# Patient Record
Sex: Female | Born: 1955
Health system: Southern US, Community
[De-identification: ages and names within clinical notes are randomized; demographics above are authoritative.]

## PROBLEM LIST (undated history)

## (undated) DIAGNOSIS — I471 Supraventricular tachycardia, unspecified: Secondary | ICD-10-CM

## (undated) DIAGNOSIS — Z8679 Personal history of other diseases of the circulatory system: Secondary | ICD-10-CM

## (undated) DIAGNOSIS — E785 Hyperlipidemia, unspecified: Secondary | ICD-10-CM

## (undated) HISTORY — DX: Personal history of other diseases of the circulatory system: Z86.79

## (undated) HISTORY — PX: OTHER SURGICAL HISTORY: SHX169

## (undated) HISTORY — DX: Supraventricular tachycardia: I47.1

## (undated) HISTORY — PX: AUGMENTATION MAMMAPLASTY: SUR837

## (undated) HISTORY — PX: OVARIAN CYST REMOVAL: SHX89

## (undated) HISTORY — PX: TONSILLECTOMY: SUR1361

## (undated) HISTORY — DX: Hyperlipidemia, unspecified: E78.5

## (undated) HISTORY — PX: BREAST ENHANCEMENT SURGERY: SHX7

## (undated) HISTORY — PX: APPENDECTOMY: SHX54

## (undated) HISTORY — DX: Supraventricular tachycardia, unspecified: I47.10

---

## 2005-11-05 ENCOUNTER — Other Ambulatory Visit: Payer: Self-pay

## 2005-11-05 ENCOUNTER — Observation Stay: Payer: Self-pay | Admitting: Internal Medicine

## 2006-02-21 ENCOUNTER — Ambulatory Visit: Payer: Self-pay | Admitting: Gastroenterology

## 2007-08-21 ENCOUNTER — Other Ambulatory Visit: Payer: Self-pay

## 2007-08-21 ENCOUNTER — Inpatient Hospital Stay: Payer: Self-pay | Admitting: Unknown Physician Specialty

## 2008-01-07 ENCOUNTER — Ambulatory Visit: Payer: Self-pay | Admitting: Unknown Physician Specialty

## 2009-03-07 ENCOUNTER — Ambulatory Visit: Payer: Self-pay | Admitting: Unknown Physician Specialty

## 2010-03-08 ENCOUNTER — Ambulatory Visit: Payer: Self-pay | Admitting: Unknown Physician Specialty

## 2011-05-23 ENCOUNTER — Ambulatory Visit: Payer: Self-pay | Admitting: Unknown Physician Specialty

## 2011-06-22 LAB — HM MAMMOGRAPHY: HM Mammogram: NORMAL

## 2011-08-20 ENCOUNTER — Encounter: Payer: Self-pay | Admitting: Internal Medicine

## 2011-08-20 ENCOUNTER — Ambulatory Visit (INDEPENDENT_AMBULATORY_CARE_PROVIDER_SITE_OTHER): Payer: BC Managed Care – PPO | Admitting: Internal Medicine

## 2011-08-20 DIAGNOSIS — E785 Hyperlipidemia, unspecified: Secondary | ICD-10-CM

## 2011-08-20 DIAGNOSIS — A09 Infectious gastroenteritis and colitis, unspecified: Secondary | ICD-10-CM

## 2011-08-20 DIAGNOSIS — R197 Diarrhea, unspecified: Secondary | ICD-10-CM

## 2011-08-20 DIAGNOSIS — R5383 Other fatigue: Secondary | ICD-10-CM

## 2011-08-20 DIAGNOSIS — R5381 Other malaise: Secondary | ICD-10-CM

## 2011-08-20 DIAGNOSIS — E860 Dehydration: Secondary | ICD-10-CM | POA: Insufficient documentation

## 2011-08-20 MED ORDER — AZITHROMYCIN 500 MG PO TABS: 500.0000 mg | ORAL_TABLET | Freq: Every day | ORAL | Status: DC

## 2011-08-20 MED ORDER — CIPROFLOXACIN HCL 500 MG PO TABS: 500.0000 mg | ORAL_TABLET | Freq: Two times a day (BID) | ORAL | Status: AC

## 2011-08-20 NOTE — Patient Instructions (Addendum)
Take the azithromycin if you develop another upper respiratory infection;  The ciprfloxacin if you develop signs of a urinary tract infection  Or diarrhea while on your trip  Consider a trial of Sudafed PE  10 to 30 mg every 6 hours for the next few days to decreased the congestion in your eustachian tubes and managed the innher ear symptoms you are currently having  You appear to be dehydrated; I recommend trying G2 (the blue, emerald green and red varieties are palatable)

## 2011-08-20 NOTE — Assessment & Plan Note (Signed)
Multiple etiologies discussed.  Checking TSH, CBC, CMET.  Discussed issue of depression as cause.

## 2011-08-20 NOTE — Progress Notes (Signed)
Subjective:    Patient ID: Melody Hansen, female    DOB: August 01, 1955, 56 y.o.   MRN: [id number]  HPI new patient .  Treated for pharyngitis 3 weeks ago by Elenore Rota initially with azithromycin followed by bronchitis/sinusitis with purulent secretions which resolved after a second course of antibioitcs,  Not sure which one was used.  After the 3 week illness she  recovered for one week, then developed GI illness with abdominal cramping accompanied by by loose grey stools initially 3 per day, with With subjective fevers and chills.  Diarrhea has now resolved after 5 to 6 days,  None for the past 24 hrs.  Has had yogurt, toast and peanut butter,  Some egg drop soup.  Melody Hansen was also recently sick with projectile vomiting , which she had none.  This is the First year she has had an influenza vaccine.  History of tonsillectomy.   Planning on going to [place] on Sunday and wants to make sure her GI issues are resolved . 2nd issue is right groin pain radiating to inner thigh .  No lower back pain ,  Present for about one week.  Aggravated  by sitting,  Lying on side  ,  But not aggravated by phsycial activities.   3rd issue is fatigue , which she feels predates her recent illnesses.  No history of diabetes, thyroid or anemia. Menopause at age 74.  No sleep disruption except once per night.  /  Colonoscopy at age 27 was normal.  Mammogram just done.  Was taking once weekly fosomax for osteopenia but stopped due to headaches. DEXAs were done at [facility].  Father died in 2023/03/15 and she was primary caregiver.    Past Medical History  Diagnosis Date  . Hyperlipidemia     familial  . Atrial fibrillation     Review of Systems  Constitutional: Positive for fatigue. Negative for fever, chills and unexpected weight change.  HENT: Negative for hearing loss, ear pain, nosebleeds, congestion, sore throat, facial swelling, rhinorrhea, sneezing, mouth sores, trouble swallowing, neck pain, neck stiffness, voice change,  postnasal drip, sinus pressure, tinnitus and ear discharge.   Eyes: Negative for pain, discharge, redness and visual disturbance.  Respiratory: Negative for cough, chest tightness, shortness of breath, wheezing and stridor.   Cardiovascular: Negative for chest pain, palpitations and leg swelling.  Gastrointestinal: Positive for diarrhea.  Musculoskeletal: Negative for myalgias and arthralgias.  Skin: Negative for color change and rash.  Neurological: Negative for dizziness, weakness, light-headedness and headaches.  Hematological: Negative for adenopathy.       Objective:   Physical Exam  Constitutional: She is oriented to person, place, and time. She appears well-developed and well-nourished.  HENT:  Mouth/Throat: Oropharynx is clear and moist.  Eyes: EOM are normal. Pupils are equal, round, and reactive to light. No scleral icterus.  Neck: Normal range of motion. Neck supple. No JVD present. No thyromegaly present.  Cardiovascular: Normal rate, regular rhythm, normal heart sounds and intact distal pulses.   Pulmonary/Chest: Effort normal and breath sounds normal.  Abdominal: Soft. Bowel sounds are normal. She exhibits no mass. There is no tenderness.  Musculoskeletal: Normal range of motion. She exhibits no edema.  Lymphadenopathy:    She has no cervical adenopathy.  Neurological: She is alert and oriented to person, place, and time.  Skin: Skin is warm and dry.  Psychiatric: She has a normal mood and affect.      Assessment & Plan:   Fatigue Multiple etiologies discussed.  Checking  TSH, CBC, CMET.  Discussed issue of depression as cause.   Dehydration By orthostatic vital signs, due to recent diarrheal illness.  Recommended G2 as rehydration .    Diarrhea in adult patient Presumed infectious, possible c dificile given prior use of antibiotics.  Now resolved x 24 hours, sent home with stool culture vials and c dificile  vial for testing.     Updated Medication  List Outpatient Encounter Prescriptions as of 08/20/2011  Medication Sig Dispense Refill  . atorvastatin (LIPITOR) 10 MG tablet Take 10 mg by mouth daily.      Marland Kitchen diltiazem (CARDIZEM) 60 MG tablet Take 30 mg by mouth as needed.      Marland Kitchen azithromycin (ZITHROMAX) 500 MG tablet Take 1 tablet (500 mg total) by mouth daily.  7 tablet  0  . ciprofloxacin (CIPRO) 500 MG tablet Take 1 tablet (500 mg total) by mouth 2 (two) times daily.  14 tablet  0

## 2011-08-20 NOTE — Assessment & Plan Note (Signed)
By orthostatic vital signs, due to recent diarrheal illness.  Recommended G2 as rehydration .

## 2011-08-20 NOTE — Assessment & Plan Note (Signed)
Presumed infectious, possible c dificile given prior use of antibiotics.  Now resolved x 24 hours, sent home with stool culture vials and c dificile  vial for testing.

## 2011-08-21 LAB — COMPLETE METABOLIC PANEL WITH GFR
BUN: 15 mg/dL (ref 6–23)
CO2: 27 mEq/L (ref 19–32)
Calcium: 9.7 mg/dL (ref 8.4–10.5)
Chloride: 105 mEq/L (ref 96–112)
Creat: 0.51 mg/dL (ref 0.50–1.10)
GFR, Est African American: >89 mL/min
GFR, Est Non African American: >89 mL/min
Glucose, Bld: 93 mg/dL (ref 70–99)
Total Bilirubin: 0.4 mg/dL (ref 0.3–1.2)

## 2011-08-21 LAB — CBC WITH DIFFERENTIAL/PLATELET
Basophils Absolute: 0 10*3/uL (ref 0.0–0.1)
Eosinophils Relative: 2.2 % (ref 0.0–5.0)
HCT: 35.1 % — ABNORMAL LOW (ref 36.0–46.0)
Lymphs Abs: 1.6 10*3/uL (ref 0.7–4.0)
Monocytes Absolute: 0.3 10*3/uL (ref 0.1–1.0)
Monocytes Relative: 7.2 % (ref 3.0–12.0)
Neutrophils Relative %: 53.5 % (ref 43.0–77.0)
Platelets: 180 10*3/uL (ref 150.0–400.0)
RDW: 13.5 % (ref 11.5–14.6)
WBC: 4.4 10*3/uL — ABNORMAL LOW (ref 4.5–10.5)

## 2011-08-21 LAB — TSH: TSH: 0.55 u[IU]/mL (ref 0.35–5.50)

## 2011-08-22 NOTE — Progress Notes (Signed)
Addended by: Darletta Moll A on: 08/22/2011 10:57 AM   Modules accepted: Orders

## 2011-09-02 ENCOUNTER — Encounter: Payer: Self-pay | Admitting: Internal Medicine

## 2011-09-12 ENCOUNTER — Ambulatory Visit: Payer: Self-pay | Admitting: Internal Medicine

## 2011-09-30 ENCOUNTER — Emergency Department: Payer: Self-pay | Admitting: Emergency Medicine

## 2011-09-30 LAB — COMPREHENSIVE METABOLIC PANEL
Albumin: 4.2 g/dL (ref 3.4–5.0)
Alkaline Phosphatase: 57 U/L (ref 50–136)
Anion Gap: 9 (ref 7–16)
BUN: 13 mg/dL (ref 7–18)
Calcium, Total: 9 mg/dL (ref 8.5–10.1)
EGFR (African American): >60
EGFR (Non-African Amer.): >60
Glucose: 88 mg/dL (ref 65–99)
Osmolality: 286 (ref 275–301)
Potassium: 4 mmol/L (ref 3.5–5.1)
SGOT(AST): 24 U/L (ref 15–37)
SGPT (ALT): 25 U/L
Sodium: 144 mmol/L (ref 136–145)
Total Protein: 7 g/dL (ref 6.4–8.2)

## 2011-09-30 LAB — CK TOTAL AND CKMB (NOT AT ARMC)
CK, Total: 152 U/L (ref 21–215)
CK-MB: 6.3 ng/mL — ABNORMAL HIGH (ref 0.5–3.6)

## 2011-09-30 LAB — APTT: Activated PTT: 31.9 secs (ref 23.6–35.9)

## 2011-09-30 LAB — CBC
HCT: 36.6 % (ref 35.0–47.0)
MCHC: 33.3 g/dL (ref 32.0–36.0)
Platelet: 213 10*3/uL (ref 150–440)

## 2011-09-30 LAB — TROPONIN I
Troponin-I: 0.05 ng/mL
Troponin-I: 0.1 ng/mL — ABNORMAL HIGH

## 2011-10-01 ENCOUNTER — Ambulatory Visit (INDEPENDENT_AMBULATORY_CARE_PROVIDER_SITE_OTHER): Payer: BC Managed Care – PPO | Admitting: Cardiovascular Disease

## 2011-10-01 ENCOUNTER — Encounter: Payer: Self-pay | Admitting: Cardiovascular Disease

## 2011-10-01 VITALS — BP 108/72 | HR 77 | Ht 64.0 in | Wt 129.0 lb

## 2011-10-01 DIAGNOSIS — I4891 Unspecified atrial fibrillation: Secondary | ICD-10-CM

## 2011-10-01 DIAGNOSIS — R079 Chest pain, unspecified: Secondary | ICD-10-CM | POA: Insufficient documentation

## 2011-10-01 DIAGNOSIS — I471 Supraventricular tachycardia: Secondary | ICD-10-CM | POA: Insufficient documentation

## 2011-10-01 NOTE — Assessment & Plan Note (Signed)
The patient had an episode of supraventricular tachycardia yesterday which required an emergency room visit and termination with adenosine. Her episodes of SVT have been clear overall and an average she gets one prolonged episode every 2 years that requires termination with adenosine. She gets other minor episodes but these usually are self-limiting and did not require emergency room visits. 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 We discussed different management options including radiofrequency ablation, antiarrhythmic medications and possible observation. She does use diltiazem as needed which occasionally works but not all the time. She cannot take this medication on a regular basis due to her baseline hypotension. Given that her episodes are overall greater and not frequent, ablation and antiarrhythmic medications are likely not warranted at this time given that the risks likely outweigh the benefit. If her episodes become more frequent, I think ablation would be the better option. For now, I recommend an echocardiogram to make sure she does not have any structural heart abnormalities. One option in her situation would be trying "the pill in the pocket" approach with flecainide or propafenone. This is usually used for paroxysmal atrial fibrillation but does also work for SVT. We also discussed today the different vagal maneuver that can be attempted.

## 2011-10-01 NOTE — Assessment & Plan Note (Signed)
She had some chest discomfort yesterday in the setting of tachycardia with borderline elevated troponin. I suspect that was related to supply demand ischemia. However, she has family history of premature CAD. Thus, I recommend further evaluation with a treadmill stress test.

## 2011-10-01 NOTE — Progress Notes (Signed)
HPI  This is a very pleasant 56 year old female who is here today for urgent evaluation after an emergency room visit last evening. She has known history of paroxysmal supraventricular tachycardia which was diagnosed about 6 years ago. Since that time, she had 3 emergency room visits that required termination with adenosine. Her last episode was 2 years ago before presenting yesterday with tachycardia associated with chest discomfort and mild dyspnea. She also had significant pulsatile feeling in her neck. Her heart rate at home was around 165 beats per minute. In the emergency room her ECG showed narrow complex tachycardia with a heart rate of 158 beats per minute with nonspecific ST changes. She was given 6 mg of IV adenosine and converted to normal sinus rhythm. Her chest x-ray was unremarkable. Her initial troponin was negative but the second one was 0.05 which is basically the upper limits of normal. She does get occasional fluttering sensation in her heart which lasts only for a few minutes. These episodes happen an average of once every one or 2 months. She denies any excessive caffeine intake. 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 She does have family history of premature coronary artery disease. She did discuss her condition with Dr. Beverely Pace in [place] who is her brother-in-law.  Allergies  Allergen Reactions  . Penicillins Hives     Current Outpatient Prescriptions on File Prior to Visit  Medication Sig Dispense Refill  . atorvastatin (LIPITOR) 10 MG tablet Take 10 mg by mouth daily.      Marland Kitchen diltiazem (CARDIZEM) 60 MG tablet Take 30 mg by mouth as needed.         Past Medical History  Diagnosis Date  . Hyperlipidemia     familial  . Osteoporosis   . Hx of supraventricular tachycardia   . PSVT (paroxysmal supraventricular tachycardia)      Past Surgical History  Procedure Date  . Tonsillectomy   . Appendectomy   . Ovarian cyst removal   . Removal  calcification right sternoclavicular joint   . Breast enhancement surgery      Family History  Problem Relation Age of Onset  . Stroke Father   . Heart disease Father   . Hyperlipidemia Father   . Heart attack Mother   . Hyperlipidemia      family history     History   Social History  . Marital Status: Married    Spouse Name: N/A    Number of Children: N/A  . Years of Education: N/A   Occupational History  . Not on file.   Social History Main Topics  . Smoking status: Never Smoker   . Smokeless tobacco: Never Used  . Alcohol Use: Yes  . Drug Use: No  . Sexually Active: Not on file   Other Topics Concern  . Not on file   Social History Narrative  . No narrative on file     ROS Constitutional: Negative for fever, chills, diaphoresis, activity change, appetite change and fatigue.  HENT: Negative for hearing loss, nosebleeds, congestion, sore throat, facial swelling, drooling, trouble swallowing, neck pain, voice change, sinus pressure and tinnitus.  Eyes: Negative for photophobia, pain, discharge and visual disturbance.  Respiratory: Negative for apnea wheezing.  Cardiovascular: Negative for  leg swelling.  Gastrointestinal: Negative for nausea, vomiting, abdominal pain, diarrhea, constipation, blood in stool and abdominal distention.  Genitourinary: Negative for dysuria, urgency, frequency, hematuria and decreased urine volume.  Musculoskeletal: Negative for myalgias, back pain,  joint swelling, arthralgias and gait problem.  Skin: Negative for color change, pallor, rash and wound.  Neurological: Negative for dizziness, tremors, seizures, syncope, speech difficulty, weakness, light-headedness, numbness and headaches.  Psychiatric/Behavioral: Negative for suicidal ideas, hallucinations, behavioral problems and agitation. The patient is not nervous/anxious.     PHYSICAL EXAM   BP 108/72  Pulse 77  Ht 5\' 4"  (1.626 m)  Wt 129 lb (58.514 kg)  BMI 22.14  kg/m2  Constitutional: She is oriented to person, place, and time. She appears well-developed and well-nourished. No distress.  HENT: No nasal discharge.  Head: Normocephalic and atraumatic.  Eyes: Pupils are equal and round. Right eye exhibits no discharge. Left eye exhibits no discharge.  Neck: Normal range of motion. Neck supple. No JVD present. No thyromegaly present.  Cardiovascular: Normal rate, regular rhythm, normal heart sounds. Exam reveals no gallop and no friction rub. No murmur heard.  Pulmonary/Chest: Effort normal and breath sounds normal. No stridor. No respiratory distress. She has no wheezes. She has no rales. She exhibits no tenderness.  Abdominal: Soft. Bowel sounds are normal. She exhibits no distension. There is no tenderness. There is no rebound and no guarding.  Musculoskeletal: Normal range of motion. She exhibits no edema and no tenderness.  Neurological: She is alert and oriented to person, place, and time. Coordination normal.  Skin: Skin is warm and dry. No rash noted. She is not diaphoretic. No erythema. No pallor.  Psychiatric: She has a normal mood and affect. Her behavior is normal. Judgment and thought content normal.     EKG: Normal sinus rhythm, possible right atrial enlargement. Normal PR and QT intervals. RSR' in V1 and V2.   ASSESSMENT AND PLAN

## 2011-10-01 NOTE — Patient Instructions (Signed)
Need to have a GXT in the [place] office.  Your physician has requested that you have an echocardiogram. Echocardiography is a painless test that uses sound waves to create images of your heart. It provides your doctor with information about the size and shape of your heart and how well your heart's chambers and valves are working. This procedure takes approximately one hour. There are no restrictions for this procedure.  Need to have Echo in the [place] office.   Follow up after the test with Dr. Kirke Corin.

## 2011-10-04 ENCOUNTER — Encounter: Payer: Self-pay | Admitting: Cardiovascular Disease

## 2011-10-04 ENCOUNTER — Ambulatory Visit (INDEPENDENT_AMBULATORY_CARE_PROVIDER_SITE_OTHER): Payer: BC Managed Care – PPO | Admitting: Cardiovascular Disease

## 2011-10-04 VITALS — BP 102/62 | Ht 64.0 in | Wt 128.0 lb

## 2011-10-04 DIAGNOSIS — I4891 Unspecified atrial fibrillation: Secondary | ICD-10-CM

## 2011-10-04 NOTE — Progress Notes (Signed)
    Treadmill Stress test  Indication: atypical chest pain.  Baseline Data:  Resting EKG shows NSR with rate of 64 bpm, no significant ST or T wave changes. Resting blood pressure of 102/62 mm Hg Stand bruce protocal was used.  Exercise Data:  Patient exercised for 9 min 0 sec,  Peak heart rate of 149 bpm.  This was 92 % of the maximum predicted heart rate. No symptoms of chest pain or lightheadedness were reported at peak stress or in recovery.  Peak Blood pressure recorded was 138/74 Maximal work level: 10.1 METs.  Heart rate at 3 minutes in recovery was 86 bpm. BP response: Normal HR response: Normal  EKG with Exercise: Sinus tachycardia with no significant ST changes  FINAL IMPRESSION: Normal exercise stress test. No significant EKG changes concerning for ischemia. Very good exercise tolerance. Low risk Duke treadmill score of 9.  No evidence of exercise induced arrhythmia.

## 2011-10-08 ENCOUNTER — Other Ambulatory Visit: Payer: Self-pay

## 2011-10-08 ENCOUNTER — Other Ambulatory Visit (INDEPENDENT_AMBULATORY_CARE_PROVIDER_SITE_OTHER): Payer: BC Managed Care – PPO

## 2011-10-08 DIAGNOSIS — I4891 Unspecified atrial fibrillation: Secondary | ICD-10-CM

## 2011-10-08 DIAGNOSIS — I498 Other specified cardiac arrhythmias: Secondary | ICD-10-CM

## 2011-10-15 ENCOUNTER — Ambulatory Visit: Payer: BC Managed Care – PPO | Admitting: Cardiovascular Disease

## 2011-10-22 ENCOUNTER — Encounter: Payer: Self-pay | Admitting: Cardiovascular Disease

## 2011-10-22 ENCOUNTER — Ambulatory Visit (INDEPENDENT_AMBULATORY_CARE_PROVIDER_SITE_OTHER): Payer: BC Managed Care – PPO | Admitting: Cardiovascular Disease

## 2011-10-22 VITALS — BP 104/64 | HR 79 | Ht 64.0 in | Wt 129.8 lb

## 2011-10-22 DIAGNOSIS — I471 Supraventricular tachycardia: Secondary | ICD-10-CM

## 2011-10-22 DIAGNOSIS — R079 Chest pain, unspecified: Secondary | ICD-10-CM

## 2011-10-22 NOTE — Assessment & Plan Note (Signed)
Treadmill stress test was normal. No further chest pain.

## 2011-10-22 NOTE — Progress Notes (Signed)
    HPI  This is a 56 year old female who is here today for a followup visit. She has known history of paroxysmal supraventricular tachycardia. She underwent evaluation with an echocardiogram which basically was normal. She did complain of an atypical chest pain and underwent a treadmill stress test which was also normal and showed no evidence of exercise induced arrhythmia. Overall she is feeling well. She has not had any further episodes of tachycardia. No more chest pain.  Allergies  Allergen Reactions  . Penicillins Hives     Current Outpatient Prescriptions on File Prior to Visit  Medication Sig Dispense Refill  . atorvastatin (LIPITOR) 10 MG tablet Take 10 mg by mouth daily.         Past Medical History  Diagnosis Date  . Hyperlipidemia     familial  . Osteoporosis   . Hx of supraventricular tachycardia   . PSVT (paroxysmal supraventricular tachycardia)      Past Surgical History  Procedure Date  . Tonsillectomy   . Appendectomy   . Ovarian cyst removal   . Removal calcification right sternoclavicular joint   . Breast enhancement surgery      Family History  Problem Relation Age of Onset  . Stroke Father   . Heart disease Father   . Hyperlipidemia Father   . Heart attack Mother   . Hyperlipidemia      family history     History   Social History  . Marital Status: Married    Spouse Name: N/A    Number of Children: N/A  . Years of Education: N/A   Occupational History  . Not on file.   Social History Main Topics  . Smoking status: Never Smoker   . Smokeless tobacco: Never Used  . Alcohol Use: Yes     occassional  . Drug Use: No  . Sexually Active: Not on file   Other Topics Concern  . Not on file   Social History Narrative  . No narrative on file     PHYSICAL EXAM   BP 104/64  Pulse 79  Ht 5\' 4"  (1.626 m)  Wt 129 lb 12 oz (58.854 kg)  BMI 22.27 kg/m2  Constitutional: She is oriented to person, place, and time. She appears  well-developed and well-nourished. No distress.  HENT: No nasal discharge.  Head: Normocephalic and atraumatic.  Eyes: Pupils are equal and round. Right eye exhibits no discharge. Left eye exhibits no discharge.  Neck: Normal range of motion. Neck supple. No JVD present. No thyromegaly present.  Cardiovascular: Normal rate, regular rhythm, normal heart sounds. Exam reveals no gallop and no friction rub. No murmur heard.  Pulmonary/Chest: Effort normal and breath sounds normal. No stridor. No respiratory distress. She has no wheezes. She has no rales. She exhibits no tenderness.  Abdominal: Soft. Bowel sounds are normal. She exhibits no distension. There is no tenderness. There is no rebound and no guarding.  Musculoskeletal: Normal range of motion. She exhibits no edema and no tenderness.  Neurological: She is alert and oriented to person, place, and time. Coordination normal.  Skin: Skin is warm and dry. No rash noted. She is not diaphoretic. No erythema. No pallor.  Psychiatric: She has a normal mood and affect. Her behavior is normal. Judgment and thought content normal.      ASSESSMENT AND PLAN

## 2011-10-22 NOTE — Assessment & Plan Note (Signed)
No further episodes since her last visit. Currently, her episodes are very rare and an average she gets one prolonged episode every 2 years. All of these episodes happened in spring time. At this time I think it's reasonable to continue vagal maneuvers as needed as well as diltiazem as needed. If these episodes become more frequent, an antiarrhythmic medication or ablation can be considered.

## 2011-10-22 NOTE — Patient Instructions (Signed)
Your heart tests were fine.  Follow up in 1 year or earlier if need.

## 2012-03-02 ENCOUNTER — Telehealth: Payer: Self-pay | Admitting: Internal Medicine

## 2012-03-02 ENCOUNTER — Ambulatory Visit (INDEPENDENT_AMBULATORY_CARE_PROVIDER_SITE_OTHER): Payer: BC Managed Care – PPO | Admitting: Internal Medicine

## 2012-03-02 ENCOUNTER — Encounter: Payer: Self-pay | Admitting: Internal Medicine

## 2012-03-02 VITALS — BP 100/60 | HR 80 | Temp 98.5°F | Resp 14 | Wt 131.2 lb

## 2012-03-02 DIAGNOSIS — M546 Pain in thoracic spine: Secondary | ICD-10-CM

## 2012-03-02 MED ORDER — ESOMEPRAZOLE MAGNESIUM 40 MG PO CPDR: 40.0000 mg | DELAYED_RELEASE_CAPSULE | Freq: Every day | ORAL | Status: DC

## 2012-03-02 MED ORDER — DICLOFENAC EPOLAMINE 1.3 % TD PTCH: 1.0000 | MEDICATED_PATCH | Freq: Two times a day (BID) | TRANSDERMAL | Status: DC

## 2012-03-02 NOTE — Progress Notes (Signed)
Patient ID: Melody Hansen, female   DOB: 06-16-1956, 56 y.o.   MRN: [id number]  Patient Active Problem List  Diagnosis  . Hyperlipidemia  . Fatigue  . Dehydration  . Diarrhea in adult patient  . PSVT (paroxysmal supraventricular tachycardia)  . Chest pain  . Back pain, thoracic    Subjective:  CC:   Chief Complaint  Patient presents with  . Abdominal Pain    HPI:   Melody Hansen a 56 y.o. female who presents  With a 2 day history of dull back pain on the left for left rib with occasional radiation to her left lower quadrant. They started after spending 8 hours in the car on Friday and again on Sunday driving to [facility] for her high school reunion. He was preceded by a fall at which time she suffered abrasion to right knee. At her today while dancing. Her pain is aggravated with prolonged sitting is better when she lies down. It is almost like gas pains. She denies any pleuritic component to it. She's noticed any blood in her in her stools or in her urine. She's had no lower extremity edema. She has not tried treating it with ice or heat or rest nor has she tried anti-inflammatory.  Past Medical History  Diagnosis Date  . Hyperlipidemia     familial  . Osteoporosis   . Hx of supraventricular tachycardia   . PSVT (paroxysmal supraventricular tachycardia)     Past Surgical History  Procedure Date  . Tonsillectomy   . Appendectomy   . Ovarian cyst removal   . Removal calcification right sternoclavicular joint   . Breast enhancement surgery          The following portions of the patient's history were reviewed and updated as appropriate: Allergies, current medications, and problem list.    Review of Systems:   A comprehensive ROS was done and positive for back pain .   The rest was negative.   History   Social History  . Marital Status: Married    Spouse Name: N/A    Number of Children: N/A  . Years of Education: N/A   Occupational History  . Not  on file.   Social History Main Topics  . Smoking status: Never Smoker   . Smokeless tobacco: Never Used  . Alcohol Use: Yes     occassional  . Drug Use: No  . Sexually Active: Not on file   Other Topics Concern  . Not on file   Social History Narrative  . No narrative on file    Objective:  BP 100/60  Pulse 80  Temp 98.5 F (36.9 C) (Oral)  Resp 14  Wt 131 lb 4 oz (59.535 kg)  SpO2 94%  General appearance: alert, cooperative and appears stated age Ears: normal TM's and external ear canals both ears Throat: lips, mucosa, and tongue normal; teeth and gums normal Neck: no adenopathy, no carotid bruit, supple, symmetrical, trachea midline and thyroid not enlarged, symmetric, no tenderness/mass/nodules Back: symmetric, no curvature. ROM normal. No CVA tenderness. Lungs: clear to auscultation bilaterally Heart: regular rate and rhythm, S1, S2 normal, no murmur, click, rub or gallop Abdomen: soft, non-tender; bowel sounds normal; no masses,  no organomegaly Pulses: 2+ and symmetric Skin: Skin color, texture, turgor normal. No rashes or lesions Lymph nodes: Cervical, supraclavicular, and axillary nodes normal.  Assessment and Plan:  Back pain, thoracic She has paraspinous muscle spasm on the left side and tenderness to palpation. Suspect her symptoms are  due to muscle strain from combination of abnormal activities including a fall dancing and a prolonged car ride. Trial of anti-inflammatory patch since she has had some gastritis with use of Aleve. Urinalysis was drawn today but was not run would like to rule out kidney stones well.   Updated Medication List Outpatient Encounter Prescriptions as of 03/02/2012  Medication Sig Dispense Refill  . atorvastatin (LIPITOR) 10 MG tablet Take 10 mg by mouth daily.      . diclofenac (FLECTOR) 1.3 % PTCH Place 1 patch onto the skin 2 (two) times daily.  30 patch  0  . esomeprazole (NEXIUM) 40 MG capsule Take 1 capsule (40 mg total) by  mouth daily.  15 capsule  3     Orders Placed This Encounter  Procedures  . Urinalysis    Return in about 1 month (around 04/01/2012).

## 2012-03-02 NOTE — Telephone Encounter (Signed)
Caller: Temia/Patient; Patient Name: Melody Hansen; PCP: Duncan Dull (Adults only); Best Callback Phone Number: [phone]; Reason for call: Abdominal pain on the left side, under her ribs that began on Sunday 9/15 at 2pm,  and persists now. Feels like gas pain. Afebrile. She is able to eat and denies vomiting, diarrhea and has had a bowel movement today. Some nausea after lunch of a salad. Discomfort at 5-6/10 now. Per Abdominal Pain Protocol, New onset constant mild or aching lower abdominal pain, See in 24 hours. RN spoke with Morrie Sheldon at back line re blocked slot at 15:30 with Dr Darrick Huntsman. Slot unblocked and scheduled for this patient for today, Monday 9/16 at 15:30. Caller is agreeable.

## 2012-03-02 NOTE — Patient Instructions (Addendum)
I am prescribing a antiinflammatory patch to place over the painful area on your back.  Change every 12 hours  Take the nexium daily for your stomach.  If yo do to improve in a week, let me know.  This is  Dr. Norton Blizzard version of a  "Low GI"  Diet:  All of the foods can be found at grocery stores and in bulk at [employer].  The Atkins protein bars and shakes are available in more varieties at [employer], [employer] and [employer].     7 AM Breakfast:  Low carbohydrate Protein  Shakes (I recommend the EAS AdvantEdge "Carb Control" shakes  Or the low carb shakes by Atkins.   Both are available everywhere:  In  cases at [employer]Js  Or in 4 packs at grocery stores and pharmacies  2.5 carbs  (Alternative is  a toasted Arnold's Sandwhich Thin w/ peanut butter, a "Bagel Thin" with cream cheese and salmon) or  a scrambled egg burrito made with a low carb tortilla .  Avoid cereal and bananas, oatmeal too unless you are cooking the old fashioned kind that takes 30-40 minutes to prepare.  the rest is overly processed, has minimal fiber, and is loaded with carbohydrates!   10 AM: Protein bar by Atkins (the snack size, under 200 cal).  There are many varieties , available widely again or in bulk in limited varieties at [employer]Js)  Other so called "protein bars" tend to be loaded with carbohydrates.  Remember, in food advertising, the word "energy" is synonymous for " carbohydrate."  Lunch: sandwich of [place], (or any lunchmeat, grilled meat or canned tuna), fresh avocado, mayonnaise  and cheese on a lower carbohydrate pita bread, flatbread, or tortilla . Ok to use regular mayonnaise. The bread is the only source or carbohydrate that can be decreased (Joseph's makes a pita bread and a flat bread that are 50 cal and 4 net carbs ; Toufayan makes a low carb flatbread that's 100 cal and 9 net carbs  and  Mission makes a low carb whole wheat tortilla  That is 210 cal and 6 net carbs)  3 PM:  Mid day :  Another protein bar,  Or a  cheese  stick (100 cal, 0 carbs),  Or 1 ounce of  almonds, walnuts, pistachios, pecans, peanuts,  Macadamia nuts. Or a Dannon light n Fit greek yogurt, 80 cal 8 net carbs . Avoid "granola"; the dried cranberries and raisins are loaded with carbohydrates. Mixed nuts ok if no raisins or cranberries or dried fruit.      6 PM  Dinner:  "mean and green:"  Meat/chicken/fish or a high protein legume; , with a green salad, and a low GI  Veggie (broccoli, cauliflower, green beans, spinach, brussel sprouts. Lima beans) : Avoid "Low fat dressings, as well as Reyne Dumas and [address]! They are loaded with sugar! Instead use ranch, vinagrette,  Blue cheese, etc  9 PM snack : Breyer's "low carb" fudgsicle or  ice cream bar (Carb Smart line), or  Weight Watcher's ice cream bar , or another "no sugar added" ice cream;a serving of fresh berries/cherries with whipped cream (Avoid bananas, pineapple, grapes  and watermelon on a regular basis because they are high in sugar)   Remember that snack Substitutions should be less than 15 to 20 carbs  Per serving. Remember to subtract fiber grams and sugar alcohols to get the "net carbs."

## 2012-03-02 NOTE — Assessment & Plan Note (Signed)
She has paraspinous muscle spasm on the left side and tenderness to palpation. Suspect her symptoms are due to muscle strain from combination of abnormal activities including a fall dancing and a prolonged car ride. Trial of anti-inflammatory patch since she has had some gastritis with use of Aleve. Urinalysis was drawn today but was not run would like to rule out kidney stones well.

## 2012-03-03 LAB — URINALYSIS
Nitrite: NEGATIVE
Specific Gravity, Urine: 1.025 (ref 1.000–1.030)
Total Protein, Urine: NEGATIVE

## 2012-04-07 ENCOUNTER — Ambulatory Visit: Payer: BC Managed Care – PPO | Admitting: Internal Medicine

## 2012-04-07 DIAGNOSIS — Z0289 Encounter for other administrative examinations: Secondary | ICD-10-CM

## 2012-10-12 ENCOUNTER — Ambulatory Visit (INDEPENDENT_AMBULATORY_CARE_PROVIDER_SITE_OTHER): Payer: BC Managed Care – PPO

## 2012-10-12 VITALS — BP 118/72 | HR 72 | Ht 64.0 in | Wt 128.5 lb

## 2012-10-12 DIAGNOSIS — R42 Dizziness and giddiness: Secondary | ICD-10-CM

## 2012-10-12 DIAGNOSIS — I471 Supraventricular tachycardia: Secondary | ICD-10-CM

## 2012-10-12 NOTE — Progress Notes (Signed)
Pt walked in office with c/o "heart fluttering" associated with dizziness. EKG was performed as well as orthostatics.  All values were WNL  Discussed with Dr. Kirke Corin who advised "order 2 week e cardio event monitor for pt and have her f/u after that" VO Dr. Adline Mango, RN  Pt informed Understanding verb

## 2012-10-12 NOTE — Patient Instructions (Addendum)
E Cardio event monitor  F/U in 2 weeks

## 2012-10-26 ENCOUNTER — Telehealth: Payer: Self-pay

## 2012-10-26 ENCOUNTER — Ambulatory Visit: Payer: BC Managed Care – PPO | Admitting: Cardiovascular Disease

## 2012-10-26 NOTE — Telephone Encounter (Signed)
Patient called to cancel appointment today since she is still wearing the Ecardio monitor until Oct 28, 2012. She would like to come when the monitor is finished. She states has not had any episodes and will just continue to monitor her heart and call the office if has any problems before her follow up with Dr. Kirke Corin in June.

## 2012-11-06 ENCOUNTER — Ambulatory Visit (INDEPENDENT_AMBULATORY_CARE_PROVIDER_SITE_OTHER): Payer: BC Managed Care – PPO

## 2012-11-06 ENCOUNTER — Encounter: Payer: Self-pay | Admitting: *Deleted

## 2012-11-06 DIAGNOSIS — I471 Supraventricular tachycardia: Secondary | ICD-10-CM

## 2012-11-06 DIAGNOSIS — R42 Dizziness and giddiness: Secondary | ICD-10-CM

## 2012-11-16 ENCOUNTER — Ambulatory Visit: Payer: BC Managed Care – PPO | Admitting: Cardiovascular Disease

## 2012-11-20 ENCOUNTER — Ambulatory Visit: Payer: BC Managed Care – PPO | Admitting: Cardiovascular Disease

## 2012-12-07 ENCOUNTER — Encounter: Payer: Self-pay | Admitting: Cardiovascular Disease

## 2012-12-07 ENCOUNTER — Ambulatory Visit (INDEPENDENT_AMBULATORY_CARE_PROVIDER_SITE_OTHER): Payer: BC Managed Care – PPO | Admitting: Cardiovascular Disease

## 2012-12-07 VITALS — BP 102/64 | HR 75 | Ht 64.0 in | Wt 127.8 lb

## 2012-12-07 DIAGNOSIS — I471 Supraventricular tachycardia: Secondary | ICD-10-CM

## 2012-12-07 DIAGNOSIS — I4902 Ventricular flutter: Secondary | ICD-10-CM

## 2012-12-07 NOTE — Assessment & Plan Note (Signed)
The patient seems to have episodes of tachycardia during springtime correlating with high allergy season with personal history of significant allergies during that time. Fortunately, these episodes are still not very frequent and not very long in duration. Recent outpatient telemetry showed no evidence of arrhythmia. I recommend continuing observation. No indication to start a medication at this point.

## 2012-12-07 NOTE — Patient Instructions (Addendum)
Follow up as needed

## 2012-12-07 NOTE — Progress Notes (Signed)
HPI  This is a 57 year old female who is here today for a followup visit. She has known history of paroxysmal supraventricular tachycardia. She underwent evaluation with an echocardiogram in 2013 which  was normal. Treadmill stress test was done also due to atypical chest pain which was normal and showed no evidence of exercise induced arrhythmia. She stopped by our office in April due to an episode of palpitations and dizziness. By the time she arrived, she was feeling better and EKG showed normal sinus rhythm. I requested a two-week outpatient telemetry which showed no evidence of arrhythmia.  Allergies  Allergen Reactions  . Penicillins Hives     Current Outpatient Prescriptions on File Prior to Visit  Medication Sig Dispense Refill  . Loratadine-Pseudoephedrine (ALAVERT ALLERGY/SINUS PO) Take by mouth daily.       No current facility-administered medications on file prior to visit.     Past Medical History  Diagnosis Date  . Hyperlipidemia     familial  . Osteoporosis   . Hx of supraventricular tachycardia   . PSVT (paroxysmal supraventricular tachycardia)      Past Surgical History  Procedure Laterality Date  . Tonsillectomy    . Appendectomy    . Ovarian cyst removal    . Removal calcification right sternoclavicular joint    . Breast enhancement surgery       Family History  Problem Relation Age of Onset  . Stroke Father   . Heart disease Father   . Hyperlipidemia Father   . Heart attack Mother   . Hyperlipidemia      family history     History   Social History  . Marital Status: Married    Spouse Name: N/A    Number of Children: N/A  . Years of Education: N/A   Occupational History  . Not on file.   Social History Main Topics  . Smoking status: Never Smoker   . Smokeless tobacco: Never Used  . Alcohol Use: Yes     Comment: occassional  . Drug Use: No  . Sexually Active: Not on file   Other Topics Concern  . Not on file   Social  History Narrative  . No narrative on file     PHYSICAL EXAM   BP 102/64  Pulse 75  Ht 5\' 4"  (1.626 m)  Wt 127 lb 12 oz (57.947 kg)  BMI 21.92 kg/m2  Constitutional: She is oriented to person, place, and time. She appears well-developed and well-nourished. No distress.  HENT: No nasal discharge.  Head: Normocephalic and atraumatic.  Eyes: Pupils are equal and round. Right eye exhibits no discharge. Left eye exhibits no discharge.  Neck: Normal range of motion. Neck supple. No JVD present. No thyromegaly present.  Cardiovascular: Normal rate, regular rhythm, normal heart sounds. Exam reveals no gallop and no friction rub. No murmur heard.  Pulmonary/Chest: Effort normal and breath sounds normal. No stridor. No respiratory distress. She has no wheezes. She has no rales. She exhibits no tenderness.  Abdominal: Soft. Bowel sounds are normal. She exhibits no distension. There is no tenderness. There is no rebound and no guarding.  Musculoskeletal: Normal range of motion. She exhibits no edema and no tenderness.  Neurological: She is alert and oriented to person, place, and time. Coordination normal.  Skin: Skin is warm and dry. No rash noted. She is not diaphoretic. No erythema. No pallor.  Psychiatric: She has a normal mood and affect. Her behavior is normal. Judgment and thought content  normal.    GNF:AOZHYQ sinus rhythm  ASSESSMENT AND PLAN

## 2013-07-01 ENCOUNTER — Ambulatory Visit (INDEPENDENT_AMBULATORY_CARE_PROVIDER_SITE_OTHER): Payer: BC Managed Care – PPO | Admitting: Physician Assistant

## 2013-07-01 ENCOUNTER — Encounter: Payer: Self-pay | Admitting: Physician Assistant

## 2013-07-01 VITALS — BP 110/80 | HR 176 | Ht 64.0 in | Wt 127.0 lb

## 2013-07-01 DIAGNOSIS — I471 Supraventricular tachycardia, unspecified: Secondary | ICD-10-CM

## 2013-07-01 MED ORDER — METOPROLOL TARTRATE 25 MG PO TABS: 25.0000 mg | ORAL_TABLET | ORAL | Status: DC | PRN

## 2013-07-01 NOTE — Assessment & Plan Note (Signed)
Recurrent episode of SVT today, spontaneously terminated. She did not attempt vagal maneuvers today. Educated on this again. Advised to reduce caffeine and EtOH intake. Will prescribe Lopressor 25mg  PRN for recurrent episodes. Discussed long term options. These episodes are infrequent; however, she does not tolerate them well. She is concerned that she may have a syncopal episode while driving in the future. Frequency may increase in the future. She has had several trips to the ED for adenosine, as vagal maneuvers had been ineffective in the past. She is interested in pursuing more definitive options such as catheter ablation. Have arranged for EP referral.

## 2013-07-01 NOTE — Progress Notes (Signed)
Patient ID: Taaliyah Delpriore, female   DOB: Mar 19, 1956, 58 y.o.   MRN: [id number]   Date:  07/01/2013   ID:  [name], DOB 1955-11-01, MRN [id number]  PCP:  Deborra Medina, MD  Primary Cardiologist:  Jerilynn Mages. Fletcher Anon, MD   History of Present Illness: Subrina Vecchiarelli is a 58 y.o. female w/ PMHx s/f PSVT who was seen as a work-in visit today.   She had an echocardiogram 2013 and ETT for atypical chest pain which were normal. No exercise-induced arrhythmias.   She has very infrequent paroxysms of SVT described as tachy-palpitations and lightheadedness which occur once every 6 months or so. She has tried vagal maneuvers in the past without success. She has had at least 3 trips to the ED for adenosine with termination. No rate-control agents have been prescribed given infrequency.   She was at a meeting (works next door) and experienced another episode of tachy-palpitations and lightheadedness c/w prior episodes. She presented to our office. An EKG revealed SVT at 150 bpm. Further tracings revealed rate increase to as high as 180s. She layed on the exam table and the rhythm terminated. Symptoms resolved.   She drinks 1/2 cup of coffee daily, 1 glass of wine daily. No increased stress.   EKG: supraventricular tachycardia/AVNRT, 150 bpm, no ST/T changes  Wt Readings from Last 3 Encounters:  07/01/13 127 lb (57.607 kg)  12/07/12 127 lb 12 oz (57.947 kg)  10/12/12 128 lb 8 oz (58.287 kg)     Past Medical History  Diagnosis Date  . Hyperlipidemia     familial  . Osteoporosis   . Hx of supraventricular tachycardia   . PSVT (paroxysmal supraventricular tachycardia)     Current Outpatient Prescriptions  Medication Sig Dispense Refill  . Loratadine-Pseudoephedrine (ALAVERT ALLERGY/SINUS PO) Take by mouth daily.      . Pseudoephedrine-Guaifenesin (MUCINEX D PO) Take by mouth daily.      . metoprolol tartrate (LOPRESSOR) 25 MG tablet Take 1 tablet (25 mg total) by mouth as needed (take 1  tablet as needed for heart palpitations, lightheadedness).  30 tablet  0   No current facility-administered medications for this visit.    Allergies:    Allergies  Allergen Reactions  . Penicillins Hives    Social History:  The patient  reports that she has never smoked. She has never used smokeless tobacco. She reports that she drinks alcohol. She reports that she does not use illicit drugs.   Family History:  Family History  Problem Relation Age of Onset  . Stroke Father   . Heart disease Father   . Hyperlipidemia Father   . Heart attack Mother   . Hyperlipidemia      family history    Review of Systems: General: negative for chills, fever, night sweats or weight changes.  Cardiovascular: positive for palpitations, negative for chest pain, dyspnea on exertion, edema, orthopnea, paroxysmal nocturnal dyspnea or shortness of breath Dermatological: negative for rash Respiratory: negative for cough or wheezing Urologic: negative for hematuria Abdominal: negative for nausea, vomiting, diarrhea, bright red blood per rectum, melena, or hematemesis Neurologic: positive for lightheadedness, negative for visual changes, syncope All other systems reviewed and are otherwise negative except as noted above.  PHYSICAL EXAM: VS:  BP 110/80  Pulse 176  Ht 5\' 4"  (1.626 m)  Wt 127 lb (57.607 kg)  BMI 21.79 kg/m2 Well nourished, well developed, in no acute distress HEENT: normal, PERRL Neck: no JVD or bruits Cardiac:  normal S1, S2; RRR; no murmur  or gallops Lungs:  clear to auscultation bilaterally, no wheezing, rhonchi or rales Abd: soft, nontender, no hepatomegaly, normoactive BS x 4 quads Ext: no edema, cyanosis or clubbing Skin: warm and dry, cap refill < 2 sec Neuro:  CNs 2-12 intact, no focal abnormalities noted Musculoskeletal: strength and tone appropriate for age  Psych: normal affect

## 2013-07-01 NOTE — Patient Instructions (Signed)
Please take metoprolol tartrate (Lopressor) 25mg , one tablet as needed for heart palpitations, lightheadedness.  We will contact our electrophysiology colleagues regarding a possible ablation procedure, if indicated .  Please reduce/limit caffeine and alcohol intake.

## 2013-07-15 ENCOUNTER — Ambulatory Visit (INDEPENDENT_AMBULATORY_CARE_PROVIDER_SITE_OTHER): Payer: BC Managed Care – PPO | Admitting: Internal Medicine

## 2013-07-15 ENCOUNTER — Encounter: Payer: Self-pay | Admitting: Internal Medicine

## 2013-07-15 VITALS — BP 98/64 | HR 71 | Ht 64.0 in | Wt 130.0 lb

## 2013-07-15 DIAGNOSIS — I471 Supraventricular tachycardia: Secondary | ICD-10-CM

## 2013-07-15 NOTE — Progress Notes (Signed)
HPI Melody Hansen is referred today by Dr. Fletcher Anon for evaluation of SVT. The patient is a very pleasant 58 year old woman with a long-standing history of tachycardia palpitations. She thinks that she has had SVT for over 5 years. Initially the episodes are more common in the springtime when she was bothered by seasonal  Allergic rhinitis. Over the last several months, she has had increasingly frequent episodes of tachycardia palpitations. These episodes have at times required emergency room visits and termination with intravenous adenosine. Medical therapy has been ineffective. At times she has experienced near syncope but never had frank syncope. And tachycardia, she feels shortness of breath. She has had a documented narrow QRS tachycardia at rates of up to 170 beats per minute. The episodes start and stop suddenly. Allergies  Allergen Reactions  . Penicillins Hives     Current Outpatient Prescriptions  Medication Sig Dispense Refill  . Loratadine-Pseudoephedrine (ALAVERT ALLERGY/SINUS PO) Take 1 tablet by mouth daily.       . metoprolol tartrate (LOPRESSOR) 25 MG tablet Take 25 mg by mouth as needed.       . Multiple Vitamin (MULTIVITAMIN) capsule Take 1 capsule by mouth daily.      . NON FORMULARY Herbal Life Shakes - Drinks one shake a day      . Pseudoephedrine-Guaifenesin (MUCINEX D PO) Take 1 tablet by mouth as needed.        No current facility-administered medications for this visit.     Past Medical History  Diagnosis Date  . Hyperlipidemia     familial  . Osteoporosis   . Hx of supraventricular tachycardia   . PSVT (paroxysmal supraventricular tachycardia)     ROS:   All systems reviewed and negative except as noted in the HPI.   Past Surgical History  Procedure Laterality Date  . Tonsillectomy    . Appendectomy    . Ovarian cyst removal    . Removal calcification right sternoclavicular joint    . Breast enhancement surgery       Family History    Problem Relation Age of Onset  . Stroke Father   . Heart disease Father   . Hyperlipidemia Father   . Heart attack Mother   . Hyperlipidemia      family history     History   Social History  . Marital Status: Married    Spouse Name: N/A    Number of Children: N/A  . Years of Education: N/A   Occupational History  . Not on file.   Social History Main Topics  . Smoking status: Never Smoker   . Smokeless tobacco: Never Used  . Alcohol Use: Yes     Comment: occassional  . Drug Use: No  . Sexual Activity: Not on file   Other Topics Concern  . Not on file   Social History Narrative  . No narrative on file     BP 98/64  Pulse 71  Ht 5\' 4"  (1.626 m)  Wt 130 lb (58.968 kg)  BMI 22.30 kg/m2  Physical Exam:  Well appearing middle-age woman, who looks younger than her stated age, and in NAD HEENT: Unremarkable Neck:  No JVD, no thyromegally Back:  No CVA tenderness Lungs:  Clear with no wheezes, rales, or rhonchi. HEART:  Regular rate rhythm, no murmurs, no rubs, no clicks Abd:  soft, positive bowel sounds, no organomegally, no rebound, no guarding Ext:  2 plus pulses, no edema, no cyanosis, no clubbing Skin:  No rashes no nodules Neuro:  CN II through XII intact, motor grossly intact  EKG - normal sinus rhythm with normal axis and intervals. There is no ventricular preexcitation.   Assess/Plan:

## 2013-07-15 NOTE — Assessment & Plan Note (Signed)
I have discussed the treatment options with the patient. The risk, goals, benefits, and expectations of catheter ablation of SVT have been discussed with the patient in detail. She is considering her options and will call us if she would like to proceed with catheter ablation.

## 2013-07-15 NOTE — Patient Instructions (Signed)
Your physician has recommended that you have an ablation. Catheter ablation is a medical procedure used to treat some cardiac arrhythmias (irregular heartbeats). During catheter ablation, a long, thin, flexible tube is put into a blood vessel in your groin (upper thigh), or neck. This tube is called an ablation catheter. It is then guided to your heart through the blood vessel. Radio frequency waves destroy small areas of heart tissue where abnormal heartbeats may cause an arrhythmia to start. Please see the instruction sheet given to you today.  Call Leonia Reader if you want to schedule  [phone]  Dates  2/9, 2/12, 2/16,2/18,2/23,2/25,3/11,3/12,3/16,3/17,3/19,3/23,3/24

## 2013-07-26 NOTE — Telephone Encounter (Deleted)
Pt called and states she needs information regarding the ablasion procedure. Please call. States" whatever we sent Dr. Lovena Le"

## 2013-07-26 NOTE — Telephone Encounter (Signed)
This encounter was created in error - please disregard.

## 2013-08-15 HISTORY — PX: CARDIAC ELECTROPHYSIOLOGY STUDY AND ABLATION: SHX1294

## 2014-02-17 ENCOUNTER — Encounter: Payer: Self-pay | Admitting: Internal Medicine

## 2014-02-17 ENCOUNTER — Ambulatory Visit (INDEPENDENT_AMBULATORY_CARE_PROVIDER_SITE_OTHER): Payer: BC Managed Care – PPO | Admitting: Internal Medicine

## 2014-02-17 VITALS — BP 98/62 | HR 63 | Temp 98.1°F | Wt 134.0 lb

## 2014-02-17 DIAGNOSIS — J069 Acute upper respiratory infection, unspecified: Secondary | ICD-10-CM

## 2014-02-17 MED ORDER — AZITHROMYCIN 250 MG PO TABS: ORAL_TABLET | ORAL | Status: DC

## 2014-02-17 NOTE — Progress Notes (Signed)
Pre visit review using our clinic review tool, if applicable. No additional management support is needed unless otherwise documented below in the visit note. 

## 2014-02-18 NOTE — Progress Notes (Signed)
HPI  Pt presents to the clinic today with c/o sore throat, cough and chest congestion. She reports this started 1 week ago. The cough is worse at night. She is coughing up some green mucous. She denies fever but has had chills and body aches. She has taken Mucinex OTC with little relief. She has no history of allergies or breathing problems. She has not had sick contacts that she is aware of.   Review of Systems      Past Medical History  Diagnosis Date  . Hyperlipidemia     familial  . Osteoporosis   . Hx of supraventricular tachycardia   . PSVT (paroxysmal supraventricular tachycardia)     Family History  Problem Relation Age of Onset  . Stroke Father   . Heart disease Father   . Hyperlipidemia Father   . Heart attack Mother   . Hyperlipidemia      family history    History   Social History  . Marital Status: Married    Spouse Name: N/A    Number of Children: N/A  . Years of Education: N/A   Occupational History  . Not on file.   Social History Main Topics  . Smoking status: Never Smoker   . Smokeless tobacco: Never Used  . Alcohol Use: Yes     Comment: occassional  . Drug Use: No  . Sexual Activity: Not on file   Other Topics Concern  . Not on file   Social History Narrative  . No narrative on file    Allergies  Allergen Reactions  . Penicillins Hives     Constitutional: Positive headache, fatigue enies fever or abrupt weight changes.  HEENT:  Positive sore throat. Denies eye redness, eye pain, pressure behind the eyes, facial pain, nasal congestion, ear pain, ringing in the ears, wax buildup, runny nose or bloody nose. Respiratory: Positive cough. Denies difficulty breathing or shortness of breath.  Cardiovascular: Denies chest pain, chest tightness, palpitations or swelling in the hands or feet.   No other specific complaints in a complete review of systems (except as listed in HPI above).  Objective:   BP 98/62  Pulse 63  Temp(Src) 98.1 F  (36.7 C) (Oral)  Wt 134 lb (60.782 kg)  SpO2 98% Wt Readings from Last 3 Encounters:  02/17/14 134 lb (60.782 kg)  07/15/13 130 lb (58.968 kg)  07/01/13 127 lb (57.607 kg)     General: Appears her stated age, well developed, well nourished in NAD. HEENT: Head: normal shape and size; Ears: Tm's gray and intact, normal light reflex; Nose: mucosa pink and moist, septum midline; Throat/Mouth:+ PND. Teeth present, mucosa erythematous and moist, no exudate noted, no lesions or ulcerations noted.  Neck: Mild cervical lymphadenopathy. Neck supple, trachea midline. No massses, lumps or thyromegaly present.  Cardiovascular: Normal rate and rhythm. S1,S2 noted.  No murmur, rubs or gallops noted. No JVD or BLE edema. No carotid bruits noted. Pulmonary/Chest: Normal effort and positive vesicular breath sounds. No respiratory distress. No wheezes, rales or ronchi noted.      Assessment & Plan:   Upper Respiratory Infection:  Get some rest and drink plenty of water Do salt water gargles for the sore throat eRx for Azithromax x 5 days Delsym OTC for cough  RTC as needed or if symptoms persist.

## 2014-02-18 NOTE — Patient Instructions (Addendum)
Upper Respiratory Infection, Adult An upper respiratory infection (URI) is also sometimes known as the common cold. The upper respiratory tract includes the nose, sinuses, throat, trachea, and bronchi. Bronchi are the airways leading to the lungs. Most people improve within 1 week, but symptoms can last up to 2 weeks. A residual cough may last even longer.  CAUSES Many different viruses can infect the tissues lining the upper respiratory tract. The tissues become irritated and inflamed and often become very moist. Mucus production is also common. A cold is contagious. You can easily spread the virus to others by oral contact. This includes kissing, sharing a glass, coughing, or sneezing. Touching your mouth or nose and then touching a surface, which is then touched by another person, can also spread the virus. SYMPTOMS  Symptoms typically develop 1 to 3 days after you come in contact with a cold virus. Symptoms vary from person to person. They may include:  Runny nose.  Sneezing.  Nasal congestion.  Sinus irritation.  Sore throat.  Loss of voice (laryngitis).  Cough.  Fatigue.  Muscle aches.  Loss of appetite.  Headache.  Low-grade fever. DIAGNOSIS  You might diagnose your own cold based on familiar symptoms, since most people get a cold 2 to 3 times a year. Your caregiver can confirm this based on your exam. Most importantly, your caregiver can check that your symptoms are not due to another disease such as strep throat, sinusitis, pneumonia, asthma, or epiglottitis. Blood tests, throat tests, and X-rays are not necessary to diagnose a common cold, but they may sometimes be helpful in excluding other more serious diseases. Your caregiver will decide if any further tests are required. RISKS AND COMPLICATIONS  You may be at risk for a more severe case of the common cold if you smoke cigarettes, have chronic heart disease (such as heart failure) or lung disease (such as asthma), or if  you have a weakened immune system. The very young and very old are also at risk for more serious infections. Bacterial sinusitis, middle ear infections, and bacterial pneumonia can complicate the common cold. The common cold can worsen asthma and chronic obstructive pulmonary disease (COPD). Sometimes, these complications can require emergency medical care and may be life-threatening. PREVENTION  The best way to protect against getting a cold is to practice good hygiene. Avoid oral or hand contact with people with cold symptoms. Wash your hands often if contact occurs. There is no clear evidence that vitamin C, vitamin E, echinacea, or exercise reduces the chance of developing a cold. However, it is always recommended to get plenty of rest and practice good nutrition. TREATMENT  Treatment is directed at relieving symptoms. There is no cure. Antibiotics are not effective, because the infection is caused by a virus, not by bacteria. Treatment may include:  Increased fluid intake. Sports drinks offer valuable electrolytes, sugars, and fluids.  Breathing heated mist or steam (vaporizer or shower).  Eating chicken soup or other clear broths, and maintaining good nutrition.  Getting plenty of rest.  Using gargles or lozenges for comfort.  Controlling fevers with ibuprofen or acetaminophen as directed by your caregiver.  Increasing usage of your inhaler if you have asthma. Zinc gel and zinc lozenges, taken in the first 24 hours of the common cold, can shorten the duration and lessen the severity of symptoms. Pain medicines may help with fever, muscle aches, and throat pain. A variety of non-prescription medicines are available to treat congestion and runny nose. Your caregiver   can make recommendations and may suggest nasal or lung inhalers for other symptoms.  HOME CARE INSTRUCTIONS   Only take over-the-counter or prescription medicines for pain, discomfort, or fever as directed by your  caregiver.  Use a warm mist humidifier or inhale steam from a shower to increase air moisture. This may keep secretions moist and make it easier to breathe.  Drink enough water and fluids to keep your urine clear or pale yellow.  Rest as needed.  Return to work when your temperature has returned to normal or as your caregiver advises. You may need to stay home longer to avoid infecting others. You can also use a face mask and careful hand washing to prevent spread of the virus. SEEK MEDICAL CARE IF:   After the first few days, you feel you are getting worse rather than better.  You need your caregiver's advice about medicines to control symptoms.  You develop chills, worsening shortness of breath, or brown or red sputum. These may be signs of pneumonia.  You develop yellow or brown nasal discharge or pain in the face, especially when you bend forward. These may be signs of sinusitis.  You develop a fever, swollen neck glands, pain with swallowing, or white areas in the back of your throat. These may be signs of strep throat. SEEK IMMEDIATE MEDICAL CARE IF:   You have a fever.  You develop severe or persistent headache, ear pain, sinus pain, or chest pain.  You develop wheezing, a prolonged cough, cough up blood, or have a change in your usual mucus (if you have chronic lung disease).  You develop sore muscles or a stiff neck. Document Released: 11/27/2000 Document Revised: 08/26/2011 Document Reviewed: 09/08/2013 [employer] Patient Information 2015 [employer]. This information is not intended to replace advice given to you by your health care provider. Make sure you discuss any questions you have with your health care provider.  

## 2014-06-27 ENCOUNTER — Telehealth: Payer: Self-pay | Admitting: Internal Medicine

## 2014-06-27 DIAGNOSIS — Z0000 Encounter for general adult medical examination without abnormal findings: Secondary | ICD-10-CM

## 2014-06-27 NOTE — Telephone Encounter (Signed)
Thanks

## 2014-06-27 NOTE — Telephone Encounter (Signed)
Needing lab orders before physical on 2.25.16

## 2014-06-27 NOTE — Telephone Encounter (Signed)
Labs in that you advised for PE.

## 2014-07-04 ENCOUNTER — Ambulatory Visit (INDEPENDENT_AMBULATORY_CARE_PROVIDER_SITE_OTHER): Payer: Self-pay | Admitting: Nurse Practitioner

## 2014-07-04 ENCOUNTER — Encounter: Payer: Self-pay | Admitting: Nurse Practitioner

## 2014-07-04 VITALS — BP 108/60 | HR 85 | Temp 97.1°F | Resp 12 | Ht 64.0 in | Wt 134.4 lb

## 2014-07-04 DIAGNOSIS — R0689 Other abnormalities of breathing: Secondary | ICD-10-CM

## 2014-07-04 DIAGNOSIS — B029 Zoster without complications: Secondary | ICD-10-CM

## 2014-07-04 MED ORDER — VALACYCLOVIR HCL 1 G PO TABS: 1000.0000 mg | ORAL_TABLET | Freq: Three times a day (TID) | ORAL | Status: DC

## 2014-07-04 NOTE — Progress Notes (Signed)
Pre visit review using our clinic review tool, if applicable. No additional management support is needed unless otherwise documented below in the visit note. 

## 2014-07-04 NOTE — Patient Instructions (Signed)
We will contact you to schedule your Pulmonary Function Tests.   Shingles Shingles (herpes zoster) is an infection that is caused by the same virus that causes chickenpox (varicella). The infection causes a painful skin rash and fluid-filled blisters, which eventually break open, crust over, and heal. It may occur in any area of the body, but it usually affects only one side of the body or face. The pain of shingles usually lasts about 1 month. However, some people with shingles may develop long-term (chronic) pain in the affected area of the body. Shingles often occurs many years after the person had chickenpox. It is more common:  In people older than 50 years.  In people with weakened immune systems, such as those with HIV, AIDS, or cancer.  In people taking medicines that weaken the immune system, such as transplant medicines.  In people under great stress. CAUSES  Shingles is caused by the varicella zoster virus (VZV), which also causes chickenpox. After a person is infected with the virus, it can remain in the person's body for years in an inactive state (dormant). To cause shingles, the virus reactivates and breaks out as an infection in a nerve root. The virus can be spread from person to person (contagious) through contact with open blisters of the shingles rash. It will only spread to people who have not had chickenpox. When these people are exposed to the virus, they may develop chickenpox. They will not develop shingles. Once the blisters scab over, the person is no longer contagious and cannot spread the virus to others. SIGNS AND SYMPTOMS  Shingles shows up in stages. The initial symptoms may be pain, itching, and tingling in an area of the skin. This pain is usually described as burning, stabbing, or throbbing.In a few days or weeks, a painful red rash will appear in the area where the pain, itching, and tingling were felt. The rash is usually on one side of the body in a band or  belt-like pattern. Then, the rash usually turns into fluid-filled blisters. They will scab over and dry up in approximately 2-3 weeks. Flu-like symptoms may also occur with the initial symptoms, the rash, or the blisters. These may include:  Fever.  Chills.  Headache.  Upset stomach. DIAGNOSIS  Your health care provider will perform a skin exam to diagnose shingles. Skin scrapings or fluid samples may also be taken from the blisters. This sample will be examined under a microscope or sent to a lab for further testing. TREATMENT  There is no specific cure for shingles. Your health care provider will likely prescribe medicines to help you manage the pain, recover faster, and avoid long-term problems. This may include antiviral drugs, anti-inflammatory drugs, and pain medicines. HOME CARE INSTRUCTIONS   Take a cool bath or apply cool compresses to the area of the rash or blisters as directed. This may help with the pain and itching.   Take medicines only as directed by your health care provider.   Rest as directed by your health care provider.  Keep your rash and blisters clean with mild soap and cool water or as directed by your health care provider.  Do not pick your blisters or scratch your rash. Apply an anti-itch cream or numbing creams to the affected area as directed by your health care provider.  Keep your shingles rash covered with a loose bandage (dressing).  Avoid skin contact with:  Babies.   Pregnant women.   Children with eczema.   Elderly  people with transplants.   People with chronic illnesses, such as leukemia or AIDS.   Wear loose-fitting clothing to help ease the pain of material rubbing against the rash.  Keep all follow-up visits as directed by your health care provider.If the area involved is on your face, you may receive a referral for a specialist, such as an eye doctor (ophthalmologist) or an ear, nose, and throat (ENT) doctor. Keeping all  follow-up visits will help you avoid eye problems, chronic pain, or disability.  SEEK IMMEDIATE MEDICAL CARE IF:   You have facial pain, pain around the eye area, or loss of feeling on one side of your face.  You have ear pain or ringing in your ear.  You have loss of taste.  Your pain is not relieved with prescribed medicines.   Your redness or swelling spreads.   You have more pain and swelling.  Your condition is worsening or has changed.   You have a fever. MAKE SURE YOU:  Understand these instructions.  Will watch your condition.  Will get help right away if you are not doing well or get worse. Document Released: 06/03/2005 Document Revised: 10/18/2013 Document Reviewed: 01/16/2012 [facility] Patient Information 2015 [place], [place]. This information is not intended to replace advice given to you by your health care provider. Make sure you discuss any questions you have with your health care provider.  Valacyclovir caplets What is this medicine? VALACYCLOVIR (val ay SYE kloe veer) is an antiviral medicine. It is used to treat or prevent infections caused by certain kinds of viruses. Examples of these infections include herpes and shingles. This medicine will not cure herpes. This medicine may be used for other purposes; ask your health care provider or pharmacist if you have questions. COMMON BRAND NAME(S): Valtrex What should I tell my health care provider before I take this medicine? They need to know if you have any of these conditions: -acquired immunodeficiency syndrome (AIDS) -any other condition that may weaken the immune system -bone marrow or kidney transplant -kidney disease -an unusual or allergic reaction to valacyclovir, acyclovir, ganciclovir, valganciclovir, other medicines, foods, dyes, or preservatives -pregnant or trying to get pregnant -breast-feeding How should I use this medicine? Take this medicine by mouth with a glass of water. Follow the  directions on the prescription label. You can take this medicine with or without food. Take your doses at regular intervals. Do not take your medicine more often than directed. Finish the full course prescribed by your doctor or health care professional even if you think your condition is better. Do not stop taking except on the advice of your doctor or health care professional. Talk to your pediatrician regarding the use of this medicine in children. While this drug may be prescribed for children as young as 2 years for selected conditions, precautions do apply. Overdosage: If you think you have taken too much of this medicine contact a poison control center or emergency room at once. NOTE: This medicine is only for you. Do not share this medicine with others. What if I miss a dose? If you miss a dose, take it as soon as you can. If it is almost time for your next dose, take only that dose. Do not take double or extra doses. What may interact with this medicine? -cimetidine -probenecid This list may not describe all possible interactions. Give your health care provider a list of all the medicines, herbs, non-prescription drugs, or dietary supplements you use. Also tell them if  you smoke, drink alcohol, or use illegal drugs. Some items may interact with your medicine. What should I watch for while using this medicine? Tell your doctor or health care professional if your symptoms do not start to get better after 1 week. This medicine works best when taken early in the course of an infection, within the first 87 hours. Begin treatment as soon as possible after the first signs of infection like tingling, itching, or pain in the affected area. It is possible that genital herpes may still be spread even when you are not having symptoms. Always use safer sex practices like condoms made of latex or polyurethane whenever you have sexual contact. You should stay well hydrated while taking this medicine. Drink  plenty of fluids. What side effects may I notice from receiving this medicine? Side effects that you should report to your doctor or health care professional as soon as possible: -allergic reactions like skin rash, itching or hives, swelling of the face, lips, or tongue -aggressive behavior -confusion -hallucinations -problems with balance, talking, walking -stomach pain -tremor -trouble passing urine or change in the amount of urine Side effects that usually do not require medical attention (report to your doctor or health care professional if they continue or are bothersome): -dizziness -headache -nausea, vomiting This list may not describe all possible side effects. Call your doctor for medical advice about side effects. You may report side effects to FDA at 1-800-FDA-1088. Where should I keep my medicine? Keep out of the reach of children. Store at room temperature between 15 and 25 degrees C (59 and 77 degrees F). Keep container tightly closed. Throw away any unused medicine after the expiration date. NOTE: This sheet is a summary. It may not cover all possible information. If you have questions about this medicine, talk to your doctor, pharmacist, or health care provider.  2015, [employer]. (2012-05-19 16:34:05)

## 2014-07-04 NOTE — Progress Notes (Signed)
Subjective:    Patient ID: Melody Hansen, female    DOB: 09/30/1955, 59 y.o.   MRN: [id number]  HPI  Ms. Denman is a 59 yo female with a CC of tenderness left arm into shoulder and back, Started Saturday in pad of thumb on left hand as tenderness. Then progressed arm. x3 days Does not bother pt at night time.  Clothes bother her when put on top, today not as bad.   Working out twice a week with a Physiological scientist- November began. 1 min on ellipitical and hard to take deep breath.  Nasal spray- Flonsase    Review of Systems  Constitutional: Positive for fatigue. Negative for fever, chills and diaphoresis.  HENT: Negative for congestion, sinus pressure, sneezing and sore throat.   Gastrointestinal: Negative for nausea, vomiting and diarrhea.  Skin: Negative for rash.   Past Medical History  Diagnosis Date  . Hyperlipidemia     familial  . Osteoporosis   . Hx of supraventricular tachycardia   . PSVT (paroxysmal supraventricular tachycardia)     History   Social History  . Marital Status: Married    Spouse Name: N/A    Number of Children: N/A  . Years of Education: N/A   Occupational History  . Not on file.   Social History Main Topics  . Smoking status: Never Smoker   . Smokeless tobacco: Never Used  . Alcohol Use: Yes     Comment: occassional  . Drug Use: No  . Sexual Activity: Not on file   Other Topics Concern  . Not on file   Social History Narrative    Past Surgical History  Procedure Laterality Date  . Tonsillectomy    . Appendectomy    . Ovarian cyst removal    . Removal calcification right sternoclavicular joint    . Breast enhancement surgery      Family History  Problem Relation Age of Onset  . Stroke Father   . Heart disease Father   . Hyperlipidemia Father   . Heart attack Mother   . Hyperlipidemia      family history    Allergies  Allergen Reactions  . Penicillins Hives    Current Outpatient Prescriptions on File Prior to  Visit  Medication Sig Dispense Refill  . Pseudoephedrine-Guaifenesin (MUCINEX D PO) Take 1 tablet by mouth as needed.     . metoprolol tartrate (LOPRESSOR) 25 MG tablet Take 25 mg by mouth as needed.     . Multiple Vitamin (MULTIVITAMIN) capsule Take 1 capsule by mouth daily.    . NON FORMULARY Herbal Life Shakes - Drinks one shake a day     No current facility-administered medications on file prior to visit.       Objective:   Physical Exam  Constitutional: She is oriented to person, place, and time. She appears well-developed and well-nourished. No distress.  BP 108/60 mmHg  Pulse 85  Temp(Src) 97.1 F (36.2 C) (Oral)  Resp 12  Ht 5\' 4"  (1.626 m)  Wt 134 lb 6.4 oz (60.963 kg)  BMI 23.06 kg/m2  SpO2 99%   HENT:  Head: Normocephalic and atraumatic.  Right Ear: External ear normal.  Left Ear: External ear normal.  Cardiovascular: Normal rate, regular rhythm, normal heart sounds and intact distal pulses.  Exam reveals no gallop and no friction rub.   No murmur heard. Pulmonary/Chest: Effort normal and breath sounds normal. No respiratory distress. She has no wheezes. She has no rales. She exhibits no  tenderness.  Neurological: She is alert and oriented to person, place, and time. No cranial nerve deficit. She exhibits normal muscle tone. Coordination normal.  Skin: Skin is warm and dry. No rash noted. She is not diaphoretic.  Psychiatric: She has a normal mood and affect. Her behavior is normal. Judgment and thought content normal.      Assessment & Plan:

## 2014-07-05 DIAGNOSIS — B029 Zoster without complications: Secondary | ICD-10-CM | POA: Insufficient documentation

## 2014-07-05 DIAGNOSIS — R0689 Other abnormalities of breathing: Secondary | ICD-10-CM | POA: Insufficient documentation

## 2014-07-05 HISTORY — DX: Zoster without complications: B02.9

## 2014-07-05 NOTE — Assessment & Plan Note (Signed)
Possible shingles. Pt not showing signs of rash at this point. May still be too early. Will try Valtrex 1000 mg three times daily x 7 days. There are no contraindications found and if she does break out into the shingles rash it will shorten the course. FU prn worsening/failure to improve.

## 2014-07-05 NOTE — Assessment & Plan Note (Signed)
Worsening. Referral for PFT's to be done. Pt states she feels like she is unable to take a deep breath. FU prn worsening/failure to improve.

## 2014-07-07 ENCOUNTER — Ambulatory Visit: Payer: Self-pay | Admitting: Nurse Practitioner

## 2014-07-11 LAB — PULMONARY FUNCTION TEST

## 2014-07-13 ENCOUNTER — Telehealth: Payer: Self-pay | Admitting: Internal Medicine

## 2014-07-13 DIAGNOSIS — R0689 Other abnormalities of breathing: Secondary | ICD-10-CM

## 2014-07-13 NOTE — Telephone Encounter (Signed)
PFTS were normal.  If still having dyspnea, woule recommend referral to our pulmonologist

## 2014-07-13 NOTE — Telephone Encounter (Signed)
Called and advised patient of results,  verbalized understanding. Will think about referral and call us back if agreeable.

## 2014-07-13 NOTE — Assessment & Plan Note (Signed)
Dyspnea with exertion noted by patient.  Referral to Pulmonology offered.

## 2014-07-18 ENCOUNTER — Encounter: Payer: Self-pay | Admitting: Internal Medicine

## 2014-08-08 ENCOUNTER — Other Ambulatory Visit (INDEPENDENT_AMBULATORY_CARE_PROVIDER_SITE_OTHER): Payer: BLUE CROSS/BLUE SHIELD

## 2014-08-08 DIAGNOSIS — Z0000 Encounter for general adult medical examination without abnormal findings: Secondary | ICD-10-CM

## 2014-08-08 LAB — COMPREHENSIVE METABOLIC PANEL
ALBUMIN: 4.4 g/dL (ref 3.5–5.2)
ALK PHOS: 46 U/L (ref 39–117)
ALT: 12 U/L (ref 0–35)
AST: 18 U/L (ref 0–37)
BILIRUBIN TOTAL: 0.9 mg/dL (ref 0.2–1.2)
BUN: 18 mg/dL (ref 6–23)
CO2: 28 mEq/L (ref 19–32)
CREATININE: 0.59 mg/dL (ref 0.40–1.20)
Calcium: 9.7 mg/dL (ref 8.4–10.5)
Chloride: 105 mEq/L (ref 96–112)
GFR: 110.13 mL/min (ref >60.00)
GLUCOSE: 94 mg/dL (ref 70–99)
POTASSIUM: 4.7 meq/L (ref 3.5–5.1)
Sodium: 141 mEq/L (ref 135–145)
Total Protein: 7 g/dL (ref 6.0–8.3)

## 2014-08-08 LAB — CBC WITH DIFFERENTIAL/PLATELET
BASOS PCT: 0.9 % (ref 0.0–3.0)
Basophils Absolute: 0.1 10*3/uL (ref 0.0–0.1)
EOS PCT: 2.3 % (ref 0.0–5.0)
Eosinophils Absolute: 0.1 10*3/uL (ref 0.0–0.7)
HEMATOCRIT: 38 % (ref 36.0–46.0)
Hemoglobin: 13 g/dL (ref 12.0–15.0)
Lymphocytes Relative: 29.8 % (ref 12.0–46.0)
Lymphs Abs: 1.9 10*3/uL (ref 0.7–4.0)
MCHC: 34.2 g/dL (ref 30.0–36.0)
MCV: 82.3 fl (ref 78.0–100.0)
Monocytes Absolute: 0.4 10*3/uL (ref 0.1–1.0)
Monocytes Relative: 6.8 % (ref 3.0–12.0)
NEUTROS ABS: 3.9 10*3/uL (ref 1.4–7.7)
Neutrophils Relative %: 60.2 % (ref 43.0–77.0)
PLATELETS: 239 10*3/uL (ref 150.0–400.0)
RBC: 4.61 Mil/uL (ref 3.87–5.11)
RDW: 15 % (ref 11.5–15.5)
WBC: 6.4 10*3/uL (ref 4.0–10.5)

## 2014-08-08 LAB — LIPID PANEL
CHOL/HDL RATIO: 4
Cholesterol: 254 mg/dL — ABNORMAL HIGH (ref 0–200)
HDL: 69.9 mg/dL (ref >39.00)
LDL Cholesterol: 166 mg/dL — ABNORMAL HIGH (ref 0–99)
NONHDL: 184.1
Triglycerides: 93 mg/dL (ref 0.0–149.0)
VLDL: 18.6 mg/dL (ref 0.0–40.0)

## 2014-08-08 LAB — VITAMIN D 25 HYDROXY (VIT D DEFICIENCY, FRACTURES): VITD: 26.22 ng/mL — AB (ref 30.00–100.00)

## 2014-08-08 LAB — TSH: TSH: 1.04 u[IU]/mL (ref 0.35–4.50)

## 2014-08-08 LAB — HEMOGLOBIN A1C: HEMOGLOBIN A1C: 5.7 % (ref 4.6–6.5)

## 2014-08-09 ENCOUNTER — Encounter: Payer: Self-pay | Admitting: *Deleted

## 2014-08-11 ENCOUNTER — Other Ambulatory Visit (HOSPITAL_COMMUNITY)
Admission: RE | Admit: 2014-08-11 | Discharge: 2014-08-11 | Disposition: A | Discharge status: Home / Self Care | Payer: BLUE CROSS/BLUE SHIELD | Source: Ambulatory Visit | Attending: Internal Medicine | Admitting: Internal Medicine

## 2014-08-11 ENCOUNTER — Encounter: Payer: Self-pay | Admitting: Internal Medicine

## 2014-08-11 ENCOUNTER — Encounter (INDEPENDENT_AMBULATORY_CARE_PROVIDER_SITE_OTHER): Payer: Self-pay

## 2014-08-11 ENCOUNTER — Ambulatory Visit (INDEPENDENT_AMBULATORY_CARE_PROVIDER_SITE_OTHER): Payer: BLUE CROSS/BLUE SHIELD | Admitting: Internal Medicine

## 2014-08-11 VITALS — BP 102/62 | HR 75 | Temp 98.6°F | Resp 14 | Ht 64.0 in | Wt 127.0 lb

## 2014-08-11 DIAGNOSIS — R072 Precordial pain: Secondary | ICD-10-CM

## 2014-08-11 DIAGNOSIS — Z01419 Encounter for gynecological examination (general) (routine) without abnormal findings: Secondary | ICD-10-CM | POA: Insufficient documentation

## 2014-08-11 DIAGNOSIS — Z1151 Encounter for screening for human papillomavirus (HPV): Secondary | ICD-10-CM | POA: Diagnosis present

## 2014-08-11 DIAGNOSIS — E785 Hyperlipidemia, unspecified: Secondary | ICD-10-CM

## 2014-08-11 DIAGNOSIS — Z124 Encounter for screening for malignant neoplasm of cervix: Secondary | ICD-10-CM

## 2014-08-11 DIAGNOSIS — Z0000 Encounter for general adult medical examination without abnormal findings: Secondary | ICD-10-CM

## 2014-08-11 DIAGNOSIS — R0689 Other abnormalities of breathing: Secondary | ICD-10-CM

## 2014-08-11 NOTE — Patient Instructions (Addendum)
Body mass index is 21.79 kg/(m^2). 10 year risk of CAD is 4%  (very low)    You can try benadryl OTC,  NeilMed rinses,  Or Atrovent nasal spray for your drippy nose  Referral to [facility] underway  Health Maintenance Adopting a healthy lifestyle and getting preventive care can go a long way to promote health and wellness. Talk with your health care provider about what schedule of regular examinations is right for you. This is a good chance for you to check in with your provider about disease prevention and staying healthy. In between checkups, there are plenty of things you can do on your own. Experts have done a lot of research about which lifestyle changes and preventive measures are most likely to keep you healthy. Ask your health care provider for more information. WEIGHT AND DIET  Eat a healthy diet  Be sure to include plenty of vegetables, fruits, low-fat dairy products, and lean protein.  Do not eat a lot of foods high in solid fats, added sugars, or salt.  Get regular exercise. This is one of the most important things you can do for your health.  Most adults should exercise for at least 150 minutes each week. The exercise should increase your heart rate and make you sweat (moderate-intensity exercise).  Most adults should also do strengthening exercises at least twice a week. This is in addition to the moderate-intensity exercise.  Maintain a healthy weight  Body mass index (BMI) is a measurement that can be used to identify possible weight problems. It estimates body fat based on height and weight. Your health care provider can help determine your BMI and help you achieve or maintain a healthy weight.  For females 86 years of age and older:   A BMI below 18.5 is considered underweight.  A BMI of 18.5 to 24.9 is normal.  A BMI of 25 to 29.9 is considered overweight.  A BMI of 30 and above is considered obese.  Watch levels of cholesterol and  blood lipids  You should start having your blood tested for lipids and cholesterol at 61 years of age, then have this test every 5 years.  You may need to have your cholesterol levels checked more often if:  Your lipid or cholesterol levels are high.  You are older than 61 years of age.  You are at high risk for heart disease.  CANCER SCREENING   Lung Cancer  Lung cancer screening is recommended for adults 34-73 years old who are at high risk for lung cancer because of a history of smoking.  A yearly low-dose CT scan of the lungs is recommended for people who:  Currently smoke.  Have quit within the past 15 years.  Have at least a 30-pack-year history of smoking. A pack year is smoking an average of one pack of cigarettes a day for 1 year.  Yearly screening should continue until it has been 15 years since you quit.  Yearly screening should stop if you develop a health problem that would prevent you from having lung cancer treatment.  Breast Cancer  Practice breast self-awareness. This means understanding how your breasts normally appear and feel.  It also means doing regular breast self-exams. Let your health care provider know about any changes, no matter how small.  If you are in your 20s or 30s, you should have a clinical breast exam (CBE) by a health care provider every 1-3 years as part of a regular health  exam.  If you are 40 or older, have a CBE every year. Also consider having a breast X-ray (mammogram) every year.  If you have a family history of breast cancer, talk to your health care provider about genetic screening.  If you are at high risk for breast cancer, talk to your health care provider about having an MRI and a mammogram every year.  Breast cancer gene (BRCA) assessment is recommended for women who have family members with BRCA-related cancers. BRCA-related cancers include:  Breast.  Ovarian.  Tubal.  Peritoneal cancers.  Results of the  assessment will determine the need for genetic counseling and BRCA1 and BRCA2 testing. Cervical Cancer Routine pelvic examinations to screen for cervical cancer are no longer recommended for nonpregnant women who are considered low risk for cancer of the pelvic organs (ovaries, uterus, and vagina) and who do not have symptoms. A pelvic examination may be necessary if you have symptoms including those associated with pelvic infections. Ask your health care provider if a screening pelvic exam is right for you.   The Pap test is the screening test for cervical cancer for women who are considered at risk.  If you had a hysterectomy for a problem that was not cancer or a condition that could lead to cancer, then you no longer need Pap tests.  If you are older than 65 years, and you have had normal Pap tests for the past 10 years, you no longer need to have Pap tests.  If you have had past treatment for cervical cancer or a condition that could lead to cancer, you need Pap tests and screening for cancer for at least 20 years after your treatment.  If you no longer get a Pap test, assess your risk factors if they change (such as having a new sexual partner). This can affect whether you should start being screened again.  Some women have medical problems that increase their chance of getting cervical cancer. If this is the case for you, your health care provider may recommend more frequent screening and Pap tests.  The human papillomavirus (HPV) test is another test that may be used for cervical cancer screening. The HPV test looks for the virus that can cause cell changes in the cervix. The cells collected during the Pap test can be tested for HPV.  The HPV test can be used to screen women 72 years of age and older. Getting tested for HPV can extend the interval between normal Pap tests from three to five years.  An HPV test also should be used to screen women of any age who have unclear Pap test  results.  After 61 years of age, women should have HPV testing as often as Pap tests.  Colorectal Cancer  This type of cancer can be detected and often prevented.  Routine colorectal cancer screening usually begins at 61 years of age and continues through 61 years of age.  Your health care provider may recommend screening at an earlier age if you have risk factors for colon cancer.  Your health care provider may also recommend using home test kits to check for hidden blood in the stool.  A small camera at the end of a tube can be used to examine your colon directly (sigmoidoscopy or colonoscopy). This is done to check for the earliest forms of colorectal cancer.  Routine screening usually begins at age 9.  Direct examination of the colon should be repeated every 5-10 years through 61 years of  age. However, you may need to be screened more often if early forms of precancerous polyps or small growths are found. Skin Cancer  Check your skin from head to toe regularly.  Tell your health care provider about any new moles or changes in moles, especially if there is a change in a mole's shape or color.  Also tell your health care provider if you have a mole that is larger than the size of a pencil eraser.  Always use sunscreen. Apply sunscreen liberally and repeatedly throughout the day.  Protect yourself by wearing long sleeves, pants, a wide-brimmed hat, and sunglasses whenever you are outside. HEART DISEASE, DIABETES, AND HIGH BLOOD PRESSURE   Have your blood pressure checked at least every 1-2 years. High blood pressure causes heart disease and increases the risk of stroke.  If you are between 98 years and 20 years old, ask your health care provider if you should take aspirin to prevent strokes.  Have regular diabetes screenings. This involves taking a blood sample to check your fasting blood sugar level.  If you are at a normal weight and have a low risk for diabetes, have this  test once every three years after 61 years of age.  If you are overweight and have a high risk for diabetes, consider being tested at a younger age or more often. PREVENTING INFECTION  Hepatitis B  If you have a higher risk for hepatitis B, you should be screened for this virus. You are considered at high risk for hepatitis B if:  You were born in a country where hepatitis B is common. Ask your health care provider which countries are considered high risk.  Your parents were born in a high-risk country, and you have not been immunized against hepatitis B (hepatitis B vaccine).  You have HIV or AIDS.  You use needles to inject street drugs.  You live with someone who has hepatitis B.  You have had sex with someone who has hepatitis B.  You get hemodialysis treatment.  You take certain medicines for conditions, including cancer, organ transplantation, and autoimmune conditions. Hepatitis C  Blood testing is recommended for:  Everyone born from [birth year] through 1965.  Anyone with known risk factors for hepatitis C. Sexually transmitted infections (STIs)  You should be screened for sexually transmitted infections (STIs) including gonorrhea and chlamydia if:  You are sexually active and are younger than 61 years of age.  You are older than 61 years of age and your health care provider tells you that you are at risk for this type of infection.  Your sexual activity has changed since you were last screened and you are at an increased risk for chlamydia or gonorrhea. Ask your health care provider if you are at risk.  If you do not have HIV, but are at risk, it may be recommended that you take a prescription medicine daily to prevent HIV infection. This is called pre-exposure prophylaxis (PrEP). You are considered at risk if:  You are sexually active and do not regularly use condoms or know the HIV status of your partner(s).  You take drugs by injection.  You are sexually active with  a partner who has HIV. Talk with your health care provider about whether you are at high risk of being infected with HIV. If you choose to begin PrEP, you should first be tested for HIV. You should then be tested every 3 months for as long as you are taking PrEP.  PREGNANCY  If you are premenopausal and you may become pregnant, ask your health care provider about preconception counseling.  If you may become pregnant, take 400 to 800 micrograms (mcg) of folic acid every day.  If you want to prevent pregnancy, talk to your health care provider about birth control (contraception). OSTEOPOROSIS AND MENOPAUSE   Osteoporosis is a disease in which the bones lose minerals and strength with aging. This can result in serious bone fractures. Your risk for osteoporosis can be identified using a bone density scan.  If you are 65 years of age or older, or if you are at risk for osteoporosis and fractures, ask your health care provider if you should be screened.  Ask your health care provider whether you should take a calcium or vitamin D supplement to lower your risk for osteoporosis.  Menopause may have certain physical symptoms and risks.  Hormone replacement therapy may reduce some of these symptoms and risks. Talk to your health care provider about whether hormone replacement therapy is right for you.  HOME CARE INSTRUCTIONS   Schedule regular health, dental, and eye exams.  Stay current with your immunizations.   Do not use any tobacco products including cigarettes, chewing tobacco, or electronic cigarettes.  If you are pregnant, do not drink alcohol.  If you are breastfeeding, limit how much and how often you drink alcohol.  Limit alcohol intake to no more than 1 drink per day for nonpregnant women. One drink equals 12 ounces of beer, 5 ounces of wine, or 1 ounces of hard liquor.  Do not use street drugs.  Do not share needles.  Ask your health care provider for help if you need  support or information about quitting drugs.  Tell your health care provider if you often feel depressed.  Tell your health care provider if you have ever been abused or do not feel safe at home. Document Released: 12/17/2010 Document Revised: 10/18/2013 Document Reviewed: 05/05/2013 Exit[employer] Patient Information 2015 [employer]. This information is not intended to replace advice given to you by your health care provider. Make sure you discuss any questions you have with your health care provider.  

## 2014-08-11 NOTE — Progress Notes (Signed)
Patient ID: Melody Hansen, female   DOB: 1955-08-20, 59 y.o.   MRN: [id number]   Subjective:     Melody Hansen is a 59 y.o. female and is here for a comprehensive physical exam. The patient reports no problems.   She was seen by Melody Hansen in January for dyspnea with exertion and difficult taking a deep breath resulted in PFTS looking for restrictive lung disease,  PFTs were normal.    She underwent Balloon sinuplasty 2 months ago by [facility] for recurrent sinusitis secondary to chronic congestion and poor drainage.  She is feeling better.   History   Social History  . Marital Status: Married    Spouse Name: N/A  . Number of Children: N/A  . Years of Education: N/A   Occupational History  . Not on file.   Social History Main Topics  . Smoking status: Never Smoker   . Smokeless tobacco: Never Used  . Alcohol Use: Yes     Comment: occassional  . Drug Use: No  . Sexual Activity: Not on file   Other Topics Concern  . Not on file   Social History Narrative   Health Maintenance  Topic Date Due  . HIV Screening  07/29/1968  . MAMMOGRAM  06/21/2013  . ZOSTAVAX  07/29/2013  . COLONOSCOPY  08/19/2013  . PAP SMEAR  03/25/2014  . INFLUENZA VACCINE  01/16/2015  . TETANUS/TDAP  03/11/2024    The following portions of the patient's history were reviewed and updated as appropriate: allergies, current medications, past family history, past medical history, past social history, past surgical history and problem list.  Review of Systems A comprehensive review of systems was negative.   Objective:   BP 102/62 mmHg  Pulse 75  Temp(Src) 98.6 F (37 C) (Oral)  Resp 14  Ht 5\' 4"  (1.626 m)  Wt 127 lb (57.607 kg)  BMI 21.79 kg/m2  SpO2 98%  General Appearance:    Alert, cooperative, no distress, appears stated age  Head:    Normocephalic, without obvious abnormality, atraumatic  Eyes:    PERRL, conjunctiva/corneas clear, EOM's intact, fundi    benign, both eyes  Ears:     Normal TM's and external ear canals, both ears  Nose:   Nares normal, septum midline, mucosa normal, no drainage    or sinus tenderness  Throat:   Lips, mucosa, and tongue normal; teeth and gums normal  Neck:   Supple, symmetrical, trachea midline, no adenopathy;    thyroid:  no enlargement/tenderness/nodules; no carotid   bruit or JVD  Back:     Symmetric, no curvature, ROM normal, no CVA tenderness  Lungs:     Clear to auscultation bilaterally, respirations unlabored  Chest Wall:    No tenderness or deformity   Heart:    Regular rate and rhythm, S1 and S2 normal, no murmur, rub   or gallop  Breast Exam:    No tenderness, masses, or nipple abnormality  Abdomen:     Soft, non-tender, bowel sounds active all four quadrants,    no masses, no organomegaly  Genitalia:    Pelvic: cervix normal in appearance, external genitalia normal, no adnexal masses or tenderness, no cervical motion tenderness, rectovaginal septum normal, uterus normal size, shape, and consistency and vagina normal without discharge  Extremities:   Extremities normal, atraumatic, no cyanosis or edema  Pulses:   2+ and symmetric all extremities  Skin:   Skin color, texture, turgor normal, no rashes or lesions  Lymph nodes:  Cervical, supraclavicular, and axillary nodes normal  Neurologic:   CNII-XII intact, normal strength, sensation and reflexes    throughout    Assessment and Plan:    Problem List Items Addressed This Visit    Hyperlipidemia    Based on current lipid profile, the risk of clinically significant CAD is 4% over the next 10 years, using the Framingham risk calculator. There is no indication for medication management.    Lab Results  Component Value Date   CHOL 254* 08/08/2014   HDL 69.90 08/08/2014   LDLCALC 166* 08/08/2014   TRIG 93.0 08/08/2014   CHOLHDL 4 08/08/2014          Encounter for preventive health examination    Annual wellness  exam was done .   During the course of the visit the  patient was educated and counseled about appropriate screening and preventive services including :  diabetes screening, lipid analysis with projected  10 year  risk for CAD , nutrition counseling, colorectal cancer screening, and recommended immunizations.  Printed recommendations for health maintenance screenings was given.       Chest pain   Breathing difficulty    PFTs were done in January as part of assessment and were normal.  Pulmonology referral was declined. Her symptoms have resolved.  No further workup at this time       Other Visit Diagnoses    Screening for cervical cancer    -  Primary    Relevant Orders    Cytology - PAP

## 2014-08-13 DIAGNOSIS — Z0000 Encounter for general adult medical examination without abnormal findings: Secondary | ICD-10-CM | POA: Insufficient documentation

## 2014-08-13 DIAGNOSIS — Z0001 Encounter for general adult medical examination with abnormal findings: Secondary | ICD-10-CM | POA: Insufficient documentation

## 2014-08-13 NOTE — Assessment & Plan Note (Signed)
Based on current lipid profile, the risk of clinically significant CAD is 4% over the next 10 years, using the Framingham risk calculator. There is no indication for medication management.    Lab Results  Component Value Date   CHOL 254* 08/08/2014   HDL 69.90 08/08/2014   LDLCALC 166* 08/08/2014   TRIG 93.0 08/08/2014   CHOLHDL 4 08/08/2014

## 2014-08-13 NOTE — Assessment & Plan Note (Signed)
PFTs were done in January as part of assessment and were normal.  Pulmonology referral was declined. Her symptoms have resolved.  No further workup at this time

## 2014-08-13 NOTE — Assessment & Plan Note (Signed)
Annual wellness  exam was done .   During the course of the visit the patient was educated and counseled about appropriate screening and preventive services including :  diabetes screening, lipid analysis with projected  10 year  risk for CAD , nutrition counseling, colorectal cancer screening, and recommended immunizations.  Printed recommendations for health maintenance screenings was given.

## 2014-08-15 ENCOUNTER — Encounter: Payer: Self-pay | Admitting: *Deleted

## 2014-08-15 LAB — CYTOLOGY - PAP

## 2014-10-09 NOTE — Consult Note (Signed)
PATIENT NAME:  Melody Hansen, Melody Hansen MR#:  [id number] DATE OF BIRTH:  July 24, 1955  DATE OF CONSULTATION:  09/30/2011  REFERRING PHYSICIAN:   CONSULTING PHYSICIAN:  Tana Conch. Leslye Peer, MD  CONSULT AND DISCHARGE:   EMERGENCY ROOM PHYSICIAN: Dr. Cinda Quest.  PRIMARY CARE PHYSICIAN:  Dr. Derrel Nip.  CARDIOLOGY: I did speak with Dr. Fletcher Anon, cardiology.   CHIEF COMPLAINT: Rapid heart rate.   HISTORY OF PRESENT ILLNESS: This is a 59 year old female with history of supraventricular tachycardia. She felt a rapid heart rate. She took a Cardizem 60 mg short-acting one time dose to try to break it. It did not break. She felt faint and sick to her stomach, then developed some chest tightness and shortness of breath during the episode. Chest tightness described 2/3 out of 10 in intensity around the entire chest. Once the patient came to the ER, was given adenosine by the ER physician. The patient broke to normal sinus rhythm. Her chest tightness and shortness of breath went away immediately. She does get this supraventricular tachycardia every few years where she ends up coming into the ER. She does get smaller episodes where she takes a Cardizem and it breaks on its own. In the Emergency Room the first troponin was negative. They did another troponin an hour and a half later and the hospitalists were contacted for further evaluation.   PAST MEDICAL HISTORY:  1. Supraventricular tachycardia. 2. Hyperlipidemia.  3. Allergies    PAST SURGICAL HISTORY:  1. Clavicle surgery.  2. Appendix was removed after an ovary cyst ruptured. 3. Tonsillectomy.   ALLERGIES: Penicillin.   MEDICATIONS:  1. Alavert 1 tablet daily.  2. Lipitor 10 mg at bedtime.  3. She does drink a neuro drink, a herbal drink.   SOCIAL HISTORY: No smoking. Does drink a glass of wine per day. 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   FAMILY HISTORY: Father died at 65 of a cerebrovascular accident and a myocardial infarction. Mother died at 62 of a myocardial  infarction.  REVIEW OF SYSTEMS: CONSTITUTIONAL: Positive for weight gain. No fever, chills, or sweats. Positive for fatigue. EYES: She does have blurry vision and does wear glasses. No blurry vision with the glasses. EARS, NOSE, MOUTH, AND THROAT: Positive for runny nose. Positive for sore throat. No difficulty swallowing. CARDIOVASCULAR: Positive for chest pain. Positive for palpitations. RESPIRATORY: Positive for shortness of breath. No coughing. No sputum. No hemoptysis. GASTROINTESTINAL: Positive for nausea. No vomiting. No abdominal pain, no diarrhea, no constipation, no bright red blood per rectum. No melena. GENITOURINARY: No burning on urination. No hematuria. MUSCULOSKELETAL: No joint pain or muscle pain. INTEGUMENT: No rashes or eruptions. NEUROLOGIC: No fainting or blackouts. PSYCHIATRIC: No anxiety or depression. ENDOCRINE: No thyroid problems. HEMATOLOGIC/LYMPHATIC: No anemia. No easy bruising or bleeding.   PHYSICAL EXAMINATION:  VITAL SIGNS: Vital signs on presentation: Temperature 97.8, pulse 166, respirations 20, blood pressure 142/66, pulse oximetry 99%. Current vital signs: Temperature 97.8, pulse 77, respirations 18, blood pressure 95/62, pulse oximetry 97%.   GENERAL: No respiratory distress.   EYES: Conjunctivae and lids normal. Pupils equal, round, and reactive to light. Extraocular muscles intact. No nystagmus.   EARS, NOSE, MOUTH, AND THROAT: Nasal mucosa, no erythema. Throat, no erythema. No exudate seen. Lips and gums: No lesions.   NECK: No JVD. No bruits. No lymphadenopathy. No thyromegaly. No thyroid nodules palpated.   LUNGS: Lungs are clear to auscultation. No use of accessory muscles to breathe. No rhonchi, rales, or wheeze heard.   HEART: S1, S2 normal. No  gallops, rubs, or murmurs heard. Carotid upstroke 2+ bilaterally. No bruits.   EXTREMITIES: Dorsalis pedis pulses 2+ bilaterally. No edema of the lower extremity.   ABDOMEN: Soft, nontender. No  organomegaly/splenomegaly. Normoactive bowel sounds. No masses felt.   LYMPHATIC: No lymph nodes in the neck.   MUSCULOSKELETAL: No clubbing, edema, or cyanosis.   SKIN: No rashes or ulcers seen.   NEUROLOGIC: Cranial nerves II through XII grossly intact. Deep tendon reflexes are 2+ bilateral lower extremities.   PSYCHIATRIC: The patient is oriented to person, place, and time.   LABORATORY, DIAGNOSTIC, AND RADIOLOGICAL DATA: Glucose 88, BUN 13, creatinine 0.5, sodium 144, potassium 4.0, chloride 106, CO2 29, calcium 9.0. Liver function tests normal range. White blood cell count 7.1, hemoglobin and hematocrit 12.2 and 36.6, platelet count 213. INR 0.9, troponin 0.05 and troponin 0.1.   ASSESSMENT AND PLAN:  1. Supraventricular tachycardia that broke with adenosine given by the ER physician. The patient also took an oral Cardizem prior to coming in and given 15 mg IV Cardizem here. Currently, the patient is in normal sinus rhythm. The patient would like to go home since she broke. With the patient's relative hypotension, the patient states that her normal blood pressure is around systolic 889. I am hesitant on giving any medications that can drop the blood pressure at this point. I did call Dr. Fletcher Anon, cardiology, who will follow up the patient at 10:30 a.m. tomorrow on 10/01/2011. 2. Elevated troponin. I think this is borderline, more related to heart rate. The patient's symptoms are relieved once the patient broke from the supraventricular tachycardia. The patient does do dance and exercise without any chest pain or shortness of breath. I doubt the elevated troponin is cardiac. I believe this is secondary to heart rate.  3. Hyperlipidemia. Continue Lipitor. Did not check a lipid profile since the patient was discharged from the ER.  4. Allergies. Continue Alavert.   TIME SPENT ON ER CONSULTATION AND DISCHARGE: 55 minutes.    ____________________________ Tana Conch. Leslye Peer,  MD rjw:ap D: 09/30/2011 21:49:19 ET T: 10/01/2011 10:30:21 ET JOB#: [id number]  cc: Tana Conch. Leslye Peer, MD, <Dictator> Conni Slipper, MD Mertie Clause. Fletcher Anon, MD Deborra Medina, MD  Marisue Brooklyn MD ELECTRONICALLY SIGNED 10/07/2011 21:21

## 2015-04-11 ENCOUNTER — Ambulatory Visit (INDEPENDENT_AMBULATORY_CARE_PROVIDER_SITE_OTHER): Payer: BLUE CROSS/BLUE SHIELD | Admitting: Nurse Practitioner

## 2015-04-11 VITALS — BP 100/68 | HR 86 | Temp 98.3°F | Resp 14 | Ht 64.0 in | Wt 121.2 lb

## 2015-04-11 DIAGNOSIS — R202 Paresthesia of skin: Secondary | ICD-10-CM

## 2015-04-11 MED ORDER — GABAPENTIN 100 MG PO CAPS: 100.0000 mg | ORAL_CAPSULE | Freq: Every day | ORAL | Status: DC

## 2015-04-11 NOTE — Progress Notes (Signed)
Patient ID: Melody Hansen, female    DOB: June 18, 1955  Age: 59 y.o. MRN: [id number]  CC: Shoulder Pain   HPI Melody Hansen presents for CC of  Shoulder pain, headaches, and eye twitching.   1) Patient reports base of skull pain (left side- not right) shooting pains to posterior occiput and occasionally along the jaw line. Feels pinching sensation, very tender to touch intermittently.   Eye twitching x 2 months and headache on left side. Concerned about shingles. Pt reports sinus pressure, neck pain x 1 day, sensitivity of skin x 1 day. Worsening she reports. Disturbs sleep. See ROS for negative symptoms.   History Melody Hansen has a past medical history of Hyperlipidemia; Osteoporosis; supraventricular tachycardia; and PSVT (paroxysmal supraventricular tachycardia).   She has past surgical history that includes Tonsillectomy; Appendectomy; Ovarian cyst removal; removal calcification right sternoclavicular joint; Breast enhancement surgery; and Cardiac electrophysiology study and ablation (March 2015).   Her family history includes COPD in her father and mother; Heart attack in her mother; Heart disease (age of onset: 19) in her father; Hyperlipidemia in her father and another family member; Stroke in her father.She reports that she has never smoked. She has never used smokeless tobacco. She reports that she drinks alcohol. She reports that she does not use illicit drugs.  Outpatient Prescriptions Prior to Visit  Medication Sig Dispense Refill  . Multiple Vitamin (MULTIVITAMIN) capsule Take 1 capsule by mouth daily.    . metoprolol tartrate (LOPRESSOR) 25 MG tablet Take 25 mg by mouth as needed.     . NON FORMULARY Herbal Life Shakes - Drinks one shake a day    . Pseudoephedrine-Guaifenesin (MUCINEX D PO) Take 1 tablet by mouth as needed.     . valACYclovir (VALTREX) 1000 MG tablet Take 1 tablet (1,000 mg total) by mouth 3 (three) times daily. (Patient not taking: Reported on 08/11/2014) 21  tablet 0   No facility-administered medications prior to visit.    ROS Review of Systems  Constitutional: Negative for fever, chills, diaphoresis and fatigue.  Eyes: Negative for visual disturbance.       Eye twitching  Respiratory: Negative for chest tightness, shortness of breath and wheezing.   Cardiovascular: Negative for chest pain, palpitations and leg swelling.  Gastrointestinal: Negative for nausea, vomiting and diarrhea.  Musculoskeletal: Positive for myalgias and arthralgias.  Skin: Negative for rash.  Neurological: Positive for headaches. Negative for dizziness, tremors, seizures, syncope, facial asymmetry, speech difficulty, weakness and numbness.  Psychiatric/Behavioral: Positive for sleep disturbance. The patient is nervous/anxious.     Objective:  BP 100/68 mmHg  Pulse 86  Temp(Src) 98.3 F (36.8 C)  Resp 14  Ht 5\' 4"  (1.626 m)  Wt 121 lb 3.2 oz (54.976 kg)  BMI 20.79 kg/m2  SpO2 97%  Physical Exam  Constitutional: She is oriented to person, place, and time. She appears well-developed and well-nourished. No distress.  HENT:  Head: Normocephalic and atraumatic.  Right Ear: External ear normal.  Left Ear: External ear normal.  Mouth/Throat: No oropharyngeal exudate.  TM's clear bilaterally  Eyes: EOM are normal. Pupils are equal, round, and reactive to light. Right eye exhibits no discharge. Left eye exhibits no discharge. No scleral icterus.  Neck: Normal range of motion. Neck supple.  Cardiovascular: Normal rate, regular rhythm and normal heart sounds.  Exam reveals no gallop and no friction rub.   No murmur heard. Pulmonary/Chest: Effort normal and breath sounds normal. No respiratory distress. She has no wheezes. She has no rales. She  exhibits no tenderness.  Musculoskeletal: Normal range of motion. She exhibits no edema or tenderness.  Lymphadenopathy:    She has no cervical adenopathy.  Neurological: She is alert and oriented to person, place, and  time. No cranial nerve deficit. She exhibits normal muscle tone. Coordination normal.  Cranial nerves II through XII intact on exam.   Skin: Skin is warm and dry. No rash noted. She is not diaphoretic.  Psychiatric: She has a normal mood and affect. Her behavior is normal. Judgment and thought content normal.   Assessment & Plan:   There are no diagnoses linked to this encounter. I have discontinued Ms. Wellbrock's Pseudoephedrine-Guaifenesin (MUCINEX D PO), metoprolol tartrate, NON FORMULARY, and valACYclovir. I am also having her start on gabapentin. Additionally, I am having her maintain her multivitamin, loratadine, and lisdexamfetamine.  Meds ordered this encounter  Medications  . loratadine (CLARITIN) 10 MG tablet    Sig: Take 10 mg by mouth as needed for allergies.  Marland Kitchen lisdexamfetamine (VYVANSE) 30 MG capsule    Sig: Take 30 mg by mouth daily.  Marland Kitchen gabapentin (NEURONTIN) 100 MG capsule    Sig: Take 1 capsule (100 mg total) by mouth at bedtime.    Dispense:  90 capsule    Refill:  0    Order Specific Question:  Supervising Provider    Answer:  Crecencio Mc [[id number]]     Follow-up: Return in about 3 weeks (around 05/03/2015) for Medication follow up .

## 2015-04-11 NOTE — Progress Notes (Signed)
Pre visit review using our clinic review tool, if applicable. No additional management support is needed unless otherwise documented below in the visit note. 

## 2015-04-11 NOTE — Patient Instructions (Signed)
Please try the Gabapentin 1 capsule at night. We can try 2 capsules at night after 7 days. Follow up around 05/03/2015.   Call us if anything changes.

## 2015-04-21 ENCOUNTER — Encounter: Payer: Self-pay | Admitting: Nurse Practitioner

## 2015-04-21 DIAGNOSIS — M542 Cervicalgia: Secondary | ICD-10-CM

## 2015-04-21 HISTORY — DX: Cervicalgia: M54.2

## 2015-04-21 NOTE — Assessment & Plan Note (Signed)
Will try 100 mg of neurontin at night time. Unknown etiology and can't r/out trigeminal neuralgia. Asked her to try 2 capsules at night (200 mg) after 7 days. Follow up in 3 weeks with Dr. Derrel Nip. Asked her to RTC or call if any changes to symptoms occur.

## 2015-05-08 ENCOUNTER — Ambulatory Visit (INDEPENDENT_AMBULATORY_CARE_PROVIDER_SITE_OTHER): Payer: BLUE CROSS/BLUE SHIELD | Admitting: Internal Medicine

## 2015-05-08 ENCOUNTER — Encounter: Payer: Self-pay | Admitting: Internal Medicine

## 2015-05-08 VITALS — BP 102/62 | HR 70 | Temp 98.2°F | Resp 12 | Ht 64.0 in | Wt 122.0 lb

## 2015-05-08 DIAGNOSIS — R41840 Attention and concentration deficit: Secondary | ICD-10-CM | POA: Diagnosis not present

## 2015-05-08 DIAGNOSIS — H01003 Unspecified blepharitis right eye, unspecified eyelid: Secondary | ICD-10-CM | POA: Diagnosis not present

## 2015-05-08 DIAGNOSIS — M542 Cervicalgia: Secondary | ICD-10-CM | POA: Diagnosis not present

## 2015-05-08 MED ORDER — BUPROPION HCL ER (XL) 150 MG PO TB24: 150.0000 mg | ORAL_TABLET | Freq: Every day | ORAL | Status: DC

## 2015-05-08 MED ORDER — BUPROPION HCL ER (SR) 100 MG PO TB12: 100.0000 mg | ORAL_TABLET | Freq: Two times a day (BID) | ORAL | Status: DC

## 2015-05-08 NOTE — Assessment & Plan Note (Signed)
Trial of wellbutrin discussed.  If not tolerated, will need referral to psychology for ADD diagnosis before i will prescribed stimulants for treatment

## 2015-05-08 NOTE — Progress Notes (Signed)
Pre-visit discussion using our clinic review tool. No additional management support is needed unless otherwise documented below in the visit note.  

## 2015-05-08 NOTE — Patient Instructions (Signed)
We discussed trying wellbutrin for you concentration  deficits  Either :   XL 1 tablet daily in morning  OR:   SR 1 tablet in am  2nd tablet by 2 pm

## 2015-05-08 NOTE — Assessment & Plan Note (Addendum)
Secondary to DDD cervical spine presumed,  Suggested by history .  Previous episode resolved with NSAIDS.  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

## 2015-05-08 NOTE — Assessment & Plan Note (Signed)
Advised to resume warm compresses and topical antibiotic.

## 2015-05-08 NOTE — Progress Notes (Signed)
Subjective:  Patient ID: Melody Hansen, female    DOB: 06/23/1955  Age: 59 y.o. MRN: [id number]  CC: The primary encounter diagnosis was Blepharitis of eyelid of right eye. Diagnoses of Concentration deficit and Cervicalgia were also pertinent to this visit.  HPI Melody Hansen presents for follow up on left sided neck and head pain radiates to jaw line.  Was treated for trigeminal neuralgia with neurontin 200 mg qhs by [facility]bird 3 weeks ago, but she deferred using it due to risk of side effects.. Her pain resolved after a few days without medications. o She reprts concentration deficits that she has regarded as untreated ADD since adolescence and is requesting treatment.  After her ablation her  cardiologist gave her low dose Vyvanse 30 mg dose last year and she used it for a month before the cost of a refill was prohibitive. She noticed slight improvement but has retired since then.  She would like to resume some form of treatment but has no formal diagnosis.   She was treated for a Corneal ulcer 2 weeks ago involving the right eye with a unknown topical eye drop, while attending a conference in [place] .  Was also treated for blepharitis with  A medication that may have been Biaxin,  Which caused severe reflux,  She has an appointment with her own ophthalmologist next week.    Outpatient Prescriptions Prior to Visit  Medication Sig Dispense Refill  . lisdexamfetamine (VYVANSE) 30 MG capsule Take 30 mg by mouth daily.    Marland Kitchen gabapentin (NEURONTIN) 100 MG capsule Take 1 capsule (100 mg total) by mouth at bedtime. (Patient not taking: Reported on 05/08/2015) 90 capsule 0  . Multiple Vitamin (MULTIVITAMIN) capsule Take 1 capsule by mouth daily.    Marland Kitchen loratadine (CLARITIN) 10 MG tablet Take 10 mg by mouth as needed for allergies.     No facility-administered medications prior to visit.    Review of Systems;  Patient denies headache, fevers, malaise, unintentional weight loss, skin rash, eye  pain, sinus congestion and sinus pain, sore throat, dysphagia,  hemoptysis , cough, dyspnea, wheezing, chest pain, palpitations, orthopnea, edema, abdominal pain, nausea, melena, diarrhea, constipation, flank pain, dysuria, hematuria, urinary  Frequency, nocturia, numbness, tingling, seizures,  Focal weakness, Loss of consciousness,  Tremor, insomnia, depression, anxiety, and suicidal ideation.      Objective:  BP 102/62 mmHg  Pulse 70  Temp(Src) 98.2 F (36.8 C) (Oral)  Resp 12  Ht 5\' 4"  (1.626 m)  Wt 122 lb (55.339 kg)  BMI 20.93 kg/m2  SpO2 98%  BP Readings from Last 3 Encounters:  05/08/15 102/62  04/11/15 100/68  08/11/14 102/62    Wt Readings from Last 3 Encounters:  05/08/15 122 lb (55.339 kg)  04/11/15 121 lb 3.2 oz (54.976 kg)  08/11/14 127 lb (57.607 kg)    General appearance: alert, cooperative and appears stated age Ears: normal TM's and external ear canals both ears Throat: lips, mucosa, and tongue normal; teeth and gums normal Neck: no adenopathy, no carotid bruit, supple, symmetrical, trachea midline and thyroid not enlarged, symmetric, no tenderness/mass/nodules Back: symmetric, no curvature. ROM normal. No CVA tenderness. Lungs: clear to auscultation bilaterally Heart: regular rate and rhythm, S1, S2 normal, no murmur, click, rub or gallop Abdomen: soft, non-tender; bowel sounds normal; no masses,  no organomegaly Pulses: 2+ and symmetric Skin: Skin color, texture, turgor normal. No rashes or lesions Lymph nodes: Cervical, supraclavicular, and axillary nodes normal.  Lab Results  Component Value Date  HGBA1C 5.7 08/08/2014    Lab Results  Component Value Date   CREATININE 0.59 08/08/2014   CREATININE 0.50* 09/30/2011   CREATININE 0.51 08/20/2011    Lab Results  Component Value Date   WBC 6.4 08/08/2014   HGB 13.0 08/08/2014   HCT 38.0 08/08/2014   PLT 239.0 08/08/2014   GLUCOSE 94 08/08/2014   CHOL 254* 08/08/2014   TRIG 93.0 08/08/2014    HDL 69.90 08/08/2014   LDLCALC 166* 08/08/2014   ALT 12 08/08/2014   AST 18 08/08/2014   NA 141 08/08/2014   K 4.7 08/08/2014   CL 105 08/08/2014   CREATININE 0.59 08/08/2014   BUN 18 08/08/2014   CO2 28 08/08/2014   TSH 1.04 08/08/2014   INR 0.9 09/30/2011   HGBA1C 5.7 08/08/2014    No results found.  Assessment & Plan:   Problem List Items Addressed This Visit    Cervicalgia    Secondary to DDD cervical spine presumed,  Suggested by history .  Previous episode resolved with NSAIDS.  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       Concentration deficit    Trial of wellbutrin discussed.  If not tolerated, will need referral to psychology for ADD diagnosis before i will prescribed stimulants for treatment          I have discontinued Ms. Buehl's loratadine. I am also having her maintain her multivitamin, lisdexamfetamine, gabapentin, cetirizine, buPROPion, and buPROPion.  Meds ordered this encounter  Medications  . cetirizine (ZYRTEC) 10 MG tablet    Sig: Take 10 mg by mouth daily.  Marland Kitchen DISCONTD: buPROPion (WELLBUTRIN XL) 150 MG 24 hr tablet    Sig: Take 1 tablet (150 mg total) by mouth daily.    Dispense:  30 tablet    Refill:  1  . DISCONTD: buPROPion (WELLBUTRIN SR) 100 MG 12 hr tablet    Sig: Take 1 tablet (100 mg total) by mouth 2 (two) times daily.    Dispense:  60 tablet    Refill:  1  . buPROPion (WELLBUTRIN SR) 100 MG 12 hr tablet    Sig: Take 1 tablet (100 mg total) by mouth 2 (two) times daily.    Dispense:  60 tablet    Refill:  1  . buPROPion (WELLBUTRIN XL) 150 MG 24 hr tablet    Sig: Take 1 tablet (150 mg total) by mouth daily.    Dispense:  30 tablet    Refill:  1   A total of 40 minutes was spent with patient more than half of which was spent in counseling patient on the above mentioned issues , reviewing and  explaining recent labs and imaging studies done, and coordination of care.   Medications Discontinued During This Encounter  Medication Reason  . loratadine (CLARITIN) 10 MG tablet Change in therapy  . buPROPion (WELLBUTRIN SR) 100 MG 12 hr tablet Reorder  . buPROPion (WELLBUTRIN XL) 150 MG 24 hr tablet Reorder    Follow-up: No Follow-up on file.   Crecencio Mc, MD

## 2015-06-02 ENCOUNTER — Telehealth: Payer: Self-pay | Admitting: *Deleted

## 2015-06-02 NOTE — Telephone Encounter (Signed)
I do not prescribe antibiotics over the phone. Has to be seen tell her about the weekend clinic in [facility]

## 2015-06-02 NOTE — Telephone Encounter (Signed)
Patient asking for a ZPAK for a sinus infection.

## 2015-07-06 ENCOUNTER — Other Ambulatory Visit: Payer: Self-pay | Admitting: Internal Medicine

## 2015-08-28 ENCOUNTER — Other Ambulatory Visit: Payer: Self-pay | Admitting: Internal Medicine

## 2015-09-14 ENCOUNTER — Other Ambulatory Visit: Payer: Self-pay | Admitting: Internal Medicine

## 2015-11-19 ENCOUNTER — Other Ambulatory Visit: Payer: Self-pay | Admitting: Internal Medicine

## 2016-02-21 DIAGNOSIS — R05 Cough: Secondary | ICD-10-CM | POA: Diagnosis not present

## 2016-02-21 DIAGNOSIS — Z23 Encounter for immunization: Secondary | ICD-10-CM | POA: Diagnosis not present

## 2016-02-21 DIAGNOSIS — J0141 Acute recurrent pansinusitis: Secondary | ICD-10-CM | POA: Diagnosis not present

## 2016-02-23 DIAGNOSIS — M7541 Impingement syndrome of right shoulder: Secondary | ICD-10-CM

## 2016-02-23 DIAGNOSIS — M25511 Pain in right shoulder: Secondary | ICD-10-CM | POA: Diagnosis not present

## 2016-02-23 HISTORY — DX: Impingement syndrome of right shoulder: M75.41

## 2016-02-29 DIAGNOSIS — R0982 Postnasal drip: Secondary | ICD-10-CM | POA: Diagnosis not present

## 2016-02-29 DIAGNOSIS — H9319 Tinnitus, unspecified ear: Secondary | ICD-10-CM | POA: Diagnosis not present

## 2016-03-12 DIAGNOSIS — H903 Sensorineural hearing loss, bilateral: Secondary | ICD-10-CM | POA: Diagnosis not present

## 2016-03-18 ENCOUNTER — Other Ambulatory Visit: Payer: Self-pay | Admitting: Internal Medicine

## 2016-04-08 ENCOUNTER — Encounter: Payer: Self-pay | Admitting: Family

## 2016-04-08 ENCOUNTER — Ambulatory Visit (INDEPENDENT_AMBULATORY_CARE_PROVIDER_SITE_OTHER): Payer: BLUE CROSS/BLUE SHIELD | Admitting: Family

## 2016-04-08 ENCOUNTER — Ambulatory Visit (INDEPENDENT_AMBULATORY_CARE_PROVIDER_SITE_OTHER): Payer: BLUE CROSS/BLUE SHIELD

## 2016-04-08 VITALS — BP 104/70 | HR 70 | Temp 98.1°F | Ht 64.0 in | Wt 125.0 lb

## 2016-04-08 DIAGNOSIS — R0781 Pleurodynia: Secondary | ICD-10-CM | POA: Diagnosis not present

## 2016-04-08 DIAGNOSIS — S2231XA Fracture of one rib, right side, initial encounter for closed fracture: Secondary | ICD-10-CM | POA: Diagnosis not present

## 2016-04-08 MED ORDER — TRAMADOL HCL 50 MG PO TABS: 50.0000 mg | ORAL_TABLET | Freq: Three times a day (TID) | ORAL | 0 refills | Status: DC | PRN

## 2016-04-08 NOTE — Progress Notes (Signed)
Subjective:    Patient ID: Melody Hansen, female    DOB: [birth year], 60 y.o.   MRN: [id number]  CC: Melody Hansen is a 60 y.o. female who presents today for an acute visit.    HPI: Patient here for acute visit with cc rib pain after fall on right side when hit corner of boat 3 days ago. No laceration.Pain is right sided. Some relief with aleve, ice, wearing brace. Hurts to take deep breaths. No cough, SOB.  No h/o lung disease.    HISTORY:  Past Medical History:  Diagnosis Date  . Hx of supraventricular tachycardia   . Hyperlipidemia    familial  . Osteoporosis   . PSVT (paroxysmal supraventricular tachycardia) ([place])    Past Surgical History:  Procedure Laterality Date  . APPENDECTOMY    . BREAST ENHANCEMENT SURGERY    . CARDIAC ELECTROPHYSIOLOGY STUDY AND ABLATION  March 2015   Dor[facility]a Ogle, [facility] for [facility]T   . OVARIAN CYST REMOVAL    . removal calcification right sternoclavicular joint    . TONSILLECTOMY     Family History  Problem Relation Age of Onset  . Stroke Father   . Heart disease Father 23  . Hyperlipidemia Father   . COPD Father   . Heart attack Mother   . COPD Mother   . Hyperlipidemia      family history    Allergies: Penicillins Current Outpatient Prescriptions on File Prior to Visit  Medication Sig Dispense Refill  . buPROPion (WELLBUTRIN SR) 100 MG 12 hr tablet Take 1 tablet (100 mg total) by mouth 2 (two) times daily. 60 tablet 1  . buPROPion (WELLBUTRIN XL) 150 MG 24 hr tablet TAKE 1 TABLET BY MOUTH EVERY DAY 30 tablet 2  . buPROPion (WELLBUTRIN XL) 150 MG 24 hr tablet TAKE 1 TABLET BY MOUTH EVERY DAY 30 tablet 1  . buPROPion (WELLBUTRIN XL) 150 MG 24 hr tablet TAKE 1 TABLET BY MOUTH EVERY DAY 30 tablet 3  . buPROPion (WELLBUTRIN XL) 150 MG 24 hr tablet TAKE 1 TABLET BY MOUTH EVERY DAY 30 tablet 0  . Multiple Vitamin (MULTIVITAMIN) capsule Take 1 capsule by mouth daily.     No current facility-administered medications on file prior to visit.      Social History  Substance Use Topics  . Smoking status: Never Smoker  . Smokeless tobacco: Never Used  . Alcohol use Yes     Comment: occassional    Review of Systems  Constitutional: Negative for chills and fever.  Respiratory: Negative for cough, chest tightness, shortness of breath and wheezing.   Cardiovascular: Negative for chest pain and palpitations.  Gastrointestinal: Negative for nausea and vomiting.      Objective:    BP 104/70   Pulse 70   Temp 98.1 F (36.7 C) (Oral)   Ht 5\' 4"  (1.626 m)   Wt 125 lb (56.7 kg)   SpO2 96%   BMI 21.46 kg/m    Physical Exam  Constitutional: She appears well-developed and well-nourished.  Eyes: Conjunctivae are normal.  Cardiovascular: Normal rate, regular rhythm, normal heart sounds and normal pulses.   Pulmonary/Chest: Effort normal and breath sounds normal. She has no wheezes. She has no rhonchi. She has no rales.      Point tenderness as noted on diagram.   Neurological: She is alert.  Skin: Skin is warm and dry.  Psychiatric: She has a normal mood and affect. Her speech is normal and behavior is normal. Thought content normal.  Vitals reviewed.      Assessment & Plan:  1. Rib pain on right side SaO2 96. No cough or respiratory distress. Pending rib series to eval for fracture. Advised aleve and PRN tramadol. Return precautions given.  - DG Ribs Unilateral Right - traMADol (ULTRAM) 50 MG tablet; Take 1 tablet (50 mg total) by mouth every 8 (eight) hours as needed.  Dispense: 30 tablet; Refill: 0     I have discontinued Ms. Puett's lisdexamfetamine, gabapentin, and cetirizine. I am also having her start on traMADol. Additionally, I am having her maintain her multivitamin, buPROPion, buPROPion, buPROPion, buPROPion, and buPROPion.   Meds ordered this encounter  Medications  . traMADol (ULTRAM) 50 MG tablet    Sig: Take 1 tablet (50 mg total) by mouth every 8 (eight) hours as needed.    Dispense:  30  tablet    Refill:  0    Order Specific Question:   Supervising Provider    Answer:   Crecencio Mc [[id number]]    Return precautions given.   Risks, benefits, and alternatives of the medications and treatment plan prescribed today were discussed, and patient expressed understanding.   Education regarding symptom management and diagnosis given to patient on AVS.  Continue to follow with TULLO, Aris Everts, MD for routine health maintenance.   [name] and I agreed with plan.   Mable Paris, FNP

## 2016-04-08 NOTE — Patient Instructions (Signed)
Heat and ice.   Let me know how your pain is.  If there is no improvement in your symptoms, or if there is any worsening of symptoms, or if you have any additional concerns, please return for re-evaluation; or, if we are closed, consider going to the Emergency Room for evaluation if symptoms urgent.

## 2016-04-09 ENCOUNTER — Telehealth: Payer: Self-pay | Admitting: *Deleted

## 2016-04-09 NOTE — Telephone Encounter (Signed)
400-600 mg ibuprofen WITH food every 8 hours.   Please let patient know

## 2016-04-09 NOTE — Telephone Encounter (Signed)
What dosage would you recommend? Please advise.

## 2016-04-09 NOTE — Telephone Encounter (Signed)
Pt has requested the proper amount of ibuprofen she should be taking in between of her other prescribed  Pt contact [phone]

## 2016-04-10 NOTE — Telephone Encounter (Signed)
Patient has been informed.

## 2016-04-16 ENCOUNTER — Emergency Department
Admission: EM | Admit: 2016-04-16 | Discharge: 2016-04-16 | Disposition: A | Discharge status: Home / Self Care | Payer: BLUE CROSS/BLUE SHIELD | Attending: Emergency Medicine | Admitting: Emergency Medicine

## 2016-04-16 ENCOUNTER — Telehealth: Payer: Self-pay | Admitting: Internal Medicine

## 2016-04-16 ENCOUNTER — Emergency Department: Payer: BLUE CROSS/BLUE SHIELD

## 2016-04-16 ENCOUNTER — Encounter: Payer: Self-pay | Admitting: Emergency Medicine

## 2016-04-16 DIAGNOSIS — S2231XA Fracture of one rib, right side, initial encounter for closed fracture: Secondary | ICD-10-CM | POA: Diagnosis not present

## 2016-04-16 DIAGNOSIS — Y939 Activity, unspecified: Secondary | ICD-10-CM | POA: Insufficient documentation

## 2016-04-16 DIAGNOSIS — Y999 Unspecified external cause status: Secondary | ICD-10-CM | POA: Insufficient documentation

## 2016-04-16 DIAGNOSIS — S299XXA Unspecified injury of thorax, initial encounter: Secondary | ICD-10-CM | POA: Diagnosis present

## 2016-04-16 DIAGNOSIS — Z79899 Other long term (current) drug therapy: Secondary | ICD-10-CM | POA: Insufficient documentation

## 2016-04-16 DIAGNOSIS — Y929 Unspecified place or not applicable: Secondary | ICD-10-CM | POA: Insufficient documentation

## 2016-04-16 DIAGNOSIS — W19XXXA Unspecified fall, initial encounter: Secondary | ICD-10-CM | POA: Diagnosis not present

## 2016-04-16 MED ORDER — KETOROLAC TROMETHAMINE 30 MG/ML IJ SOLN
30.0000 mg | Freq: Once | INTRAMUSCULAR | Status: AC
  Administered 2016-04-16: 30 mg via INTRAMUSCULAR
  Filled 2016-04-16: qty 1

## 2016-04-16 MED ORDER — HYDROCODONE-ACETAMINOPHEN 5-325 MG PO TABS: 1.0000 | ORAL_TABLET | ORAL | 0 refills | Status: DC | PRN

## 2016-04-16 MED ORDER — MELOXICAM 15 MG PO TABS: 15.0000 mg | ORAL_TABLET | Freq: Every day | ORAL | 0 refills | Status: DC

## 2016-04-16 NOTE — ED Notes (Signed)
Pt was called times 2 for vital signs   No answer in lobby

## 2016-04-16 NOTE — Telephone Encounter (Signed)
Pt called and stated that she came in last week for a broken rib. Pt said that she is really not feeling right. States that there is swelling under the rib the best way she can describe it is that she feels like a lava lamp. Sent pt call to [facility] T[facility].

## 2016-04-16 NOTE — ED Notes (Signed)
Hx of rib fractures. Here for pain management.

## 2016-04-16 NOTE — Telephone Encounter (Signed)
See [facility] note, thanks 

## 2016-04-16 NOTE — ED Provider Notes (Signed)
[facility] Emergency Department Provider Note  ____________________________________________  Time seen: Approximately 6:01 PM  I have reviewed the triage vital signs and the nursing notes.   HISTORY  Chief Complaint Chest Pain (rib pain)    HPI Melody Hansen is a 60 y.o. female who presents emergency department complaining of right rib pain. Patient was seen by her primary care week ago and diagnosed with a fracture to the ninth right rib. Patient states that the pain has increased and moved posteriorly. Patient is concerned that further damage may have been done. She denies any difficulty breathing, shortness of breath, abdominal pain, nausea or vomiting. Patient did not hit her head or lose consciousness during the original injury. No other complaints at this time. Patient has been taking Advil and tramadol as needed for pain.   Past Medical History:  Diagnosis Date  . Hx of supraventricular tachycardia   . Hyperlipidemia    familial  . Osteoporosis   . PSVT (paroxysmal supraventricular tachycardia) [facility])     Patient Active Problem List   Diagnosis Date Noted  . Blepharitis of eyelid of right eye 05/08/2015  . Concentration deficit 05/08/2015  . Cervicalgia 04/21/2015  . Encounter for preventive health examination 08/13/2014  . Breathing difficulty 07/05/2014  . Shingles 07/05/2014  . Back pain, thoracic 03/02/2012  . Chest pain 10/01/2011  . PSVT (paroxysmal supraventricular tachycardia) ([place])   . Hyperlipidemia     Past Surgical History:  Procedure Laterality Date  . APPENDECTOMY    . BREAST ENHANCEMENT SURGERY    . CARDIAC ELECTROPHYSIOLOGY STUDY AND ABLATION  March 2015   Dorothea Ogle, [facility] for [facility]T   . OVARIAN CYST REMOVAL    . removal calcification right sternoclavicular joint    . TONSILLECTOMY      Prior to Admission medications   Medication Sig Start Date End Date Taking? Authorizing Provider  buPROPion (WELLBUTRIN SR) 100  MG 12 hr tablet Take 1 tablet (100 mg total) by mouth 2 (two) times daily. 05/08/15   Crecencio Mc, MD  buPROPion (WELLBUTRIN XL) 150 MG 24 hr tablet TAKE 1 TABLET BY MOUTH EVERY DAY 08/28/15   Crecencio Mc, MD  buPROPion (WELLBUTRIN XL) 150 MG 24 hr tablet TAKE 1 TABLET BY MOUTH EVERY DAY 09/14/15   Crecencio Mc, MD  buPROPion (WELLBUTRIN XL) 150 MG 24 hr tablet TAKE 1 TABLET BY MOUTH EVERY DAY 11/20/15   Crecencio Mc, MD  buPROPion (WELLBUTRIN XL) 150 MG 24 hr tablet TAKE 1 TABLET BY MOUTH EVERY DAY 03/18/16   Crecencio Mc, MD  HYDROcodone-acetaminophen (NORCO/VICODIN) 5-325 MG tablet Take 1 tablet by mouth every 4 (four) hours as needed for moderate pain. 04/16/16   Charline Bills Kahlee Metivier, PA-C  meloxicam (MOBIC) 15 MG tablet Take 1 tablet (15 mg total) by mouth daily. 04/16/16   Charline Bills Markise Haymer, PA-C  Multiple Vitamin (MULTIVITAMIN) capsule Take 1 capsule by mouth daily.    Historical Provider, MD  traMADol (ULTRAM) 50 MG tablet Take 1 tablet (50 mg total) by mouth every 8 (eight) hours as needed. 04/08/16   Burnard Hawthorne, FNP    Allergies Penicillins  Family History  Problem Relation Age of Onset  . Stroke Father   . Heart disease Father 41  . Hyperlipidemia Father   . COPD Father   . Heart attack Mother   . COPD Mother   . Hyperlipidemia      family history    Social History Social History  Substance Use Topics  . Smoking status: Never Smoker  . Smokeless tobacco: Never Used  . Alcohol use Yes     Comment: occassional     Review of Systems  Constitutional: No fever/chills Cardiovascular: no chest pain. Respiratory: no cough. No SOB. Musculoskeletal: Positive for right rib pain. Skin: Negative for rash, abrasions, lacerations, ecchymosis. Neurological: Negative for headaches, focal weakness or numbness. 10-point ROS otherwise negative.  ____________________________________________   PHYSICAL EXAM:  VITAL SIGNS: ED Triage Vitals [04/16/16 1705]  Enc  Vitals Group     BP      Pulse      Resp      Temp      Temp src      SpO2      Weight      Height      Head Circumference      Peak Flow      Pain Score 7     Pain Loc      Pain Edu?      Excl. in [place]?      Constitutional: Alert and oriented. Well appearing and in no acute distress. Eyes: Conjunctivae are normal. PERRL. EOMI. Head: Atraumatic. Neck: No stridor.  No cervical spine tenderness to palpation.  Cardiovascular: Normal rate, regular rhythm. Normal S1 and S2.  Good peripheral circulation. Respiratory: Normal respiratory effort without tachypnea or retractions. Lungs CTAB. Good air entry to the bases with no decreased or absent breath sounds. Gastrointestinal: Bowel sounds 4 quadrants. Soft and nontender to palpation. No guarding or rigidity. No palpable masses. No distention. No CVA tenderness. Musculoskeletal: Full range of motion to all extremities. No gross deformities appreciated. No visible deformities to ribs. No paradoxical chest wall movement. No flail segment. Patient is tender to palpation over the right lateral posterior rib group. Patient is tender in the eighth through 10th rib region. No palpable abnormality. Good underlying breath sounds. Neurologic:  Normal speech and language. No gross focal neurologic deficits are appreciated.  Skin:  Skin is warm, dry and intact. No rash noted. Psychiatric: Mood and affect are normal. Speech and behavior are normal. Patient exhibits appropriate insight and judgement.   ____________________________________________   LABS (all labs ordered are listed, but only abnormal results are displayed)  Labs Reviewed - No data to display ____________________________________________  EKG   ____________________________________________  RADIOLOGY Diamantina Providence Jefrey Raburn, personally viewed and evaluated these images (plain radiographs) as part of my medical decision making, as well as reviewing the written report by the  radiologist.  Dg Ribs Unilateral W/chest Right  Result Date: 04/16/2016 CLINICAL DATA:  [place] 1 week ago.  Persistent right-sided chest pain. EXAM: RIGHT RIBS AND CHEST - 3+ VIEW COMPARISON:  04/08/2016 FINDINGS: The cardiac silhouette, mediastinal and hilar contours are within normal limits and stable. Mild tortuosity of the thoracic aorta. The lungs are clear. No pleural effusion or pneumothorax. Dedicated views of the right ribs demonstrate a stable ninth lateral rib fracture. No displacement. IMPRESSION: No acute cardiopulmonary findings. Stable right ninth rib fracture. Electronically Signed   By: Marijo Sanes M.D.   On: 04/16/2016 17:38    ____________________________________________    PROCEDURES  Procedure(s) performed:    Procedures    Medications  ketorolac (TORADOL) 30 MG/ML injection 30 mg (30 mg Intramuscular Given 04/16/16 1808)     ____________________________________________   INITIAL IMPRESSION / ASSESSMENT AND PLAN / ED COURSE  Pertinent labs & imaging results that were available during my care of the patient were reviewed  by me and considered in my medical decision making (see chart for details).  Review of the [place] CSRS was performed in accordance of the [place] prior to dispensing any controlled drugs.  Clinical Course    Patient's diagnosis is consistent with Rib fracture. Patient was diagnosed with rib fracture week ago. Repeat x-ray reveals no changes. Exam is reassuring. No indication for underlying significant etiology requiring further imaging or labs. Patient is given Toradol injection and emergency department.. Patient will be discharged home with prescriptions for anti-inflammatory and Vicodin. Patient is to follow up with primary care as needed or otherwise directed. Patient is given ED precautions to return to the ED for any worsening or new symptoms.     ____________________________________________  FINAL CLINICAL IMPRESSION(S) / ED  DIAGNOSES  Final diagnoses:  Closed fracture of one rib of right side, initial encounter      NEW MEDICATIONS STARTED DURING THIS VISIT:  New Prescriptions   HYDROCODONE-ACETAMINOPHEN (NORCO/VICODIN) 5-325 MG TABLET    Take 1 tablet by mouth every 4 (four) hours as needed for moderate pain.   MELOXICAM (MOBIC) 15 MG TABLET    Take 1 tablet (15 mg total) by mouth daily.        This chart was dictated using voice recognition software/Dragon. Despite best efforts to proofread, errors can occur which can change the meaning. Any change was purely unintentional.    Darletta Moll, PA-C 04/16/16 [place], MD 04/16/16 2202

## 2016-04-16 NOTE — Telephone Encounter (Signed)
[facility] Patient Name: Melody Hansen DOB: Oct 28, 1955 Initial Comment caller states she has swelling on right side of abdomen and pain in ribs Nurse Assessment Nurse: Markus Daft, RN, Sherre Poot Date/Time (Eastern Time): 04/16/2016 11:16:19 AM Confirm and document reason for call. If symptomatic, describe symptoms. You must click the next button to save text entered. ---Caller states she has swelling on right side of abdomen and pain in ribs. She has fractured rib and is aware that pain is coming from this, and concerned about swelling and the way it feels when she lays down. - Injured Saturday over a week ago after falling. - Her abdomen is really swollen, and right side especially. Has the patient traveled out of the country within the last 30 days? ---Not Applicable Does the patient have any new or worsening symptoms? ---Yes Will a triage be completed? ---Yes Related visit to physician within the last 2 weeks? ---Yes Does the PT have any chronic conditions? (i.e. diabetes, asthma, etc.) ---No Is this a behavioral health or substance abuse call? ---No Guidelines Guideline Title Affirmed Question Affirmed Notes Chest Injury Sounds like a serious injury to the triager Final Disposition User Go to ED Now Markus Daft, RN, [place] Comments May have something serious causing swelling in abdomen, and ER would be best in treating this. Referrals [facility] - ED Disagree/Comply: Comply

## 2016-04-16 NOTE — Telephone Encounter (Signed)
Spoke with the patient, she is currently at the ED at [facility].  She is waiting to be seen.  She will follow up after visit. Thanks  She was seen last week by NP for a broken rib, right side ninth rib. thanks

## 2016-04-16 NOTE — ED Triage Notes (Signed)
States she fell and was dx'd with fx ribs about 1 week ago.  states pain is worse today and feels like pain is more posterior

## 2016-04-16 NOTE — Telephone Encounter (Signed)
FYI

## 2016-04-29 ENCOUNTER — Other Ambulatory Visit: Payer: Self-pay | Admitting: Internal Medicine

## 2016-04-29 NOTE — Telephone Encounter (Signed)
Pt advised on last refill sent in that she needs an OV. Last OV with Dr. Derrel Nip 05/08/15. Has not yet scheduled OV with Dr. Derrel Nip.

## 2016-04-30 ENCOUNTER — Telehealth: Payer: Self-pay | Admitting: *Deleted

## 2016-04-30 MED ORDER — BUPROPION HCL ER (XL) 150 MG PO TB24: 150.0000 mg | ORAL_TABLET | Freq: Every day | ORAL | 0 refills | Status: DC

## 2016-04-30 NOTE — Telephone Encounter (Signed)
Patient scheduled appointment and medication filled for 30 days only.

## 2016-04-30 NOTE — Telephone Encounter (Signed)
Last OV with PCP was 11/16 please advise to refill?

## 2016-04-30 NOTE — Telephone Encounter (Signed)
Patient requested a medication  refill for wellbutrin Pharmacy  [place]

## 2016-04-30 NOTE — Telephone Encounter (Signed)
Refill denied.  Needs OV scheduled so patient must be called. ,  30 days only if scheduled.

## 2016-05-07 DIAGNOSIS — H5340 Unspecified visual field defects: Secondary | ICD-10-CM | POA: Diagnosis not present

## 2016-05-07 DIAGNOSIS — L578 Other skin changes due to chronic exposure to nonionizing radiation: Secondary | ICD-10-CM | POA: Diagnosis not present

## 2016-05-07 DIAGNOSIS — L908 Other atrophic disorders of skin: Secondary | ICD-10-CM | POA: Diagnosis not present

## 2016-05-22 ENCOUNTER — Ambulatory Visit (INDEPENDENT_AMBULATORY_CARE_PROVIDER_SITE_OTHER): Payer: BLUE CROSS/BLUE SHIELD | Admitting: Internal Medicine

## 2016-05-22 ENCOUNTER — Ambulatory Visit (INDEPENDENT_AMBULATORY_CARE_PROVIDER_SITE_OTHER): Payer: BLUE CROSS/BLUE SHIELD

## 2016-05-22 ENCOUNTER — Encounter: Payer: Self-pay | Admitting: Internal Medicine

## 2016-05-22 VITALS — BP 100/60 | HR 75 | Temp 98.0°F | Resp 12 | Ht 64.0 in | Wt 127.2 lb

## 2016-05-22 DIAGNOSIS — S2231XA Fracture of one rib, right side, initial encounter for closed fracture: Secondary | ICD-10-CM

## 2016-05-22 DIAGNOSIS — S2241XD Multiple fractures of ribs, right side, subsequent encounter for fracture with routine healing: Secondary | ICD-10-CM

## 2016-05-22 DIAGNOSIS — R5383 Other fatigue: Secondary | ICD-10-CM | POA: Diagnosis not present

## 2016-05-22 DIAGNOSIS — R7303 Prediabetes: Secondary | ICD-10-CM | POA: Diagnosis not present

## 2016-05-22 DIAGNOSIS — E7800 Pure hypercholesterolemia, unspecified: Secondary | ICD-10-CM | POA: Diagnosis not present

## 2016-05-22 DIAGNOSIS — Z1239 Encounter for other screening for malignant neoplasm of breast: Secondary | ICD-10-CM

## 2016-05-22 DIAGNOSIS — D492 Neoplasm of unspecified behavior of bone, soft tissue, and skin: Secondary | ICD-10-CM | POA: Diagnosis not present

## 2016-05-22 DIAGNOSIS — R41840 Attention and concentration deficit: Secondary | ICD-10-CM | POA: Diagnosis not present

## 2016-05-22 DIAGNOSIS — Z1231 Encounter for screening mammogram for malignant neoplasm of breast: Secondary | ICD-10-CM | POA: Diagnosis not present

## 2016-05-22 DIAGNOSIS — E559 Vitamin D deficiency, unspecified: Secondary | ICD-10-CM

## 2016-05-22 DIAGNOSIS — Z0000 Encounter for general adult medical examination without abnormal findings: Secondary | ICD-10-CM | POA: Diagnosis not present

## 2016-05-22 LAB — LIPID PANEL
CHOLESTEROL: 252 mg/dL — AB (ref 0–200)
HDL: 81.1 mg/dL (ref >39.00)
LDL Cholesterol: 148 mg/dL — ABNORMAL HIGH (ref 0–99)
NONHDL: 170.54
TRIGLYCERIDES: 112 mg/dL (ref 0.0–149.0)
Total CHOL/HDL Ratio: 3
VLDL: 22.4 mg/dL (ref 0.0–40.0)

## 2016-05-22 LAB — COMPREHENSIVE METABOLIC PANEL
ALK PHOS: 50 U/L (ref 39–117)
ALT: 12 U/L (ref 0–35)
AST: 15 U/L (ref 0–37)
Albumin: 4.5 g/dL (ref 3.5–5.2)
BILIRUBIN TOTAL: 0.5 mg/dL (ref 0.2–1.2)
BUN: 16 mg/dL (ref 6–23)
CO2: 31 meq/L (ref 19–32)
CREATININE: 0.61 mg/dL (ref 0.40–1.20)
Calcium: 9.6 mg/dL (ref 8.4–10.5)
Chloride: 102 mEq/L (ref 96–112)
GFR: 105.35 mL/min (ref >60.00)
Glucose, Bld: 102 mg/dL — ABNORMAL HIGH (ref 70–99)
Potassium: 4.2 mEq/L (ref 3.5–5.1)
Sodium: 139 mEq/L (ref 135–145)
TOTAL PROTEIN: 6.5 g/dL (ref 6.0–8.3)

## 2016-05-22 LAB — LDL CHOLESTEROL, DIRECT: Direct LDL: 139 mg/dL

## 2016-05-22 LAB — TSH: TSH: 0.52 u[IU]/mL (ref 0.35–4.50)

## 2016-05-22 LAB — CBC WITH DIFFERENTIAL/PLATELET
BASOS ABS: 0.1 10*3/uL (ref 0.0–0.1)
Basophils Relative: 1.1 % (ref 0.0–3.0)
EOS PCT: 1.6 % (ref 0.0–5.0)
Eosinophils Absolute: 0.1 10*3/uL (ref 0.0–0.7)
HCT: 36.1 % (ref 36.0–46.0)
Hemoglobin: 12.1 g/dL (ref 12.0–15.0)
LYMPHS ABS: 1.1 10*3/uL (ref 0.7–4.0)
Lymphocytes Relative: 20.7 % (ref 12.0–46.0)
MCHC: 33.6 g/dL (ref 30.0–36.0)
MCV: 84.6 fl (ref 78.0–100.0)
MONO ABS: 0.4 10*3/uL (ref 0.1–1.0)
MONOS PCT: 7.4 % (ref 3.0–12.0)
NEUTROS ABS: 3.8 10*3/uL (ref 1.4–7.7)
NEUTROS PCT: 69.2 % (ref 43.0–77.0)
PLATELETS: 244 10*3/uL (ref 150.0–400.0)
RBC: 4.27 Mil/uL (ref 3.87–5.11)
RDW: 13.9 % (ref 11.5–15.5)
WBC: 5.5 10*3/uL (ref 4.0–10.5)

## 2016-05-22 LAB — HEMOGLOBIN A1C: Hgb A1c MFr Bld: 5.5 % (ref 4.6–6.5)

## 2016-05-22 LAB — VITAMIN D 25 HYDROXY (VIT D DEFICIENCY, FRACTURES): VITD: 21.67 ng/mL — ABNORMAL LOW (ref 30.00–100.00)

## 2016-05-22 MED ORDER — BUPROPION HCL ER (XL) 300 MG PO TB24: 300.0000 mg | ORAL_TABLET | Freq: Every day | ORAL | 5 refills | Status: DC

## 2016-05-22 NOTE — Progress Notes (Addendum)
Patient ID: Melody Hansen, female    DOB: 1955/10/23  Age: 60 y.o. MRN: [id number]  The patient is here for annual examination and management of other chronic and acute problems.   The risk factors are reflected in the social history.  The roster of all physicians providing medical care to patient - is listed in the Snapshot section of the chart.  Home safety : The patient has smoke detectors in the home. They wear seatbelts.  There are no firearms at home. There is no violence in the home.   There is no risks for hepatitis, STDs or HIV. There is no   history of blood transfusion. They have no travel history to infectious disease endemic areas of the world.  The patient has seen their dentist in the last six month. They have seen their eye doctor in the last year. T  They do not  have excessive sun exposure. Discussed the need for sun protection: hats, long sleeves and use of sunscreen if there is significant sun exposure.   Diet: the importance of a healthy diet is discussed. They do have a healthy diet.  The benefits of regular aerobic exercise were discussed. She walks 4 times per week ,  20 minutes.   Depression screen: there are no signs or vegative symptoms of depression- irritability, change in appetite, anhedonia, sadness/tearfullness.  The following portions of the patient's history were reviewed and updated as appropriate: allergies, current medications, past family history, past medical history,  past surgical history, past social history  and problem list.  Visual acuity was not assessed per patient preference since she has regular follow up with her ophthalmologist. Hearing and body mass index were assessed and reviewed.   During the course of the visit the patient was educated and counseled about appropriate screening and preventive services including : fall prevention , diabetes screening, nutrition counseling, colorectal cancer screening, and recommended immunizations.     CC: The primary encounter diagnosis was Encounter for preventive health examination. Diagnoses of Skin neoplasm, Breast cancer screening, Pure hypercholesterolemia, Prediabetes, Closed fracture of one rib of right side, initial encounter, Fatigue, unspecified type, Vitamin D deficiency, and Concentration deficit were also pertinent to this visit.   Right sided rib pain secondary to recent fracture which occurred as a result of a fall while boarding her boat.  Has not exercises in over 2 months due to pain with movement including raising her arm/    History Matie has a past medical history of supraventricular tachycardia; Hyperlipidemia; Osteoporosis; and PSVT (paroxysmal supraventricular tachycardia) ([place]).   She has a past surgical history that includes Tonsillectomy; Appendectomy; Ovarian cyst removal; removal calcification right sternoclavicular joint; Breast enhancement surgery; and Cardiac electrophysiology study and ablation (March 2015).   Her family history includes COPD in her father and mother; Heart attack in her mother; Heart disease (age of onset: 16) in her father; Hyperlipidemia in her father; Stroke in her father.She reports that she has never smoked. She has never used smokeless tobacco. She reports that she drinks alcohol. She reports that she does not use drugs.  Outpatient Medications Prior to Visit  Medication Sig Dispense Refill  . Multiple Vitamin (MULTIVITAMIN) capsule Take 1 capsule by mouth daily.    . traMADol (ULTRAM) 50 MG tablet Take 1 tablet (50 mg total) by mouth every 8 (eight) hours as needed. 30 tablet 0  . buPROPion (WELLBUTRIN XL) 150 MG 24 hr tablet Take 1 tablet (150 mg total) by mouth daily. 30 tablet 0  .  HYDROcodone-acetaminophen (NORCO/VICODIN) 5-325 MG tablet Take 1 tablet by mouth every 4 (four) hours as needed for moderate pain. (Patient not taking: Reported on 05/22/2016) 20 tablet 0  . meloxicam (MOBIC) 15 MG tablet Take 1 tablet (15 mg total)  by mouth daily. (Patient not taking: Reported on 05/22/2016) 30 tablet 0   No facility-administered medications prior to visit.     Review of Systems   Patient denies headache, fevers, malaise, unintentional weight loss, skin rash, eye pain, sinus congestion and sinus pain, sore throat, dysphagia,  hemoptysis , cough, dyspnea, wheezing,  palpitations, orthopnea, edema, abdominal pain, nausea, melena, diarrhea, constipation, flank pain, dysuria, hematuria, urinary  Frequency, nocturia, numbness, tingling, seizures,  Focal weakness, Loss of consciousness,  Tremor, insomnia, depression, anxiety, and suicidal ideation.      Objective:  BP 100/60   Pulse 75   Temp 98 F (36.7 C) (Oral)   Resp 12   Ht 5\' 4"  (1.626 m)   Wt 127 lb 4 oz (57.7 kg)   SpO2 97%   BMI 21.84 kg/m   Physical Exam   General appearance: alert, cooperative and appears stated age Head: Normocephalic, without obvious abnormality, atraumatic Eyes: conjunctivae/corneas clear. PERRL, EOM's intact. Fundi benign. Ears: normal TM's and external ear canals both ears Nose: Nares normal. Septum midline. Mucosa normal. No drainage or sinus tenderness. Throat: lips, mucosa, and tongue normal; teeth and gums normal Neck: no adenopathy, no carotid bruit, no JVD, supple, symmetrical, trachea midline and thyroid not enlarged, symmetric, no tenderness/mass/nodules Lungs: clear to auscultation bilaterally Chest wall:  No bruising .  No tenderness over ribs anteriorly or posteriorly Breasts: implants.  normal appearance, no masses or tenderness Heart: regular rate and rhythm, S1, S2 normal, no murmur, click, rub or gallop Abdomen: soft, non-tender; bowel sounds normal; no masses,  no organomegaly Extremities: extremities normal, atraumatic, no cyanosis or edema Pulses: 2+ and symmetric Skin: Skin color, texture, turgor normal. No rashes or lesions Neurologic: Alert and oriented X 3, normal strength and tone. Normal symmetric  reflexes. Normal coordination and gait.      Assessment & Plan:   Problem List Items Addressed This Visit    Closed fracture of one rib of right side    Per patient,  Rib #9 was fractured when she fell into the stairwell of her boat several weeks ago,.  Given her persistent pain,  Will repeat x ray to monitor for nonunion. Healing frac tures of ribs 9 and 10 were noted ,  Minimally displaced       Relevant Orders   DG Ribs Unilateral Right (Completed)   Concentration deficit    Trial of wellbutrin tolerated . Dose increased today      Encounter for preventive health examination - Primary    Annual comprehensive preventive exam was done as well as an evaluation and management of chronic conditions .  During the course of the visit the patient was educated and counseled about appropriate screening and preventive services including :  diabetes screening, lipid analysis with projected  10 year  risk for CAD , nutrition counseling, breast, cervical and colorectal cancer screening, and recommended immunizations.  Printed recommendations for health maintenance screenings was given.  Her last mammogram was Dec 2012. Follow up annual mammograms have not been done for unclear reasons,  Discussed today with patient. Presumed due to history of shoulder surgery .  Colonoscopy/colon ca screening is also behind and has been addressed.  She is requesting cologuard as an alternative to colonoscopy.  Hyperlipidemia    Based on current lipid profile, the risk of clinically significant CAD is 7.4% over the next 10 years, using the Framingham risk calculator. There is no indication for medication management.    Lab Results  Component Value Date   CHOL 252 (H) 05/22/2016   HDL 81.10 05/22/2016   LDLCALC 148 (H) 05/22/2016   LDLDIRECT 139.0 05/22/2016   TRIG 112.0 05/22/2016   CHOLHDL 3 05/22/2016          Relevant Orders   Lipid panel (Completed)   LDL cholesterol, direct (Completed)   Vitamin D  deficiency    Drisdol prescribed .       Relevant Orders   VITAMIN D 25 Hydroxy (Vit-D Deficiency, Fractures) (Completed)    Other Visit Diagnoses    Skin neoplasm       Relevant Orders   Ambulatory referral to Dermatology   Breast cancer screening       Relevant Orders   MM SCREENING BREAST W/IMPLANT TOMO BILATERAL   Prediabetes       Relevant Orders   Hemoglobin A1c (Completed)   Fatigue, unspecified type       Relevant Orders   Comprehensive metabolic panel (Completed)   TSH (Completed)   CBC with Differential/Platelet (Completed)      I have discontinued Ms. Yoakum's meloxicam and HYDROcodone-acetaminophen. I have also changed her buPROPion. Additionally, I am having her maintain her multivitamin and traMADol.  Meds ordered this encounter  Medications  . buPROPion (WELLBUTRIN XL) 300 MG 24 hr tablet    Sig: Take 1 tablet (300 mg total) by mouth daily.    Dispense:  30 tablet    Refill:  5    Medications Discontinued During This Encounter  Medication Reason  . HYDROcodone-acetaminophen (NORCO/VICODIN) 5-325 MG tablet Change in therapy  . meloxicam (MOBIC) 15 MG tablet Change in therapy  . buPROPion (WELLBUTRIN XL) 150 MG 24 hr tablet Reorder    Follow-up: Return in about 6 months (around 11/20/2016) for MEDICATION REFILL.   Crecencio Mc, MD

## 2016-05-22 NOTE — Progress Notes (Signed)
Pre-visit discussion using our clinic review tool. No additional management support is needed unless otherwise documented below in the visit note.  

## 2016-05-22 NOTE — Patient Instructions (Addendum)
Check with insurance about cologuard coverage   MAMMOGRAM ORDERED  PLESAE CHECK WITH INSURANCE ABOUT COVERAGE OF COLOGUARD    [place] HAS BEEN INCREASED    Health Maintenance for Postmenopausal Women Introduction Menopause is a normal process in which your reproductive ability comes to an end. This process happens gradually over a span of months to years, usually between the ages of 85 and 68. Menopause is complete when you have missed 12 consecutive menstrual periods. It is important to talk with your health care provider about some of the most common conditions that affect postmenopausal women, such as heart disease, cancer, and bone loss (osteoporosis). Adopting a healthy lifestyle and getting preventive care can help to promote your health and wellness. Those actions can also lower your chances of developing some of these common conditions. What should I know about menopause? During menopause, you may experience a number of symptoms, such as:  Moderate-to-severe hot flashes.  Night sweats.  Decrease in sex drive.  Mood swings.  Headaches.  Tiredness.  Irritability.  Memory problems.  Insomnia. Choosing to treat or not to treat menopausal changes is an individual decision that you make with your health care provider. What should I know about hormone replacement therapy and supplements? Hormone therapy products are effective for treating symptoms that are associated with menopause, such as hot flashes and night sweats. Hormone replacement carries certain risks, especially as you become older. If you are thinking about using estrogen or estrogen with progestin treatments, discuss the benefits and risks with your health care provider. What should I know about heart disease and stroke? Heart disease, heart attack, and stroke become more likely as you age. This may be due, in part, to the hormonal changes that your body experiences during menopause. These can affect how  your body processes dietary fats, triglycerides, and cholesterol. Heart attack and stroke are both medical emergencies. There are many things that you can do to help prevent heart disease and stroke:  Have your blood pressure checked at least every 1-2 years. High blood pressure causes heart disease and increases the risk of stroke.  If you are 6-51 years old, ask your health care provider if you should take aspirin to prevent a heart attack or a stroke.  Do not use any tobacco products, including cigarettes, chewing tobacco, or electronic cigarettes. If you need help quitting, ask your health care provider.  It is important to eat a healthy diet and maintain a healthy weight.  Be sure to include plenty of vegetables, fruits, low-fat dairy products, and lean protein.  Avoid eating foods that are high in solid fats, added sugars, or salt (sodium).  Get regular exercise. This is one of the most important things that you can do for your health.  Try to exercise for at least 150 minutes each week. The type of exercise that you do should increase your heart rate and make you sweat. This is known as moderate-intensity exercise.  Try to do strengthening exercises at least twice each week. Do these in addition to the moderate-intensity exercise.  Know your numbers.Ask your health care provider to check your cholesterol and your blood glucose. Continue to have your blood tested as directed by your health care provider. What should I know about cancer screening? There are several types of cancer. Take the following steps to reduce your risk and to catch any cancer development as early as possible. Breast Cancer  Practice breast self-awareness.  This means understanding how your breasts normally  appear and feel.  It also means doing regular breast self-exams. Let your health care provider know about any changes, no matter how small.  If you are 39 or older, have a clinician do a breast exam  (clinical breast exam or CBE) every year. Depending on your age, family history, and medical history, it may be recommended that you also have a yearly breast X-ray (mammogram).  If you have a family history of breast cancer, talk with your health care provider about genetic screening.  If you are at high risk for breast cancer, talk with your health care provider about having an MRI and a mammogram every year.  Breast cancer (BRCA) gene test is recommended for women who have family members with BRCA-related cancers. Results of the assessment will determine the need for genetic counseling and BRCA1 and for BRCA2 testing. BRCA-related cancers include these types:  Breast. This occurs in males or females.  Ovarian.  Tubal. This may also be called fallopian tube cancer.  Cancer of the abdominal or pelvic lining (peritoneal cancer).  Prostate.  Pancreatic. Cervical, Uterine, and Ovarian Cancer  Your health care provider may recommend that you be screened regularly for cancer of the pelvic organs. These include your ovaries, uterus, and vagina. This screening involves a pelvic exam, which includes checking for microscopic changes to the surface of your cervix (Pap test).  For women ages 21-65, health care providers may recommend a pelvic exam and a Pap test every three years. For women ages 14-65, they may recommend the Pap test and pelvic exam, combined with testing for human papilloma virus (HPV), every five years. Some types of HPV increase your risk of cervical cancer. Testing for HPV may also be done on women of any age who have unclear Pap test results.  Other health care providers may not recommend any screening for nonpregnant women who are considered low risk for pelvic cancer and have no symptoms. Ask your health care provider if a screening pelvic exam is right for you.  If you have had past treatment for cervical cancer or a condition that could lead to cancer, you need Pap tests and  screening for cancer for at least 20 years after your treatment. If Pap tests have been discontinued for you, your risk factors (such as having a new sexual partner) need to be reassessed to determine if you should start having screenings again. Some women have medical problems that increase the chance of getting cervical cancer. In these cases, your health care provider may recommend that you have screening and Pap tests more often.  If you have a family history of uterine cancer or ovarian cancer, talk with your health care provider about genetic screening.  If you have vaginal bleeding after reaching menopause, tell your health care provider.  There are currently no reliable tests available to screen for ovarian cancer. Lung Cancer  Lung cancer screening is recommended for adults 26-63 years old who are at high risk for lung cancer because of a history of smoking. A yearly low-dose CT scan of the lungs is recommended if you:  Currently smoke.  Have a history of at least 30 pack-years of smoking and you currently smoke or have quit within the past 15 years. A pack-year is smoking an average of one pack of cigarettes per day for one year. Yearly screening should:  Continue until it has been 15 years since you quit.  Stop if you develop a health problem that would prevent you from  having lung cancer treatment. Colorectal Cancer  This type of cancer can be detected and can often be prevented.  Routine colorectal cancer screening usually begins at age 2 and continues through age 4.  If you have risk factors for colon cancer, your health care provider may recommend that you be screened at an earlier age.  If you have a family history of colorectal cancer, talk with your health care provider about genetic screening.  Your health care provider may also recommend using home test kits to check for hidden blood in your stool.  A small camera at the end of a tube can be used to examine your  colon directly (sigmoidoscopy or colonoscopy). This is done to check for the earliest forms of colorectal cancer.  Direct examination of the colon should be repeated every 5-10 years until age 31. However, if early forms of precancerous polyps or small growths are found or if you have a family history or genetic risk for colorectal cancer, you may need to be screened more often. Skin Cancer  Check your skin from head to toe regularly.  Monitor any moles. Be sure to tell your health care provider:  About any new moles or changes in moles, especially if there is a change in a mole's shape or color.  If you have a mole that is larger than the size of a pencil eraser.  If any of your family members has a history of skin cancer, especially at a young age, talk with your health care provider about genetic screening.  Always use sunscreen. Apply sunscreen liberally and repeatedly throughout the day.  Whenever you are outside, protect yourself by wearing long sleeves, pants, a wide-brimmed hat, and sunglasses. What should I know about osteoporosis? Osteoporosis is a condition in which bone destruction happens more quickly than new bone creation. After menopause, you may be at an increased risk for osteoporosis. To help prevent osteoporosis or the bone fractures that can happen because of osteoporosis, the following is recommended:  If you are 69-36 years old, get at least 1,000 mg of calcium and at least 600 mg of vitamin D per day.  If you are older than age 73 but younger than age 3, get at least 1,200 mg of calcium and at least 600 mg of vitamin D per day.  If you are older than age 26, get at least 1,200 mg of calcium and at least 800 mg of vitamin D per day. Smoking and excessive alcohol intake increase the risk of osteoporosis. Eat foods that are rich in calcium and vitamin D, and do weight-bearing exercises several times each week as directed by your health care provider. What should I  know about how menopause affects my mental health? Depression may occur at any age, but it is more common as you become older. Common symptoms of depression include:  Low or sad mood.  Changes in sleep patterns.  Changes in appetite or eating patterns.  Feeling an overall lack of motivation or enjoyment of activities that you previously enjoyed.  Frequent crying spells. Talk with your health care provider if you think that you are experiencing depression. What should I know about immunizations? It is important that you get and maintain your immunizations. These include:  Tetanus, diphtheria, and pertussis (Tdap) booster vaccine.  Influenza every year before the flu season begins.  Pneumonia vaccine.  Shingles vaccine. Your health care provider may also recommend other immunizations. This information is not intended to replace advice given to  you by your health care provider. Make sure you discuss any questions you have with your health care provider. Document Released: 07/26/2005 Document Revised: 12/22/2015 Document Reviewed: 03/07/2015  2017 Elsevier

## 2016-05-23 DIAGNOSIS — S2241XG Multiple fractures of ribs, right side, subsequent encounter for fracture with delayed healing: Secondary | ICD-10-CM

## 2016-05-23 DIAGNOSIS — E559 Vitamin D deficiency, unspecified: Secondary | ICD-10-CM | POA: Insufficient documentation

## 2016-05-23 HISTORY — DX: Multiple fractures of ribs, right side, subsequent encounter for fracture with delayed healing: S22.41XG

## 2016-05-23 HISTORY — DX: Vitamin D deficiency, unspecified: E55.9

## 2016-05-23 MED ORDER — ERGOCALCIFEROL 1.25 MG (50000 UT) PO CAPS: 50000.0000 [IU] | ORAL_CAPSULE | ORAL | 3 refills | Status: DC

## 2016-05-23 NOTE — Assessment & Plan Note (Addendum)
Trial of wellbutrin tolerated . Dose increased today

## 2016-05-23 NOTE — Assessment & Plan Note (Addendum)
Per patient,  Rib #9 was fractured when she fell into the stairwell of her boat several weeks ago,.  Given her persistent pain,  Will repeat x ray to monitor for nonunion. Healing frac tures of ribs 9 and 10 were noted ,  Minimally displaced

## 2016-05-23 NOTE — Assessment & Plan Note (Addendum)
Annual comprehensive preventive exam was done as well as an evaluation and management of chronic conditions .  During the course of the visit the patient was educated and counseled about appropriate screening and preventive services including :  diabetes screening, lipid analysis with projected  10 year  risk for CAD , nutrition counseling, breast, cervical and colorectal cancer screening, and recommended immunizations.  Printed recommendations for health maintenance screenings was given.  Her last mammogram was Dec 2012. Follow up annual mammograms have not been done for unclear reasons,  Discussed today with patient. Presumed due to history of shoulder surgery .  Colonoscopy/colon ca screening is also behind and has been addressed.  She is requesting cologuard as an alternative to colonoscopy.

## 2016-05-23 NOTE — Addendum Note (Signed)
Addended by: Crecencio Mc on: 05/23/2016 04:15 PM   Modules accepted: Orders

## 2016-05-23 NOTE — Assessment & Plan Note (Addendum)
Based on current lipid profile, the risk of clinically significant CAD is 7.4% over the next 10 years, using the Framingham risk calculator. There is no indication for medication management.    Lab Results  Component Value Date   CHOL 252 (H) 05/22/2016   HDL 81.10 05/22/2016   LDLCALC 148 (H) 05/22/2016   LDLDIRECT 139.0 05/22/2016   TRIG 112.0 05/22/2016   CHOLHDL 3 05/22/2016

## 2016-05-23 NOTE — Assessment & Plan Note (Signed)
Drisdol prescribed.  

## 2016-05-29 ENCOUNTER — Encounter: Payer: Self-pay | Admitting: Internal Medicine

## 2016-05-31 NOTE — Progress Notes (Signed)
cologuard ordered.

## 2016-06-02 ENCOUNTER — Other Ambulatory Visit: Payer: Self-pay | Admitting: Internal Medicine

## 2016-06-04 ENCOUNTER — Ambulatory Visit
Admission: RE | Admit: 2016-06-04 | Discharge: 2016-06-04 | Disposition: A | Discharge status: Home / Self Care | Payer: BLUE CROSS/BLUE SHIELD | Source: Ambulatory Visit | Attending: Internal Medicine | Admitting: Internal Medicine

## 2016-06-04 DIAGNOSIS — Z1239 Encounter for other screening for malignant neoplasm of breast: Secondary | ICD-10-CM

## 2016-06-04 DIAGNOSIS — Z1231 Encounter for screening mammogram for malignant neoplasm of breast: Secondary | ICD-10-CM | POA: Diagnosis not present

## 2016-06-19 ENCOUNTER — Telehealth: Payer: Self-pay

## 2016-06-19 NOTE — Telephone Encounter (Signed)
She will need to make an appointment for further evaluation of pain .  The fractures were minimally displaced so there should be no knot from them

## 2016-06-19 NOTE — Telephone Encounter (Signed)
Patient advised of below .  Appointment scheduled.   

## 2016-06-19 NOTE — Telephone Encounter (Signed)
Patient calls states she is having pain below right rib 9th and 10 th.  She states there is knot that she palpated.  She states she feels pain when she bend or twist to the right .  She is unable to sleep on her right side.  She was last seen on 05/22/16 .  Xray showed healing fracture of 9 and 10 done on 05/22/16 please advise.

## 2016-06-21 ENCOUNTER — Ambulatory Visit (INDEPENDENT_AMBULATORY_CARE_PROVIDER_SITE_OTHER): Payer: BLUE CROSS/BLUE SHIELD | Admitting: Internal Medicine

## 2016-06-21 ENCOUNTER — Ambulatory Visit (INDEPENDENT_AMBULATORY_CARE_PROVIDER_SITE_OTHER): Payer: BLUE CROSS/BLUE SHIELD

## 2016-06-21 VITALS — BP 100/62 | HR 89 | Temp 97.9°F | Resp 16 | Ht 64.0 in | Wt 126.5 lb

## 2016-06-21 DIAGNOSIS — S2241XG Multiple fractures of ribs, right side, subsequent encounter for fracture with delayed healing: Secondary | ICD-10-CM | POA: Diagnosis not present

## 2016-06-21 DIAGNOSIS — M25522 Pain in left elbow: Secondary | ICD-10-CM | POA: Diagnosis not present

## 2016-06-21 DIAGNOSIS — S2241XD Multiple fractures of ribs, right side, subsequent encounter for fracture with routine healing: Secondary | ICD-10-CM | POA: Diagnosis not present

## 2016-06-21 DIAGNOSIS — S99912A Unspecified injury of left ankle, initial encounter: Secondary | ICD-10-CM | POA: Diagnosis not present

## 2016-06-21 DIAGNOSIS — Z1211 Encounter for screening for malignant neoplasm of colon: Secondary | ICD-10-CM | POA: Diagnosis not present

## 2016-06-21 DIAGNOSIS — Z1212 Encounter for screening for malignant neoplasm of rectum: Secondary | ICD-10-CM | POA: Diagnosis not present

## 2016-06-21 DIAGNOSIS — M25572 Pain in left ankle and joints of left foot: Secondary | ICD-10-CM | POA: Diagnosis not present

## 2016-06-21 LAB — COLOGUARD: Cologuard: NEGATIVE

## 2016-06-21 MED ORDER — TRAMADOL HCL 50 MG PO TABS: 50.0000 mg | ORAL_TABLET | Freq: Four times a day (QID) | ORAL | 1 refills | Status: DC | PRN

## 2016-06-21 NOTE — Patient Instructions (Addendum)
Fractures after menopause take much longer to heal , up to 4-6 months.  The swelling you are having may be from hematomas caused by the fractures,  Or from the fractures themselves.  Using NSAIDs may be delaying the healing,  So avoid motrin, and aleve  use tylenol and tramadol (rx attached) for pain control and try applying ice  For 15 mintues every few hours   Make sure you are getting 1800 mg calcium daily and taking the megadose of vitamin D prescribed last month

## 2016-06-21 NOTE — Progress Notes (Signed)
Subjective:  Patient ID: Melody Hansen, female    DOB: 06/14/1956  Age: 61 y.o. MRN: [id number]  CC: The primary encounter diagnosis was Closed fracture of multiple ribs of right side with delayed healing, subsequent encounter. Diagnoses of Pain in left elbow and Closed fracture of multiple ribs of right side with delayed healing were also pertinent to this visit.  HPI Melody Hansen presents for persistent rib pain on the right following a recent fall that resulted in right sided minimally displaced 9th and 10th rib fractures . She was seen on december 6th for her annual , and the fall had occurred 2 months prior and had resulted in persistent pain limiting overhead movement of the right arm .  She returns today with increased concern because of persistent tenderness and  swelling on the right side of her thorax.  She is also now reporting persistent pain involvling the  left medial olecranolan pain since the remote fall and requests x ray of her elbow. There has been no bruising noted of either area.  No pleuritic pain ,   Outpatient Medications Prior to Visit  Medication Sig Dispense Refill  . buPROPion (WELLBUTRIN XL) 300 MG 24 hr tablet Take 1 tablet (300 mg total) by mouth daily. 30 tablet 5  . ergocalciferol (VITAMIN D2) 50000 units capsule Take 1 capsule (50,000 Units total) by mouth once a week. 4 capsule 3  . Multiple Vitamin (MULTIVITAMIN) capsule Take 1 capsule by mouth daily.    Marland Kitchen buPROPion (WELLBUTRIN XL) 150 MG 24 hr tablet TAKE 1 TABLET (150 MG TOTAL) BY MOUTH DAILY. 30 tablet 5  . traMADol (ULTRAM) 50 MG tablet Take 1 tablet (50 mg total) by mouth every 8 (eight) hours as needed. 30 tablet 0   No facility-administered medications prior to visit.     Review of Systems;  Patient denies headache, fevers, malaise, unintentional weight loss, skin rash, eye pain, sinus congestion and sinus pain, sore throat, dysphagia,  hemoptysis , cough, dyspnea, wheezing, chest pain,  palpitations, orthopnea, edema, abdominal pain, nausea, melena, diarrhea, constipation, flank pain, dysuria, hematuria, urinary  Frequency, nocturia, numbness, tingling, seizures,  Focal weakness, Loss of consciousness,  Tremor, insomnia, depression, anxiety, and suicidal ideation.      Objective:  BP 100/62   Pulse 89   Temp 97.9 F (36.6 C) (Oral)   Resp 16   Ht 5\' 4"  (1.626 m)   Wt 126 lb 8 oz (57.4 kg)   SpO2 97%   BMI 21.71 kg/m   BP Readings from Last 3 Encounters:  06/21/16 100/62  05/22/16 100/60  04/16/16 107/61    Wt Readings from Last 3 Encounters:  06/21/16 126 lb 8 oz (57.4 kg)  05/22/16 127 lb 4 oz (57.7 kg)  04/08/16 125 lb (56.7 kg)    General appearance: alert, cooperative and appears stated age Ears: normal TM's and external ear canals both ears Throat: lips, mucosa, and tongue normal; teeth and gums normal Neck: no adenopathy, no carotid bruit, supple, symmetrical, trachea midline and thyroid not enlarged, symmetric, no tenderness/mass/nodules Back: symmetric, no curvature. ROM normal. No CVA tenderness. Lungs: clear to auscultation bilaterally Heart: regular rate and rhythm, S1, S2 normal, no murmur, click, rub or gallop Abdomen: soft, non-tender; bowel sounds normal; no masses,  no organomegaly Pulses: 2+ and symmetric Skin: Skin color, texture, turgor normal. No rashes or lesions Lymph nodes: Cervical, supraclavicular, and axillary nodes normal.  Lab Results  Component Value Date   HGBA1C 5.5 05/22/2016  HGBA1C 5.7 08/08/2014    Lab Results  Component Value Date   CREATININE 0.61 05/22/2016   CREATININE 0.59 08/08/2014   CREATININE 0.50 (L) 09/30/2011    Lab Results  Component Value Date   WBC 5.5 05/22/2016   HGB 12.1 05/22/2016   HCT 36.1 05/22/2016   PLT 244.0 05/22/2016   GLUCOSE 102 (H) 05/22/2016   CHOL 252 (H) 05/22/2016   TRIG 112.0 05/22/2016   HDL 81.10 05/22/2016   LDLDIRECT 139.0 05/22/2016   LDLCALC 148 (H) 05/22/2016    ALT 12 05/22/2016   AST 15 05/22/2016   NA 139 05/22/2016   K 4.2 05/22/2016   CL 102 05/22/2016   CREATININE 0.61 05/22/2016   BUN 16 05/22/2016   CO2 31 05/22/2016   TSH 0.52 05/22/2016   INR 0.9 09/30/2011   HGBA1C 5.5 05/22/2016    Mm Screening Breast W/implant Tomo Bilateral  Result Date: 06/05/2016 CLINICAL DATA:  Screening. EXAM: 2D DIGITAL SCREENING BILATERAL MAMMOGRAM WITH IMPLANTS, CAD AND ADJUNCT TOMO The patient has retropectoral implants. Standard and implant displaced views were performed. COMPARISON:  Previous exam(s). ACR Breast Density Category c: The breast tissue is heterogeneously dense, which may obscure small masses. FINDINGS: There are no findings suspicious for malignancy. Images were processed with CAD. IMPRESSION: No mammographic evidence of malignancy. A result letter of this screening mammogram will be mailed directly to the patient. RECOMMENDATION: Screening mammogram in one year. (Code:SM-B-01Y) BI-RADS CATEGORY  1:  Negative. Electronically Signed   By: Ammie Ferrier M.D.   On: 06/05/2016 13:20    Assessment & Plan:   Problem List Items Addressed This Visit    Closed fracture of multiple ribs of right side with delayed healing    Repeat films show callous formation over previously noted minimally displaced fractures of ribs 9 and 10. Her tenderness on exam suggests that she may have a hematoma in the oblique muscles.       Pain in left elbow    There is no sign of fracture by plain films done today and she has full range of motion. Reassurance provided.       Relevant Orders   DG Elbow Complete Left (Completed)    Other Visit Diagnoses    Closed fracture of multiple ribs of right side with delayed healing, subsequent encounter    -  Primary   Relevant Orders   DG Ribs Unilateral Right (Completed)      I have discontinued Ms. Melody Hansen's traMADol. I am also having her start on traMADol. Additionally, I am having her maintain her  multivitamin, buPROPion, and ergocalciferol.  Meds ordered this encounter  Medications  . traMADol (ULTRAM) 50 MG tablet    Sig: Take 1 tablet (50 mg total) by mouth every 6 (six) hours as needed.    Dispense:  120 tablet    Refill:  1    Medications Discontinued During This Encounter  Medication Reason  . buPROPion (WELLBUTRIN XL) 150 MG 24 hr tablet Change in therapy  . traMADol (ULTRAM) 50 MG tablet Completed Course    Follow-up: No Follow-up on file.   Crecencio Mc, MD

## 2016-06-23 DIAGNOSIS — M25522 Pain in left elbow: Secondary | ICD-10-CM | POA: Insufficient documentation

## 2016-06-23 NOTE — Assessment & Plan Note (Signed)
Repeat films show callous formation over previously noted minimally displaced fractures of ribs 9 and 10. Her tenderness on exam suggests that she may have a hematoma in the oblique muscles.

## 2016-06-23 NOTE — Assessment & Plan Note (Signed)
There is no sign of fracture by plain films done today and she has full range of motion. Reassurance provided.

## 2016-06-24 ENCOUNTER — Telehealth: Payer: Self-pay | Admitting: *Deleted

## 2016-06-24 NOTE — Telephone Encounter (Signed)
Patient notified of results.

## 2016-06-24 NOTE — Telephone Encounter (Signed)
Pt requested lab results  Pt contact [phone]

## 2016-06-28 NOTE — Telephone Encounter (Signed)
Cologuard results negative and abstracted

## 2016-07-09 DIAGNOSIS — D2261 Melanocytic nevi of right upper limb, including shoulder: Secondary | ICD-10-CM | POA: Diagnosis not present

## 2016-07-09 DIAGNOSIS — L728 Other follicular cysts of the skin and subcutaneous tissue: Secondary | ICD-10-CM | POA: Diagnosis not present

## 2016-07-09 DIAGNOSIS — L820 Inflamed seborrheic keratosis: Secondary | ICD-10-CM | POA: Diagnosis not present

## 2016-07-10 ENCOUNTER — Telehealth: Payer: Self-pay | Admitting: Internal Medicine

## 2016-07-10 MED ORDER — BUPROPION HCL ER (XL) 150 MG PO TB24: 150.0000 mg | ORAL_TABLET | Freq: Every day | ORAL | 5 refills | Status: DC

## 2016-07-10 NOTE — Telephone Encounter (Signed)
wellbutrin 150 mg XL sent to [facility]

## 2016-07-10 NOTE — Telephone Encounter (Signed)
Pt called and stated that buPROPion (WELLBUTRIN XL) 300 MG 24 hr tablet is giving her headaches and would like to switch back to 150 mg. She would like the Wellbutrin XL 150 mg refilled. Please advise, thank you!  Pharmacy - [id number] - [place], [place]  Call pt @ [phone]

## 2016-07-17 ENCOUNTER — Telehealth: Payer: Self-pay | Admitting: Internal Medicine

## 2016-07-17 NOTE — Telephone Encounter (Signed)
Vit D2 1.25 mg (50,000) take one capsule by mouth once a week. Qty 90

## 2016-07-18 NOTE — Telephone Encounter (Signed)
I called pharmacy- pt has 1 remaining refill of Vit D 50,000. Per 05/22/16 labs, pt was advised per  Dr. Derrel Nip  to start OTC Vit D3 supplement 1000 units daily.   I advised pharmacy that we will not be filling 50,000 units at this time.

## 2016-09-06 ENCOUNTER — Other Ambulatory Visit: Payer: Self-pay | Admitting: Internal Medicine

## 2016-09-10 ENCOUNTER — Other Ambulatory Visit: Payer: Self-pay

## 2016-09-10 MED ORDER — BUPROPION HCL ER (XL) 150 MG PO TB24: 150.0000 mg | ORAL_TABLET | Freq: Every day | ORAL | 0 refills | Status: DC

## 2016-11-20 ENCOUNTER — Ambulatory Visit (INDEPENDENT_AMBULATORY_CARE_PROVIDER_SITE_OTHER): Payer: BLUE CROSS/BLUE SHIELD | Admitting: Internal Medicine

## 2016-11-20 ENCOUNTER — Telehealth: Payer: Self-pay | Admitting: Internal Medicine

## 2016-11-20 ENCOUNTER — Other Ambulatory Visit: Payer: Self-pay | Admitting: Internal Medicine

## 2016-11-20 ENCOUNTER — Encounter: Payer: Self-pay | Admitting: Internal Medicine

## 2016-11-20 VITALS — BP 98/64 | HR 79 | Temp 98.3°F | Resp 15 | Ht 64.0 in | Wt 119.8 lb

## 2016-11-20 DIAGNOSIS — Z780 Asymptomatic menopausal state: Secondary | ICD-10-CM | POA: Diagnosis not present

## 2016-11-20 DIAGNOSIS — G43909 Migraine, unspecified, not intractable, without status migrainosus: Secondary | ICD-10-CM

## 2016-11-20 DIAGNOSIS — R41840 Attention and concentration deficit: Secondary | ICD-10-CM

## 2016-11-20 DIAGNOSIS — S2241XG Multiple fractures of ribs, right side, subsequent encounter for fracture with delayed healing: Secondary | ICD-10-CM

## 2016-11-20 DIAGNOSIS — E7800 Pure hypercholesterolemia, unspecified: Secondary | ICD-10-CM | POA: Diagnosis not present

## 2016-11-20 DIAGNOSIS — G43C0 Periodic headache syndromes in child or adult, not intractable: Secondary | ICD-10-CM

## 2016-11-20 DIAGNOSIS — R634 Abnormal weight loss: Secondary | ICD-10-CM

## 2016-11-20 HISTORY — DX: Migraine, unspecified, not intractable, without status migrainosus: G43.909

## 2016-11-20 LAB — LIPID PANEL
CHOL/HDL RATIO: 3
CHOLESTEROL: 245 mg/dL — AB (ref 0–200)
HDL: 74.1 mg/dL (ref >39.00)
LDL Cholesterol: 155 mg/dL — ABNORMAL HIGH (ref 0–99)
NonHDL: 170.8
TRIGLYCERIDES: 79 mg/dL (ref 0.0–149.0)
VLDL: 15.8 mg/dL (ref 0.0–40.0)

## 2016-11-20 LAB — COMPREHENSIVE METABOLIC PANEL
ALBUMIN: 4.6 g/dL (ref 3.5–5.2)
ALT: 15 U/L (ref 0–35)
AST: 20 U/L (ref 0–37)
Alkaline Phosphatase: 44 U/L (ref 39–117)
BUN: 17 mg/dL (ref 6–23)
CALCIUM: 10.6 mg/dL — AB (ref 8.4–10.5)
CO2: 32 meq/L (ref 19–32)
CREATININE: 0.63 mg/dL (ref 0.40–1.20)
Chloride: 100 mEq/L (ref 96–112)
GFR: 101.34 mL/min (ref >60.00)
Glucose, Bld: 100 mg/dL — ABNORMAL HIGH (ref 70–99)
Potassium: 4.9 mEq/L (ref 3.5–5.1)
Sodium: 137 mEq/L (ref 135–145)
Total Bilirubin: 0.5 mg/dL (ref 0.2–1.2)
Total Protein: 7.1 g/dL (ref 6.0–8.3)

## 2016-11-20 LAB — TSH: TSH: 0.67 u[IU]/mL (ref 0.35–4.50)

## 2016-11-20 MED ORDER — NARATRIPTAN HCL 2.5 MG PO TABS: 2.5000 mg | ORAL_TABLET | ORAL | 3 refills | Status: DC | PRN

## 2016-11-20 NOTE — Assessment & Plan Note (Signed)
Healing fractures of ribs 9 and 10 were noted ,  Minimally displaced .  DEXA scan ordered

## 2016-11-20 NOTE — Assessment & Plan Note (Signed)
Occurring once or twice per month  Has tried naratriptan with excellent results and no side effects

## 2016-11-20 NOTE — Patient Instructions (Signed)
Please don't lose any more weight!  Start a regular weight bearing exercise program to strenghten your bones   I recommend getting the majority of your calcium and Vitamin D  through diet rather than supplements given the recent association of calcium supplements with increased coronary artery calcium scores  Try the almond and cashew milks that most grocery stores  now carry  in the dairy  Section>   They are lactose free:  [employer] brand Almond Light,  Original formula.  Delicious,  Low carb,  Low cal,  Cholesterol free    A DEXA SCAN HAS BEEN ORDERED

## 2016-11-20 NOTE — Telephone Encounter (Signed)
Pt was asking about a medication for migraines. Please advise, thank you!  Call pt @ [phone]

## 2016-11-20 NOTE — Assessment & Plan Note (Signed)
8 yr  Risk of CAD is 3.7% using FRC.  No treatment needed  Lab Results  Component Value Date   CHOL 245 (H) 11/20/2016   HDL 74.10 11/20/2016   LDLCALC 155 (H) 11/20/2016   LDLDIRECT 139.0 05/22/2016   TRIG 79.0 11/20/2016   CHOLHDL 3 11/20/2016

## 2016-11-20 NOTE — Progress Notes (Signed)
Subjective:  Patient ID: Melody Hansen, female    DOB: 1956/04/17  Age: 61 y.o. MRN: [id number]  CC: The primary encounter diagnosis was Weight loss, unintentional. Diagnoses of Pure hypercholesterolemia, Postmenopausal estrogen deficiency, Closed fracture of multiple ribs of right side with delayed healing, and Concentration deficit were also pertinent to this visit.  HPI [name] presents for follow up on several issues:  1) ADD:  managed with wellbutrin.  Functioning well.  No side effects   2) Rib fractures in January DEXA   Ribs still slightly sore with exertion. fractured her Right foot big toe last week   History for osteopenia vs osteoporosis by cheryl jeffries years ago with [name],  It gave her a headache so she onty took it for one   3) migraine headache:averaging one or two per month relieved with naptripan  2.5 mg borrowed from her daughter in [employer]. rx requested  Left a message om Friday  at 4;46 pm on the Triage Line requesting this medication ,  Never got a call back.     Outpatient Medications Prior to Visit  Medication Sig Dispense Refill  . buPROPion (WELLBUTRIN XL) 150 MG 24 hr tablet Take 1 tablet (150 mg total) by mouth daily. 90 tablet 0  . Multiple Vitamin (MULTIVITAMIN) capsule Take 1 capsule by mouth daily.    . Vitamin D, Ergocalciferol, (DRISDOL) 50000 units CAPS capsule TAKE 1 CAPSULE (50,000 UNITS TOTAL) BY MOUTH ONCE A WEEK. 4 capsule 3  . traMADol (ULTRAM) 50 MG tablet Take 1 tablet (50 mg total) by mouth every 6 (six) hours as needed. (Patient not taking: Reported on 11/20/2016) 120 tablet 1   No facility-administered medications prior to visit.     Review of Systems;  Patient denies , fevers, malaise, unintentional weight loss, skin rash, eye pain, sinus congestion and sinus pain, sore throat, dysphagia,  hemoptysis , cough, dyspnea, wheezing, chest pain, palpitations, orthopnea, edema, abdominal pain, nausea, melena, diarrhea,  constipation, flank pain, dysuria, hematuria, urinary  Frequency, nocturia, numbness, tingling, seizures,  Focal weakness, Loss of consciousness,  Tremor, insomnia, depression, anxiety, and suicidal ideation.      Objective:  BP 98/64 (BP Location: Left Arm, Patient Position: Sitting, Cuff Size: Normal)   Pulse 79   Temp 98.3 F (36.8 C) (Oral)   Resp 15   Ht 5\' 4"  (1.626 m)   Wt 119 lb 12.8 oz (54.3 kg)   SpO2 96%   BMI 20.56 kg/m   BP Readings from Last 3 Encounters:  11/20/16 98/64  06/21/16 100/62  05/22/16 100/60    Wt Readings from Last 3 Encounters:  11/20/16 119 lb 12.8 oz (54.3 kg)  06/21/16 126 lb 8 oz (57.4 kg)  05/22/16 127 lb 4 oz (57.7 kg)    General appearance: alert, cooperative and appears stated age Ears: normal TM's and external ear canals both ears Throat: lips, mucosa, and tongue normal; teeth and gums normal Neck: no adenopathy, no carotid bruit, supple, symmetrical, trachea midline and thyroid not enlarged, symmetric, no tenderness/mass/nodules Back: symmetric, no curvature. ROM normal. No CVA tenderness. Lungs: clear to auscultation bilaterally Heart: regular rate and rhythm, S1, S2 normal, no murmur, click, rub or gallop Abdomen: soft, non-tender; bowel sounds normal; no masses,  no organomegaly Pulses: 2+ and symmetric Skin: Skin color, texture, turgor normal. No rashes or lesions Lymph nodes: Cervical, supraclavicular, and axillary nodes normal.  Lab Results  Component Value Date   HGBA1C 5.5 05/22/2016   HGBA1C 5.7 08/08/2014  Lab Results  Component Value Date   CREATININE 0.63 11/20/2016   CREATININE 0.61 05/22/2016   CREATININE 0.59 08/08/2014    Lab Results  Component Value Date   WBC 5.5 05/22/2016   HGB 12.1 05/22/2016   HCT 36.1 05/22/2016   PLT 244.0 05/22/2016   GLUCOSE 100 (H) 11/20/2016   CHOL 245 (H) 11/20/2016   TRIG 79.0 11/20/2016   HDL 74.10 11/20/2016   LDLDIRECT 139.0 05/22/2016   LDLCALC 155 (H) 11/20/2016    ALT 15 11/20/2016   AST 20 11/20/2016   NA 137 11/20/2016   K 4.9 11/20/2016   CL 100 11/20/2016   CREATININE 0.63 11/20/2016   BUN 17 11/20/2016   CO2 32 11/20/2016   TSH 0.67 11/20/2016   INR 0.9 09/30/2011   HGBA1C 5.5 05/22/2016    Mm Screening Breast W/implant Tomo Bilateral  Result Date: 06/05/2016 CLINICAL DATA:  Screening. EXAM: 2D DIGITAL SCREENING BILATERAL MAMMOGRAM WITH IMPLANTS, CAD AND ADJUNCT TOMO The patient has retropectoral implants. Standard and implant displaced views were performed. COMPARISON:  Previous exam(s). ACR Breast Density Category c: The breast tissue is heterogeneously dense, which may obscure small masses. FINDINGS: There are no findings suspicious for malignancy. Images were processed with CAD. IMPRESSION: No mammographic evidence of malignancy. A result letter of this screening mammogram will be mailed directly to the patient. RECOMMENDATION: Screening mammogram in one year. (Code:SM-B-01Y) BI-RADS CATEGORY  1:  Negative. Electronically Signed   By: Ammie Ferrier M.D.   On: 06/05/2016 13:20    Assessment & Plan:   Problem List Items Addressed This Visit    Hyperlipidemia    10 yr  Risk of CAD is 3.7% using FRC.  No treatment needed  Lab Results  Component Value Date   CHOL 245 (H) 11/20/2016   HDL 74.10 11/20/2016   LDLCALC 155 (H) 11/20/2016   LDLDIRECT 139.0 05/22/2016   TRIG 79.0 11/20/2016   CHOLHDL 3 11/20/2016         Relevant Orders   Lipid panel (Completed)   Concentration deficit     wellbutrin tolerated . No changes today      Closed fracture of multiple ribs of right side with delayed healing     Healing fractures of ribs 9 and 10 were noted ,  Minimally displaced .  DEXA scan ordered       Other Visit Diagnoses    Weight loss, unintentional    -  Primary   Relevant Orders   TSH (Completed)   Comprehensive metabolic panel (Completed)   Postmenopausal estrogen deficiency       Relevant Orders   DG Bone Density        I have discontinued Ms. Proffit's traMADol. I am also having her maintain her multivitamin, Vitamin D (Ergocalciferol), buPROPion, Calcium Carb-Cholecalciferol (CALTRATE 600+D3 SOFT PO), and Pseudoephedrine-Guaifenesin (MUCINEX D PO).  Meds ordered this encounter  Medications  . Calcium Carb-Cholecalciferol (CALTRATE 600+D3 SOFT PO)    Sig: Take 1 capsule by mouth daily.  . Pseudoephedrine-Guaifenesin (MUCINEX D PO)    Sig: Take 1 tablet by mouth.    Medications Discontinued During This Encounter  Medication Reason  . traMADol (ULTRAM) 50 MG tablet Patient has not taken in last 30 days    Follow-up: Return in about 6 months (around 05/22/2017).   Crecencio Mc, MD

## 2016-11-20 NOTE — Assessment & Plan Note (Signed)
wellbutrin tolerated . No changes today

## 2016-11-20 NOTE — Telephone Encounter (Signed)
Spoke with pt and informed her that Dr. Derrel Nip just sent in that rx for her. Pt gave a verbal understanding.

## 2017-01-03 ENCOUNTER — Other Ambulatory Visit: Payer: Self-pay | Admitting: Internal Medicine

## 2017-01-20 ENCOUNTER — Other Ambulatory Visit: Payer: Self-pay | Admitting: Internal Medicine

## 2017-01-20 MED ORDER — BUPROPION HCL ER (XL) 150 MG PO TB24: 150.0000 mg | ORAL_TABLET | Freq: Every day | ORAL | 0 refills | Status: DC

## 2017-01-20 MED ORDER — NARATRIPTAN HCL 2.5 MG PO TABS: 2.5000 mg | ORAL_TABLET | ORAL | 0 refills | Status: DC | PRN

## 2017-02-04 DIAGNOSIS — H524 Presbyopia: Secondary | ICD-10-CM | POA: Diagnosis not present

## 2017-03-17 DIAGNOSIS — H26492 Other secondary cataract, left eye: Secondary | ICD-10-CM | POA: Diagnosis not present

## 2017-03-31 DIAGNOSIS — H26491 Other secondary cataract, right eye: Secondary | ICD-10-CM | POA: Diagnosis not present

## 2017-03-31 DIAGNOSIS — Z961 Presence of intraocular lens: Secondary | ICD-10-CM | POA: Diagnosis not present

## 2017-04-16 DIAGNOSIS — H0012 Chalazion right lower eyelid: Secondary | ICD-10-CM | POA: Diagnosis not present

## 2017-05-28 ENCOUNTER — Ambulatory Visit: Payer: BLUE CROSS/BLUE SHIELD | Admitting: Internal Medicine

## 2017-06-16 ENCOUNTER — Other Ambulatory Visit: Payer: Self-pay | Admitting: Internal Medicine

## 2017-06-16 DIAGNOSIS — Z1231 Encounter for screening mammogram for malignant neoplasm of breast: Secondary | ICD-10-CM

## 2017-06-19 ENCOUNTER — Ambulatory Visit
Admission: RE | Admit: 2017-06-19 | Discharge: 2017-06-19 | Disposition: A | Discharge status: Home / Self Care | Payer: BLUE CROSS/BLUE SHIELD | Source: Ambulatory Visit | Attending: Internal Medicine | Admitting: Internal Medicine

## 2017-06-19 DIAGNOSIS — Z1231 Encounter for screening mammogram for malignant neoplasm of breast: Secondary | ICD-10-CM | POA: Insufficient documentation

## 2017-06-20 ENCOUNTER — Other Ambulatory Visit: Payer: Self-pay | Admitting: Internal Medicine

## 2017-06-20 DIAGNOSIS — N6489 Other specified disorders of breast: Secondary | ICD-10-CM

## 2017-06-20 DIAGNOSIS — R928 Other abnormal and inconclusive findings on diagnostic imaging of breast: Secondary | ICD-10-CM

## 2017-06-24 ENCOUNTER — Ambulatory Visit
Admission: RE | Admit: 2017-06-24 | Discharge: 2017-06-24 | Disposition: A | Discharge status: Home / Self Care | Payer: BLUE CROSS/BLUE SHIELD | Source: Ambulatory Visit | Attending: Internal Medicine | Admitting: Internal Medicine

## 2017-06-24 DIAGNOSIS — R928 Other abnormal and inconclusive findings on diagnostic imaging of breast: Secondary | ICD-10-CM | POA: Diagnosis not present

## 2017-06-24 DIAGNOSIS — R922 Inconclusive mammogram: Secondary | ICD-10-CM | POA: Diagnosis not present

## 2017-06-24 DIAGNOSIS — N6489 Other specified disorders of breast: Secondary | ICD-10-CM

## 2017-07-02 ENCOUNTER — Ambulatory Visit: Payer: BLUE CROSS/BLUE SHIELD

## 2017-07-17 ENCOUNTER — Telehealth: Payer: Self-pay | Admitting: Internal Medicine

## 2017-07-17 ENCOUNTER — Other Ambulatory Visit: Payer: Self-pay

## 2017-07-17 ENCOUNTER — Emergency Department: Payer: BLUE CROSS/BLUE SHIELD

## 2017-07-17 ENCOUNTER — Emergency Department
Admission: EM | Admit: 2017-07-17 | Discharge: 2017-07-17 | Disposition: A | Discharge status: Home / Self Care | Payer: BLUE CROSS/BLUE SHIELD | Attending: Student in an Organized Health Care Education/Training Program | Admitting: Student in an Organized Health Care Education/Training Program

## 2017-07-17 DIAGNOSIS — R202 Paresthesia of skin: Secondary | ICD-10-CM | POA: Diagnosis not present

## 2017-07-17 DIAGNOSIS — R51 Headache: Secondary | ICD-10-CM | POA: Diagnosis not present

## 2017-07-17 DIAGNOSIS — I471 Supraventricular tachycardia: Secondary | ICD-10-CM | POA: Diagnosis not present

## 2017-07-17 DIAGNOSIS — R519 Headache, unspecified: Secondary | ICD-10-CM

## 2017-07-17 DIAGNOSIS — Z79899 Other long term (current) drug therapy: Secondary | ICD-10-CM | POA: Diagnosis not present

## 2017-07-17 NOTE — Discharge Instructions (Signed)

## 2017-07-17 NOTE — Telephone Encounter (Signed)
Received call from ER - Dr Quentin Cornwall.  Melody Hansen presented with left sided headache and trouble finding her words.  Presented to ER.  Call from MD about pt discharge.  She declines to have MRI.  He wanted to make sure she had f/u scheduled and informed needs f/u MRI as outpatient.  I discussed with him my concern regarding possible TIA and need for further w/up.  He agreed and plans to discuss with her more about obtaining MRI.  He does request that if pt refuses further w/up that she have a f/u appt scheduled and will need MRI as outpt.

## 2017-07-17 NOTE — Telephone Encounter (Signed)
See previous message.  Duplicate.  Agree with evaluation now.

## 2017-07-17 NOTE — Telephone Encounter (Signed)
Copied from [place] [id number]. Topic: Quick Communication - See Telephone Encounter >> Jul 17, 2017  1:46 PM Percell Belt A wrote: CRM for notification. See Telephone encounter for: pt called in and said that she is having headaches and cant find her words and maybe a tiny bite of blurred vision .  She would like CMA or Dr Derrel Nip to call her about these side effects.  She does have an appt on 07/24/2017 buPROPion (WELLBUTRIN XL) 150 MG 24 hr tablet [[id number]] - she feels that this med maybe the cause    07/17/17.

## 2017-07-17 NOTE — ED Notes (Signed)
Informed RN that patient has been roomed and is ready for evaluation.  Patient in NAD at this time and call bell placed within reach.   

## 2017-07-17 NOTE — ED Notes (Signed)
First Nurse note:  From [facility] clinic c/o headache x 1 month.  H/o a-fib, has metallic taste in her mouth, and states she has difficulty finding her words at time.  Patient does have h/o migraines.

## 2017-07-17 NOTE — Telephone Encounter (Signed)
Patient is at ER now. 

## 2017-07-17 NOTE — ED Provider Notes (Signed)
[facility] Emergency Department Provider Note    First MD Initiated Contact with Patient 07/17/17 1724     (approximate)  I have reviewed the triage vital signs and the nursing notes.   HISTORY  Chief Complaint Headache    HPI Melody Hansen is a 62 y.o. female a history of SVT as well as migraine headaches presents to the ER for evaluation of left-sided headache abnormal taste in her mouth and reported difficulty finding words at times.  Patient states that she has had symptoms like this starting on Monday.  Is not the worst headache of her life.  States it feels slightly different from previous headaches.  She is concerned this could be related to the Wellbutrin.  Denies any chest pain or palpitations.  No difficulty walking.  No weakness.  No blurry vision.  States that she was having a headache earlier today and called her clinic to see if she could be worked and advised to go to the ER as they could not see her.  Prior to the ER she states that her headache and symptoms are minimal.  She is accompanied by a friend who states that she thinks that she has had slower speech but patient does not noticed this.  Past Medical History:  Diagnosis Date  . Hx of supraventricular tachycardia   . Hyperlipidemia    familial  . Osteoporosis   . PSVT (paroxysmal supraventricular tachycardia) (HCC)    Family History  Problem Relation Age of Onset  . Stroke Father   . Heart disease Father 41  . Hyperlipidemia Father   . COPD Father   . Heart attack Mother   . COPD Mother   . Hyperlipidemia Unknown        family history  . Breast cancer Neg Hx    Past Surgical History:  Procedure Laterality Date  . APPENDECTOMY    . AUGMENTATION MAMMAPLASTY Bilateral    25 years ago  . BREAST ENHANCEMENT SURGERY    . CARDIAC ELECTROPHYSIOLOGY STUDY AND ABLATION  March 2015   Dorothea Ogle, [facility] for SVT   . OVARIAN CYST REMOVAL    . removal calcification right  sternoclavicular joint    . TONSILLECTOMY     Patient Active Problem List   Diagnosis Date Noted  . Migraine headache 11/20/2016  . Pain in left elbow 06/23/2016  . Closed fracture of multiple ribs of right side with delayed healing 05/23/2016  . Vitamin D deficiency 05/23/2016  . Concentration deficit 05/08/2015  . Cervicalgia 04/21/2015  . Encounter for preventive health examination 08/13/2014  . Shingles 07/05/2014  . PSVT (paroxysmal supraventricular tachycardia) ([place])   . Hyperlipidemia       Prior to Admission medications   Medication Sig Start Date End Date Taking? Authorizing Provider  buPROPion (WELLBUTRIN XL) 150 MG 24 hr tablet Take 1 tablet (150 mg total) by mouth daily. 01/20/17  Yes Crecencio Mc, MD  Calcium Carb-Cholecalciferol (CALTRATE 600+D3 SOFT PO) Take 1 capsule by mouth daily.   Yes [provider]  Multiple Vitamin (MULTIVITAMIN) capsule Take 1 capsule by mouth daily.   Yes [provider]  Pseudoephedrine-Guaifenesin (MUCINEX D PO) Take 1 tablet by mouth.   Yes [provider]  naratriptan (AMERGE) 2.5 MG tablet Take 1 tablet (2.5 mg total) by mouth as needed for migraine. Take one (1) tablet at onset of headache; if returns or does not resolve, may repeat after 4 hours; do not exceed five (5) mg  in 24 hours. 01/20/17   Crecencio Mc, MD  Vitamin D, Ergocalciferol, (DRISDOL) 50000 units CAPS capsule TAKE 1 CAPSULE (50,000 UNITS TOTAL) BY MOUTH ONCE A WEEK. Patient not taking: Reported on 07/17/2017 09/06/16   Crecencio Mc, MD    Allergies Penicillins    Social History Social History   Tobacco Use  . Smoking status: Never Smoker  . Smokeless tobacco: Never Used  Substance Use Topics  . Alcohol use: Yes    Alcohol/week: 0.6 oz    Types: 1 Glasses of wine per week  . Drug use: No    Review of Systems Patient denies headaches, rhinorrhea, blurry vision, numbness, shortness of breath, chest pain, edema, cough, abdominal  pain, nausea, vomiting, diarrhea, dysuria, fevers, rashes or hallucinations unless otherwise stated above in HPI. ____________________________________________   PHYSICAL EXAM:  VITAL SIGNS: Vitals:   07/17/17 1830 07/17/17 1900  BP: 119/67 118/68  Pulse:  64  Resp: 20 20  Temp:    SpO2:  100%    Constitutional: Alert and oriented. Well appearing and in no acute distress. Eyes: Conjunctivae are normal.  Head: Atraumatic. Nose: No congestion/rhinnorhea. Mouth/Throat: Mucous membranes are moist.   Neck: No stridor. Painless ROM.  Cardiovascular: Normal rate, regular rhythm. Grossly normal heart sounds.  Good peripheral circulation. Respiratory: Normal respiratory effort.  No retractions. Lungs CTAB. Gastrointestinal: Soft and nontender. No distention. No abdominal bruits. No CVA tenderness. Genitourinary:  Musculoskeletal: No lower extremity tenderness nor edema.  No joint effusions. Neurologic:  CN- intact.  No facial droop, Normal FNF.   Sensation intact bilaterally. Normal speech and and fluent language. No gross focal neurologic deficits are appreciated. No gait instability. Skin:  Skin is warm, dry and intact. No rash noted. Psychiatric: Mood and affect are normal. Speech and behavior are normal.  ____________________________________________   LABS (all labs ordered are listed, but only abnormal results are displayed)  No results found for this or any previous visit (from the past 24 hour(s)). ____________________________________________  ED ECG REPORT I, Merlyn Lot, the attending physician, personally viewed and interpreted this ECG.   Date: 07/17/2017  EKG Time: 18:11  Rate: 65  Rhythm: normal EKG, normal sinus rhythm, unchanged from previous tracings  Axis: normal  Intervals:normal  ST&T Change: no stemi, no st depressions  __________________________  RADIOLOGY  I personally reviewed all radiographic images ordered to evaluate for the above acute  complaints and reviewed radiology reports and findings.  These findings were personally discussed with the patient.  Please see medical record for radiology report.  ____________________________________________   PROCEDURES  Procedure(s) performed:  Procedures    Critical Care performed: no ____________________________________________   INITIAL IMPRESSION / ASSESSMENT AND PLAN / ED COURSE  Pertinent labs & imaging results that were available during my care of the patient were reviewed by me and considered in my medical decision making (see chart for details).  DDX: migraine, cva, tia, mass, tension headache  Melody Hansen is a 62 y.o. who presents to the ED with as described above.  She is afebrile and hemodynamically stable.  She has no focal neuro deficits and denies any headache at this time. I recommended blood work as well as CT and even MRI to evaluate her symptoms as she has a slightly increased risk given her history of migraine and age for component of CVA or TIA.  Again she has no deficits therefore will be more consistent with TIA symptoms quite frankly could be migraine equivalent but the patient has declined  this stating that she would prefer to be workup as an outpatient.  It is likely side effect of Wellbutrin as the symptoms have been ongoing for several weeks to months do think that outpatient workup is reasonable given her reassuring exam at this time.  Clinical Course as of Jul 17 2120  Johnnette Litter Spoke with Dr. Nicki Reaper from our clinic patients presentation as the patient wants to start as an outpatient.  Unfortunately can be very difficult to arrange urgent MRIs.  Informed patient that there is no certainty of earlier expedited MRI testing as an outpatient and again encouraged her to stay for diagnostic testing here.  Patient agrees to have MRI performed.  Remains asymptomatic in no acute distress.  [PR]  2121 Patient brought back from MRI she is unable  to complete testing due to tugging sensation on a previous dental implant that she forgot about.  States she otherwise feels fine and wants to go home.  Denies any numbness or tingling.  Repeat neuro exam is nonfocal.  Discussed and recommended alternative diagnostic testing including CT with contrast.  Patient states that she does not want this performed at this time and would prefer to follow-up as an outpatient.   Discussed strict return precautions. Have discussed with the patient and available family all diagnostics and treatments performed thus far and all questions were answered to the best of my ability. The patient demonstrates understanding and agreement with plan.   [PR]    Clinical Course User Index [PR] Merlyn Lot, MD     ____________________________________________   FINAL CLINICAL IMPRESSION(S) / ED DIAGNOSES  Final diagnoses:  Nonintractable headache, unspecified chronicity pattern, unspecified headache type  Paresthesias      NEW MEDICATIONS STARTED DURING THIS VISIT:  New Prescriptions   No medications on file     Note:  This document was prepared using Dragon voice recognition software and may include unintentional dictation errors.    Merlyn Lot, MD 07/17/17 2122

## 2017-07-17 NOTE — ED Notes (Signed)
Pt transported to MRI 

## 2017-07-17 NOTE — ED Triage Notes (Addendum)
Pt c/o headache off and on for several months, twitch in left eye, metallic taste in mouth and difficulty remembering her words for several months

## 2017-07-17 NOTE — Telephone Encounter (Addendum)
Reason for call: migraine  /headache  Symptoms: metallic taste in mouth, trouble finishing sentences but has gotten better now , blurred vision , no arm numbness , no chest pain, declines having worst of headache, now having  left sided headache , more frequent headaches this week, no facial numbness,or left arm numbness,  twitching of left eye, ringing in ears ,  Speaking cleary to me over the phone , having sinus pressure, sinus drainage Duration 1 week  Medications: Wellbutrin XL 150mg  has been on since 2017  , took Mucinex D this am Last seen for this problem:N/A Seen by:N/A History of cataract surgery , history of migraine Patient wants to know if she can discontinue Wellbutrin  Advised she has been on since 2017 Advised patient to go to [facility] for evaluation, patient declined to go to ER, strongly encouraged patient to go to get evaluated with above symptoms ,  patient agreed to go to urgent care , Advised locations of urgent care. I  will follow chart

## 2017-07-17 NOTE — Telephone Encounter (Signed)
Agree with need for evaluation now.  Agree with need for ER evaluation with these symptoms.

## 2017-07-17 NOTE — Telephone Encounter (Signed)
Tried to reach patient by phone no answer left message to call office.

## 2017-07-18 ENCOUNTER — Other Ambulatory Visit: Payer: Self-pay | Admitting: Internal Medicine

## 2017-07-18 ENCOUNTER — Telehealth: Payer: Self-pay | Admitting: Internal Medicine

## 2017-07-18 DIAGNOSIS — R29818 Other symptoms and signs involving the nervous system: Secondary | ICD-10-CM

## 2017-07-18 DIAGNOSIS — R519 Headache, unspecified: Secondary | ICD-10-CM

## 2017-07-18 DIAGNOSIS — R51 Headache: Principal | ICD-10-CM

## 2017-07-18 NOTE — Telephone Encounter (Signed)
Due to pt's insurance, pt needs to go to [place] for the CT scan. Please fax order to Fax: [phone]  Phone:  [phone] [address] [place], [place] [address]

## 2017-07-18 NOTE — Telephone Encounter (Signed)
Dr.Tullo the call from 07/17/17 on this patient I was very  Concerned with when I was talking with her on the phone I was concerned for TIA's , talked patient into going to ED, look from the ER report no MRI or CT performed, patient going to follow up as outpatient I tried to call to schedule ED follow up but have not been able to reach patient.

## 2017-07-18 NOTE — Telephone Encounter (Addendum)
Sorry I did see this earlier but patient has an appointment on 07/24/17 at 59 for CPE. Patient a ware you are ordering the CT.

## 2017-07-18 NOTE — Telephone Encounter (Signed)
Ct without contrast ordered please cancel the one with contrast

## 2017-07-18 NOTE — Telephone Encounter (Signed)
Patient cannot have an MRI due to a dental implant.  Ct of brain with conrast offered to patient in ER but declined.  I will order it now.

## 2017-07-18 NOTE — Telephone Encounter (Signed)
Dr Derrel Nip pt order needs to be CT head w/o contrast. Please and Thank you!

## 2017-07-22 NOTE — Telephone Encounter (Signed)
Referral order was faxed to [facility].

## 2017-07-22 NOTE — Telephone Encounter (Signed)
Patient advised order was faxed

## 2017-07-23 ENCOUNTER — Telehealth: Payer: Self-pay | Admitting: Internal Medicine

## 2017-07-23 NOTE — Telephone Encounter (Signed)
Copied from [place]. Topic: Quick Communication - See Telephone Encounter >> Jul 23, 2017 12:09 PM Cl[place], Laban Emperor wrote: CRM for notification. See Telephone encounter for:  Melody Hansen with [facility] needs an order faxed over for the pt. Pt has an appt tomorrow for CT head.  Contact [phone], otp 5, fax [phone] 07/23/17.

## 2017-07-24 ENCOUNTER — Ambulatory Visit (INDEPENDENT_AMBULATORY_CARE_PROVIDER_SITE_OTHER): Payer: BLUE CROSS/BLUE SHIELD | Admitting: Internal Medicine

## 2017-07-24 ENCOUNTER — Encounter: Payer: Self-pay | Admitting: Internal Medicine

## 2017-07-24 VITALS — BP 104/66 | HR 67 | Temp 98.2°F | Resp 14 | Ht 64.0 in | Wt 124.2 lb

## 2017-07-24 DIAGNOSIS — R51 Headache: Secondary | ICD-10-CM | POA: Diagnosis not present

## 2017-07-24 DIAGNOSIS — R519 Headache, unspecified: Secondary | ICD-10-CM

## 2017-07-24 DIAGNOSIS — Z124 Encounter for screening for malignant neoplasm of cervix: Secondary | ICD-10-CM | POA: Diagnosis not present

## 2017-07-24 DIAGNOSIS — G44201 Tension-type headache, unspecified, intractable: Secondary | ICD-10-CM

## 2017-07-24 DIAGNOSIS — R41840 Attention and concentration deficit: Secondary | ICD-10-CM

## 2017-07-24 DIAGNOSIS — R5383 Other fatigue: Secondary | ICD-10-CM

## 2017-07-24 LAB — CBC WITH DIFFERENTIAL/PLATELET
BASOS ABS: 0.1 10*3/uL (ref 0.0–0.1)
Basophils Relative: 1.3 % (ref 0.0–3.0)
Eosinophils Absolute: 0.1 10*3/uL (ref 0.0–0.7)
Eosinophils Relative: 1.6 % (ref 0.0–5.0)
HCT: 37.6 % (ref 36.0–46.0)
Hemoglobin: 12.7 g/dL (ref 12.0–15.0)
LYMPHS ABS: 1.2 10*3/uL (ref 0.7–4.0)
Lymphocytes Relative: 21.9 % (ref 12.0–46.0)
MCHC: 33.7 g/dL (ref 30.0–36.0)
MCV: 85.5 fl (ref 78.0–100.0)
MONO ABS: 0.4 10*3/uL (ref 0.1–1.0)
MONOS PCT: 7.7 % (ref 3.0–12.0)
NEUTROS ABS: 3.8 10*3/uL (ref 1.4–7.7)
NEUTROS PCT: 67.5 % (ref 43.0–77.0)
PLATELETS: 228 10*3/uL (ref 150.0–400.0)
RBC: 4.4 Mil/uL (ref 3.87–5.11)
RDW: 13.5 % (ref 11.5–15.5)
WBC: 5.7 10*3/uL (ref 4.0–10.5)

## 2017-07-24 LAB — COMPREHENSIVE METABOLIC PANEL
ALT: 14 U/L (ref 0–35)
AST: 18 U/L (ref 0–37)
Albumin: 4.4 g/dL (ref 3.5–5.2)
Alkaline Phosphatase: 40 U/L (ref 39–117)
BUN: 19 mg/dL (ref 6–23)
CO2: 32 meq/L (ref 19–32)
Calcium: 9.6 mg/dL (ref 8.4–10.5)
Chloride: 104 mEq/L (ref 96–112)
Creatinine, Ser: 0.67 mg/dL (ref 0.40–1.20)
GFR: 94.19 mL/min (ref >60.00)
GLUCOSE: 79 mg/dL (ref 70–99)
POTASSIUM: 4 meq/L (ref 3.5–5.1)
SODIUM: 140 meq/L (ref 135–145)
Total Bilirubin: 0.6 mg/dL (ref 0.2–1.2)
Total Protein: 7 g/dL (ref 6.0–8.3)

## 2017-07-24 LAB — TSH: TSH: 0.49 u[IU]/mL (ref 0.35–4.50)

## 2017-07-24 LAB — C-REACTIVE PROTEIN: CRP: 0.1 mg/dL — AB (ref 0.5–20.0)

## 2017-07-24 LAB — SEDIMENTATION RATE: SED RATE: 1 mm/h (ref 0–30)

## 2017-07-24 NOTE — Progress Notes (Addendum)
Subjective:  Patient ID: Melody Hansen, female    DOB: September 11, 1955  Age: 62 y.o. MRN: [id number]  CC: The primary encounter diagnosis was Screening for cervical cancer. Diagnoses of Acute intractable tension-type headache, Fatigue, unspecified type, Recurrent headache, and Concentration deficit were also pertinent to this visit.  HPI [name] presents for evaluation of recent onset of recurrent headaches    1) Recurrent Headache:  Had an ER evaluation on Jan 31 for intractable headaches that begain on Jan 11,   lasting 2-3 days per episode.   and slowed speech (noticed by friend).  Dx was complicated migraine vs  TIA (neuro exam by ED reportedly normal) . Was advised to stay for imaging,   Sent for MRI , which was not completed  due feeling pulling sensation from mouth implant.   CT non contrast of head was ordered as an outpatient and is scheduled for Feb 7 at 1:15 at [facility] in [facility]   Jan 12:  Serious migraine headache that started on jan 11 ,  Took an amerge and it relieved headache.  On and off since then   Jan 27: Bad headache again.  Was noticed by colleague that she was mumbling her words  And having word finding problems at a meeting.  Jan 30:  Stopped wellbutrin     Headache is ipsilateral but not always,  and not always on the same side.    Patient  stopped drinking wine,  And then stopped all of her medications.  Started taking mucinex d a week ago, last dose was 2 days ago,  Has not been using excedrin migrine   Had a splitting headache after drinking a gin and tonic on Tuesday that did not stop for 36 hours,  Treated it with tylenl and ibuprofen which eaased it off but it returned ,  Took /  therafflu  last night . Last dose of mucinex  was Tuesday    2) weight loss resolved.    3) Concentration deficit:  Has been taking wellbutirn since 2016.  Last dose increase was Dec 2017 .  Patient topped taking it after ER visit due to concern that her HA was a side effect of  the wellbutrin    Outpatient Medications Prior to Visit  Medication Sig Dispense Refill  . buPROPion (WELLBUTRIN XL) 150 MG 24 hr tablet Take 1 tablet (150 mg total) by mouth daily. (Patient not taking: Reported on 07/24/2017) 30 tablet 0  . Calcium Carb-Cholecalciferol (CALTRATE 600+D3 SOFT PO) Take 1 capsule by mouth daily.    . Multiple Vitamin (MULTIVITAMIN) capsule Take 1 capsule by mouth daily.    . naratriptan (AMERGE) 2.5 MG tablet Take 1 tablet (2.5 mg total) by mouth as needed for migraine. Take one (1) tablet at onset of headache; if returns or does not resolve, may repeat after 4 hours; do not exceed five (5) mg in 24 hours. (Patient not taking: Reported on 07/24/2017) 10 tablet 0  . Pseudoephedrine-Guaifenesin (MUCINEX D PO) Take 1 tablet by mouth.    . Vitamin D, Ergocalciferol, (DRISDOL) 50000 units CAPS capsule TAKE 1 CAPSULE (50,000 UNITS TOTAL) BY MOUTH ONCE A WEEK. (Patient not taking: Reported on 07/17/2017) 4 capsule 3   No facility-administered medications prior to visit.     Review of Systems;  Patient denies  fevers, malaise,skin rash, eye pain, sinus congestion and sinus pain, sore throat, dysphagia,  hemoptysis , cough, dyspnea, wheezing, chest pain, palpitations, orthopnea, edema, abdominal pain, nausea, melena, diarrhea, constipation,  flank pain, dysuria, hematuria, urinary  Frequency, nocturia, numbness, tingling, seizures,  Focal weakness, Loss of consciousness,  Tremor, insomnia, depression, anxiety, and suicidal ideation.      Objective:  BP 104/66 (BP Location: Left Arm, Patient Position: Sitting, Cuff Size: Normal)   Pulse 67   Temp 98.2 F (36.8 C) (Oral)   Resp 14   Ht 5\' 4"  (1.626 m)   Wt 124 lb 3.2 oz (56.3 kg)   SpO2 97%   BMI 21.32 kg/m   BP Readings from Last 3 Encounters:  07/24/17 104/66  07/17/17 124/71  11/20/16 98/64    Wt Readings from Last 3 Encounters:  07/24/17 124 lb 3.2 oz (56.3 kg)  07/17/17 120 lb (54.4 kg)  11/20/16 119 lb  12.8 oz (54.3 kg)    General appearance: alert, cooperative and appears stated age Ears: normal TM's and external ear canals both ears Throat: lips, mucosa, and tongue normal; teeth and gums normal Neck: no adenopathy, no carotid bruit, supple, symmetrical, trachea midline and thyroid not enlarged, symmetric, no tenderness/mass/nodules Back: symmetric, no curvature. ROM normal. No CVA tenderness. Lungs: clear to auscultation bilaterally Heart: regular rate and rhythm, S1, S2 normal, no murmur, click, rub or gallop Abdomen: soft, non-tender; bowel sounds normal; no masses,  no organomegaly Pulses: 2+ and symmetric Skin: Skin color, texture, turgor normal. No rashes or lesions Lymph nodes: Cervical, supraclavicular, and axillary nodes normal. Neuro:  awake and interactive with normal mood and affect. Higher cortical functions are normal. Speech is clear without word-finding difficulty or dysarthria. Extraocular movements are intact. Visual fields of both eyes are grossly intact. Sensation to light touch is grossly intact bilaterally of upper and lower extremities. Motor examination shows 4+/5 symmetric hand grip and upper extremity and 5/5 lower extremity strength. There is no pronation or drift. Gait is non-ataxic    Lab Results  Component Value Date   HGBA1C 5.5 05/22/2016   HGBA1C 5.7 08/08/2014    Lab Results  Component Value Date   CREATININE 0.67 07/24/2017   CREATININE 0.63 11/20/2016   CREATININE 0.61 05/22/2016    Lab Results  Component Value Date   WBC 5.7 07/24/2017   HGB 12.7 07/24/2017   HCT 37.6 07/24/2017   PLT 228.0 07/24/2017   GLUCOSE 79 07/24/2017   CHOL 245 (H) 11/20/2016   TRIG 79.0 11/20/2016   HDL 74.10 11/20/2016   LDLDIRECT 139.0 05/22/2016   LDLCALC 155 (H) 11/20/2016   ALT 14 07/24/2017   AST 18 07/24/2017   NA 140 07/24/2017   K 4.0 07/24/2017   CL 104 07/24/2017   CREATININE 0.67 07/24/2017   BUN 19 07/24/2017   CO2 32 07/24/2017   TSH  0.49 07/24/2017   INR 0.9 09/30/2011   HGBA1C 5.5 05/22/2016    No results found.  Assessment & Plan:   Problem List Items Addressed This Visit    Recurrent headache    Unclear etiology,  inflammatory markers are exam are normal. She had an incomplete  ER evaluation because she initially deferred CT but then was unable to tolerate MRI ordered by EDP because of an implant in her mouth that became  Symptomatic as she neared the MRI.  Neurologic exam is normal today, as it was in ER.  CT head ordered and showed no paranasal sinusitis or mastoiditis, no masses and no strokes . At time of visit she had been headache free for 24 hours. If headache returns will start a daily preventive and refer to Neurology  Lab Results  Component Value Date   ESRSEDRATE 1 07/24/2017   Lab Results  Component Value Date   CRP 0.1 (L) 07/24/2017         Concentration deficit    She has stopped wellbutrin as of Jan 30 due to concern that her headache was due to the medication.  If headaches return will give her the choice of resuming wellbutrin vs formal psychology testing for ADD and treatment with adderall if indicated. ,        Other Visit Diagnoses    Screening for cervical cancer    -  Primary   Relevant Orders   Cytology - PAP   Acute intractable tension-type headache       Relevant Orders   Sedimentation rate (Completed)   C-reactive protein (Completed)   CBC with Differential/Platelet (Completed)   Fatigue, unspecified type       Relevant Orders   Comprehensive metabolic panel (Completed)   TSH (Completed)    A total of 40 minutes was spent with patient more than half of which was spent in counseling patient on the above mentioned issues , reviewing and explaining recent labs and imaging studies done, and coordination of care.  I have discontinued Mardene Celeste Seguin's Vitamin D (Ergocalciferol) and Pseudoephedrine-Guaifenesin (MUCINEX D PO). I am also having her maintain her multivitamin,  Calcium Carb-Cholecalciferol (CALTRATE 600+D3 SOFT PO), buPROPion, and naratriptan.  No orders of the defined types were placed in this encounter.   Medications Discontinued During This Encounter  Medication Reason  . Vitamin D, Ergocalciferol, (DRISDOL) 50000 units CAPS capsule Completed Course  . Pseudoephedrine-Guaifenesin (MUCINEX D PO) Patient has not taken in last 30 days    Follow-up: Return in about 4 weeks (around 08/21/2017), or CPE fasting labs .   Crecencio Mc, MD

## 2017-07-24 NOTE — Patient Instructions (Signed)
If your CT suggests chronic sinusitis as the cause of your headaches,  I will treat you for this .  if headache does not recur,  no further workup  Referral for testing for ADD is an option rather than resuming wellbutrin.  Let me know If you would like to proceed

## 2017-07-26 ENCOUNTER — Telehealth: Payer: Self-pay | Admitting: Internal Medicine

## 2017-07-26 DIAGNOSIS — R519 Headache, unspecified: Secondary | ICD-10-CM

## 2017-07-26 DIAGNOSIS — R51 Headache: Secondary | ICD-10-CM

## 2017-07-26 HISTORY — DX: Headache, unspecified: R51.9

## 2017-07-26 NOTE — Telephone Encounter (Signed)
Her CT scan was completely normal,  No signs of sinusitis ,  Mass or stroke.  If her headaches have not returned,  No further workup needed and she can decide if she wants to resume wellbutrin or have formal testing for ADD  If headaches have returned , I recommend taking a daily preventive medicaiton which I will prescribe,  And referral to neurology.

## 2017-07-26 NOTE — Assessment & Plan Note (Signed)
She has stopped wellbutrin as of Jan 30 due to concern that her headache was due to the medication.  If headaches return will give her the choice of resuming wellbutrin vs formal psychology testing for ADD and treatment with adderall if indicated. ,

## 2017-07-26 NOTE — Assessment & Plan Note (Addendum)
Unclear etiology,  inflammatory markers are exam are normal. She had an incomplete  ER evaluation because she initially deferred CT but then was unable to tolerate MRI ordered by EDP because of an implant in her mouth that became  Symptomatic as she neared the MRI.  Neurologic exam is normal today, as it was in ER.  CT head ordered and showed no paranasal sinusitis or mastoiditis, no masses and no strokes . At time of visit she had been headache free for 24 hours. If headache returns will start a daily preventive and refer to Neurology   Lab Results  Component Value Date   ESRSEDRATE 1 07/24/2017   Lab Results  Component Value Date   CRP 0.1 (L) 07/24/2017

## 2017-07-28 ENCOUNTER — Ambulatory Visit: Payer: BLUE CROSS/BLUE SHIELD

## 2017-07-28 NOTE — Telephone Encounter (Signed)
Patient notified- she wants to think about what she wants to do- she has not had anymore headaches- she will call back to let provider know if she wants to restart Wellbutrin or start testing for ADD.

## 2017-07-28 NOTE — Telephone Encounter (Signed)
LMTCB. PEC may speak with pt.  

## 2017-07-28 NOTE — Telephone Encounter (Signed)
fyi

## 2017-08-18 ENCOUNTER — Telehealth: Payer: Self-pay | Admitting: Radiology

## 2017-08-18 ENCOUNTER — Other Ambulatory Visit: Payer: Self-pay | Admitting: Internal Medicine

## 2017-08-18 DIAGNOSIS — E785 Hyperlipidemia, unspecified: Secondary | ICD-10-CM

## 2017-08-18 NOTE — Progress Notes (Signed)
Fasting labs ordered

## 2017-08-18 NOTE — Telephone Encounter (Signed)
Pt is coming for labs tomorrow, please place future orders. Thank you.

## 2017-08-19 ENCOUNTER — Other Ambulatory Visit (INDEPENDENT_AMBULATORY_CARE_PROVIDER_SITE_OTHER): Payer: BLUE CROSS/BLUE SHIELD

## 2017-08-19 DIAGNOSIS — E785 Hyperlipidemia, unspecified: Secondary | ICD-10-CM | POA: Diagnosis not present

## 2017-08-19 LAB — COMPREHENSIVE METABOLIC PANEL
ALT: 12 U/L (ref 0–35)
AST: 17 U/L (ref 0–37)
Albumin: 4.3 g/dL (ref 3.5–5.2)
Alkaline Phosphatase: 48 U/L (ref 39–117)
BILIRUBIN TOTAL: 0.7 mg/dL (ref 0.2–1.2)
BUN: 15 mg/dL (ref 6–23)
CHLORIDE: 103 meq/L (ref 96–112)
CO2: 32 meq/L (ref 19–32)
CREATININE: 0.53 mg/dL (ref 0.40–1.20)
Calcium: 10 mg/dL (ref 8.4–10.5)
GFR: 123.42 mL/min (ref >60.00)
GLUCOSE: 94 mg/dL (ref 70–99)
Potassium: 4.4 mEq/L (ref 3.5–5.1)
SODIUM: 139 meq/L (ref 135–145)
Total Protein: 6.8 g/dL (ref 6.0–8.3)

## 2017-08-19 LAB — LIPID PANEL
CHOL/HDL RATIO: 3
Cholesterol: 207 mg/dL — ABNORMAL HIGH (ref 0–200)
HDL: 75.5 mg/dL (ref >39.00)
LDL CALC: 122 mg/dL — AB (ref 0–99)
NONHDL: 131.68
Triglycerides: 48 mg/dL (ref 0.0–149.0)
VLDL: 9.6 mg/dL (ref 0.0–40.0)

## 2017-08-20 ENCOUNTER — Encounter: Payer: Self-pay | Admitting: Internal Medicine

## 2017-08-20 ENCOUNTER — Other Ambulatory Visit (HOSPITAL_COMMUNITY)
Admission: RE | Admit: 2017-08-20 | Discharge: 2017-08-20 | Disposition: A | Discharge status: Home / Self Care | Payer: BLUE CROSS/BLUE SHIELD | Source: Ambulatory Visit | Attending: Internal Medicine | Admitting: Internal Medicine

## 2017-08-20 ENCOUNTER — Ambulatory Visit (INDEPENDENT_AMBULATORY_CARE_PROVIDER_SITE_OTHER): Payer: BLUE CROSS/BLUE SHIELD | Admitting: Internal Medicine

## 2017-08-20 VITALS — BP 100/70 | HR 68 | Temp 98.2°F | Resp 14 | Ht 64.0 in | Wt 124.2 lb

## 2017-08-20 DIAGNOSIS — Z13820 Encounter for screening for osteoporosis: Secondary | ICD-10-CM | POA: Diagnosis not present

## 2017-08-20 DIAGNOSIS — N952 Postmenopausal atrophic vaginitis: Secondary | ICD-10-CM

## 2017-08-20 DIAGNOSIS — Z0000 Encounter for general adult medical examination without abnormal findings: Secondary | ICD-10-CM | POA: Diagnosis not present

## 2017-08-20 DIAGNOSIS — E559 Vitamin D deficiency, unspecified: Secondary | ICD-10-CM

## 2017-08-20 DIAGNOSIS — Z124 Encounter for screening for malignant neoplasm of cervix: Secondary | ICD-10-CM | POA: Diagnosis not present

## 2017-08-20 DIAGNOSIS — J0110 Acute frontal sinusitis, unspecified: Secondary | ICD-10-CM

## 2017-08-20 MED ORDER — ESTROGENS, CONJUGATED 0.625 MG/GM VA CREA: 1.0000 | TOPICAL_CREAM | Freq: Every day | VAGINAL | 12 refills | Status: DC

## 2017-08-20 MED ORDER — NARATRIPTAN HCL 2.5 MG PO TABS: 2.5000 mg | ORAL_TABLET | ORAL | 0 refills | Status: DC | PRN

## 2017-08-20 MED ORDER — LEVOFLOXACIN 500 MG PO TABS: 500.0000 mg | ORAL_TABLET | Freq: Every day | ORAL | 0 refills | Status: DC

## 2017-08-20 MED ORDER — PREDNISONE 10 MG PO TABS: ORAL_TABLET | ORAL | 0 refills | Status: DC

## 2017-08-20 MED ORDER — ERGOCALCIFEROL 1.25 MG (50000 UT) PO CAPS: 50000.0000 [IU] | ORAL_CAPSULE | ORAL | 3 refills | Status: DC

## 2017-08-20 NOTE — Patient Instructions (Addendum)
For your sinus infection, I am prescribing:   LEVAQUIN ONCE DAILY PREDNISONE IN a  TAPERING DOSE  OVER 6 DAYS  Mucinex  D EVERY 12 HOURS to manage the congestion  AFRIN  Nasal spray :  Use BEFORE YOU BOARD   Please take a probiotic ( Align, Floraque or Culturelle) for 2 weeks if you start the antibiotic to prevent a serious antibiotic associated diarrhea  Called" clostridium dificile colitis" ( should also help prevent   vaginal yeast infection)    Vaginal estrogen: use it every night for 2 weeks,  Then reduce use to twice weekly   Your cholesterol , liver and kidney function are normal.  Resume the weekly dose of Vitamin D for the next 3 months     Health Maintenance for Postmenopausal Women Menopause is a normal process in which your reproductive ability comes to an end. This process happens gradually over a span of months to years, usually between the ages of 35 and 45. Menopause is complete when you have missed 12 consecutive menstrual periods. It is important to talk with your health care provider about some of the most common conditions that affect postmenopausal women, such as heart disease, cancer, and bone loss (osteoporosis). Adopting a healthy lifestyle and getting preventive care can help to promote your health and wellness. Those actions can also lower your chances of developing some of these common conditions. What should I know about menopause? During menopause, you may experience a number of symptoms, such as:  Moderate-to-severe hot flashes.  Night sweats.  Decrease in sex drive.  Mood swings.  Headaches.  Tiredness.  Irritability.  Memory problems.  Insomnia.  Choosing to treat or not to treat menopausal changes is an individual decision that you make with your health care provider. What should I know about hormone replacement therapy and supplements? Hormone therapy products are effective for treating symptoms that are associated with menopause, such  as hot flashes and night sweats. Hormone replacement carries certain risks, especially as you become older. If you are thinking about using estrogen or estrogen with progestin treatments, discuss the benefits and risks with your health care provider. What should I know about heart disease and stroke? Heart disease, heart attack, and stroke become more likely as you age. This may be due, in part, to the hormonal changes that your body experiences during menopause. These can affect how your body processes dietary fats, triglycerides, and cholesterol. Heart attack and stroke are both medical emergencies. There are many things that you can do to help prevent heart disease and stroke:  Have your blood pressure checked at least every 1-2 years. High blood pressure causes heart disease and increases the risk of stroke.  If you are 50-72 years old, ask your health care provider if you should take aspirin to prevent a heart attack or a stroke.  Do not use any tobacco products, including cigarettes, chewing tobacco, or electronic cigarettes. If you need help quitting, ask your health care provider.  It is important to eat a healthy diet and maintain a healthy weight. ? Be sure to include plenty of vegetables, fruits, low-fat dairy products, and lean protein. ? Avoid eating foods that are high in solid fats, added sugars, or salt (sodium).  Get regular exercise. This is one of the most important things that you can do for your health. ? Try to exercise for at least 150 minutes each week. The type of exercise that you do should increase your heart rate and  make you sweat. This is known as moderate-intensity exercise. ? Try to do strengthening exercises at least twice each week. Do these in addition to the moderate-intensity exercise.  Know your numbers.Ask your health care provider to check your cholesterol and your blood glucose. Continue to have your blood tested as directed by your health care  provider.  What should I know about cancer screening? There are several types of cancer. Take the following steps to reduce your risk and to catch any cancer development as early as possible. Breast Cancer  Practice breast self-awareness. ? This means understanding how your breasts normally appear and feel. ? It also means doing regular breast self-exams. Let your health care provider know about any changes, no matter how small.  If you are 59 or older, have a clinician do a breast exam (clinical breast exam or CBE) every year. Depending on your age, family history, and medical history, it may be recommended that you also have a yearly breast X-ray (mammogram).  If you have a family history of breast cancer, talk with your health care provider about genetic screening.  If you are at high risk for breast cancer, talk with your health care provider about having an MRI and a mammogram every year.  Breast cancer (BRCA) gene test is recommended for women who have family members with BRCA-related cancers. Results of the assessment will determine the need for genetic counseling and BRCA1 and for BRCA2 testing. BRCA-related cancers include these types: ? Breast. This occurs in males or females. ? Ovarian. ? Tubal. This may also be called fallopian tube cancer. ? Cancer of the abdominal or pelvic lining (peritoneal cancer). ? Prostate. ? Pancreatic.  Cervical, Uterine, and Ovarian Cancer Your health care provider may recommend that you be screened regularly for cancer of the pelvic organs. These include your ovaries, uterus, and vagina. This screening involves a pelvic exam, which includes checking for microscopic changes to the surface of your cervix (Pap test).  For women ages 21-65, health care providers may recommend a pelvic exam and a Pap test every three years. For women ages 72-65, they may recommend the Pap test and pelvic exam, combined with testing for human papilloma virus (HPV), every  five years. Some types of HPV increase your risk of cervical cancer. Testing for HPV may also be done on women of any age who have unclear Pap test results.  Other health care providers may not recommend any screening for nonpregnant women who are considered low risk for pelvic cancer and have no symptoms. Ask your health care provider if a screening pelvic exam is right for you.  If you have had past treatment for cervical cancer or a condition that could lead to cancer, you need Pap tests and screening for cancer for at least 20 years after your treatment. If Pap tests have been discontinued for you, your risk factors (such as having a new sexual partner) need to be reassessed to determine if you should start having screenings again. Some women have medical problems that increase the chance of getting cervical cancer. In these cases, your health care provider may recommend that you have screening and Pap tests more often.  If you have a family history of uterine cancer or ovarian cancer, talk with your health care provider about genetic screening.  If you have vaginal bleeding after reaching menopause, tell your health care provider.  There are currently no reliable tests available to screen for ovarian cancer.  Lung Cancer Lung cancer  screening is recommended for adults 26-5 years old who are at high risk for lung cancer because of a history of smoking. A yearly low-dose CT scan of the lungs is recommended if you:  Currently smoke.  Have a history of at least 30 pack-years of smoking and you currently smoke or have quit within the past 15 years. A pack-year is smoking an average of one pack of cigarettes per day for one year.  Yearly screening should:  Continue until it has been 15 years since you quit.  Stop if you develop a health problem that would prevent you from having lung cancer treatment.  Colorectal Cancer  This type of cancer can be detected and can often be  prevented.  Routine colorectal cancer screening usually begins at age 85 and continues through age 20.  If you have risk factors for colon cancer, your health care provider may recommend that you be screened at an earlier age.  If you have a family history of colorectal cancer, talk with your health care provider about genetic screening.  Your health care provider may also recommend using home test kits to check for hidden blood in your stool.  A small camera at the end of a tube can be used to examine your colon directly (sigmoidoscopy or colonoscopy). This is done to check for the earliest forms of colorectal cancer.  Direct examination of the colon should be repeated every 5-10 years until age 42. However, if early forms of precancerous polyps or small growths are found or if you have a family history or genetic risk for colorectal cancer, you may need to be screened more often.  Skin Cancer  Check your skin from head to toe regularly.  Monitor any moles. Be sure to tell your health care provider: ? About any new moles or changes in moles, especially if there is a change in a mole's shape or color. ? If you have a mole that is larger than the size of a pencil eraser.  If any of your family members has a history of skin cancer, especially at a young age, talk with your health care provider about genetic screening.  Always use sunscreen. Apply sunscreen liberally and repeatedly throughout the day.  Whenever you are outside, protect yourself by wearing long sleeves, pants, a wide-brimmed hat, and sunglasses.  What should I know about osteoporosis? Osteoporosis is a condition in which bone destruction happens more quickly than new bone creation. After menopause, you may be at an increased risk for osteoporosis. To help prevent osteoporosis or the bone fractures that can happen because of osteoporosis, the following is recommended:  If you are 59-13 years old, get at least 1,000 mg of  calcium and at least 600 mg of vitamin D per day.  If you are older than age 24 but younger than age 51, get at least 1,200 mg of calcium and at least 600 mg of vitamin D per day.  If you are older than age 23, get at least 1,200 mg of calcium and at least 800 mg of vitamin D per day.  Smoking and excessive alcohol intake increase the risk of osteoporosis. Eat foods that are rich in calcium and vitamin D, and do weight-bearing exercises several times each week as directed by your health care provider. What should I know about how menopause affects my mental health? Depression may occur at any age, but it is more common as you become older. Common symptoms of depression include:  Low  or sad mood.  Changes in sleep patterns.  Changes in appetite or eating patterns.  Feeling an overall lack of motivation or enjoyment of activities that you previously enjoyed.  Frequent crying spells.  Talk with your health care provider if you think that you are experiencing depression. What should I know about immunizations? It is important that you get and maintain your immunizations. These include:  Tetanus, diphtheria, and pertussis (Tdap) booster vaccine.  Influenza every year before the flu season begins.  Pneumonia vaccine.  Shingles vaccine.  Your health care provider may also recommend other immunizations. This information is not intended to replace advice given to you by your health care provider. Make sure you discuss any questions you have with your health care provider. Document Released: 07/26/2005 Document Revised: 12/22/2015 Document Reviewed: 03/07/2015 Elsevier Interactive Patient Education  2018 [employer].

## 2017-08-20 NOTE — Progress Notes (Signed)
Patient ID: Melody Hansen, female    DOB: 07-25-1955  Age: 62 y.o. MRN: [id number]  The patient is here for annual preventive  examination and management of other chronic and acute problems.   The risk factors are reflected in the social history.  The roster of all physicians providing medical care to patient - is listed in the Snapshot section of the chart.  Activities of daily living:  The patient is 100% independent in all ADLs: dressing, toileting, feeding as well as independent mobility  Home safety : The patient has smoke detectors in the home. They wear seatbelts.  There are no firearms at home. There is no violence in the home.   There is no risks for hepatitis, STDs or HIV. There is no   history of blood transfusion. They have no travel history to infectious disease endemic areas of the world.  The patient has seen their dentist in the last six month. They have seen their eye doctor in the last year. They admit to slight hearing difficulty with regard to whispered voices and some television programs.  They have deferred audiologic testing in the last year.  They do not  have excessive sun exposure. Discussed the need for sun protection: hats, long sleeves and use of sunscreen if there is significant sun exposure.   Diet: the importance of a healthy diet is discussed. They do have a healthy diet.  The benefits of regular aerobic exercise were discussed. She exercises 4 times per week ,  60 minutes.   Depression screen: there are no signs or vegative symptoms of depression- irritability, change in appetite, anhedonia, sadness/tearfullness.  Cognitive assessment: the patient manages all their financial and personal affairs and is actively engaged. They could relate day,date,year and events; recalled 2/3 objects at 3 minutes; performed clock-face test normally.  The following portions of the patient's history were reviewed and updated as appropriate: allergies, current medications,  past family history, past medical history,  past surgical history, past social history  and problem list.  Visual acuity was not assessed per patient preference since she has regular follow up with her ophthalmologist. Hearing and body mass index were assessed and reviewed.   During the course of the visit the patient was educated and counseled about appropriate screening and preventive services including : fall prevention , diabetes screening, nutrition counseling, colorectal cancer screening, and recommended immunizations.    CC: The primary encounter diagnosis was Screening for osteoporosis. Diagnoses of Screening for cervical cancer, Encounter for preventive health examination, Postmenopausal atrophic vaginitis, Acute non-recurrent frontal sinusitis, and Vitamin D deficiency were also pertinent to this visit.  Recurrent migraine headaches.  Seen one month ago after ER visit.  CT head normal,  ESR and CRP were normal .   Has not resumed any medications since then .   Sinus pressure bilateral maxillary 3-4 days.  Nasal drainage is dark, and she is flying tomorrow for a week of vacation.   Painful sex due to dryness,  Feels like "bottom is dropping out" as well.    History Melody Hansen has a past medical history of supraventricular tachycardia, Hyperlipidemia, Osteoporosis, and PSVT (paroxysmal supraventricular tachycardia) ([place]).   She has a past surgical history that includes Tonsillectomy; Appendectomy; Ovarian cyst removal; removal calcification right sternoclavicular joint; Breast enhancement surgery; Cardiac electrophysiology study and ablation (March 2015); and Augmentation mammaplasty (Bilateral).   Her family history includes COPD in her father and mother; Heart attack in her mother; Heart disease (age of onset: 56) in  her father; Hyperlipidemia in her father and unknown relative; Stroke in her father.She reports that  has never smoked. she has never used smokeless tobacco. She reports that  she drinks about 0.6 oz of alcohol per week. She reports that she does not use drugs.  Outpatient Medications Prior to Visit  Medication Sig Dispense Refill  . Calcium Carb-Cholecalciferol (CALTRATE 600+D3 SOFT PO) Take 1 capsule by mouth daily.    . Multiple Vitamin (MULTIVITAMIN) capsule Take 1 capsule by mouth daily.    Marland Kitchen buPROPion (WELLBUTRIN XL) 150 MG 24 hr tablet Take 1 tablet (150 mg total) by mouth daily. (Patient not taking: Reported on 07/24/2017) 30 tablet 0  . naratriptan (AMERGE) 2.5 MG tablet Take 1 tablet (2.5 mg total) by mouth as needed for migraine. Take one (1) tablet at onset of headache; if returns or does not resolve, may repeat after 4 hours; do not exceed five (5) mg in 24 hours. (Patient not taking: Reported on 07/24/2017) 10 tablet 0   No facility-administered medications prior to visit.     Review of Systems   Patient denies headache, fevers, malaise, unintentional weight loss, skin rash, eye pain, sinus congestion and sinus pain, sore throat, dysphagia,  hemoptysis , cough, dyspnea, wheezing, chest pain, palpitations, orthopnea, edema, abdominal pain, nausea, melena, diarrhea, constipation, flank pain, dysuria, hematuria, urinary  Frequency, nocturia, numbness, tingling, seizures,  Focal weakness, Loss of consciousness,  Tremor, insomnia, depression, anxiety, and suicidal ideation.      Objective:  BP 100/70 (BP Location: Left Arm, Patient Position: Sitting, Cuff Size: Normal)   Pulse 68   Temp 98.2 F (36.8 C) (Oral)   Resp 14   Ht 5' 4" (1.626 m)   Wt 124 lb 3.2 oz (56.3 kg)   SpO2 96%   BMI 21.32 kg/m   Physical Exam   General Appearance:    Alert, cooperative, no distress, appears stated age  Head:    Normocephalic, without obvious abnormality, atraumatic  Eyes:    PERRL, conjunctiva/corneas clear, EOM's intact, fundi    benign, both eyes  Ears:    Normal TM's and external ear canals, both ears  Nose:   Nares normal, septum midline, mucosa normal, no  drainage    or sinus tenderness  Throat:   Lips, mucosa, and tongue normal; teeth and gums normal  Neck:   Supple, symmetrical, trachea midline, no adenopathy;    thyroid:  no enlargement/tenderness/nodules; no carotid   bruit or JVD  Back:     Symmetric, no curvature, ROM normal, no CVA tenderness  Lungs:     Clear to auscultation bilaterally, respirations unlabored  Chest Wall:    No tenderness or deformity   Heart:    Regular rate and rhythm, S1 and S2 normal, no murmur, rub   or gallop  Breast Exam:    No tenderness, masses, or nipple abnormality  Abdomen:     Soft, non-tender, bowel sounds active all four quadrants,    no masses, no organomegaly  Genitalia:    Pelvic: cervix normal in appearance, external genitalia normal, no adnexal masses or tenderness, no cervical motion tenderness, rectovaginal septum normal, uterus normal size, shape, and consistency and vagina with signs of atrophy  Extremities:   Extremities normal, atraumatic, no cyanosis or edema  Pulses:   2+ and symmetric all extremities  Skin:   Skin color, texture, turgor normal, no rashes or lesions  Lymph nodes:   Cervical, supraclavicular, and axillary nodes normal  Neurologic:   CNII-XII  intact, normal strength, sensation and reflexes    throughout      Assessment & Plan:   Problem List Items Addressed This Visit    Acute frontal sinusitis    Given chronicity of symptoms, development of facial pain and exam consistent with bacterial URI,  Will treat with empiric antibiotics, decongestants, steroid taper, and saline lavage. Use of probiotics advised       Relevant Medications   predniSONE (DELTASONE) 10 MG tablet   levofloxacin (LEVAQUIN) 500 MG tablet   Encounter for preventive health examination    Annual comprehensive preventive exam was done as well as an evaluation and management of chronic conditions .  During the course of the visit the patient was educated and counseled about appropriate screening and  preventive services including :  diabetes screening, lipid analysis with projected  10 year  risk for CAD , nutrition counseling, breast, cervical and colorectal cancer screening, and recommended immunizations.  Printed recommendations for health maintenance screenings was given      Postmenopausal atrophic vaginitis    Vaginal estrogen cream prescribed       Vitamin D deficiency    Drisdol weekly x 12 weeks        Other Visit Diagnoses    Screening for osteoporosis    -  Primary   Relevant Orders   DG Bone Density   Screening for cervical cancer       Relevant Orders   Cytology - PAP (Completed)      I have discontinued Melody Hansen buPROPion. I am also having her start on predniSONE, levofloxacin, ergocalciferol, and conjugated estrogens. Additionally, I am having her maintain her multivitamin, Calcium Carb-Cholecalciferol (CALTRATE 600+D3 SOFT PO), and naratriptan.  Meds ordered this encounter  Medications  . predniSONE (DELTASONE) 10 MG tablet    Sig: 6 tablets on Day 1 , then reduce by 1 tablet daily until gone    Dispense:  21 tablet    Refill:  0  . levofloxacin (LEVAQUIN) 500 MG tablet    Sig: Take 1 tablet (500 mg total) by mouth daily.    Dispense:  7 tablet    Refill:  0  . ergocalciferol (DRISDOL) 50000 units capsule    Sig: Take 1 capsule (50,000 Units total) by mouth once a week.    Dispense:  4 capsule    Refill:  3  . conjugated estrogens (PREMARIN) vaginal cream    Sig: Place 1 Applicatorful vaginally daily. For two weeks ,  Then twice weekly thereafter    Dispense:  42.5 g    Refill:  12  . naratriptan (AMERGE) 2.5 MG tablet    Sig: Take 1 tablet (2.5 mg total) by mouth as needed for migraine. Take one (1) tablet at onset of headache; if returns or does not resolve, may repeat after 4 hours; do not exceed five (5) mg in 24 hours.    Dispense:  10 tablet    Refill:  0    Medications Discontinued During This Encounter  Medication Reason  .  naratriptan (AMERGE) 2.5 MG tablet Reorder  . buPROPion (WELLBUTRIN XL) 150 MG 24 hr tablet     Follow-up: No Follow-up on file.   Crecencio Mc, MD

## 2017-08-22 LAB — CYTOLOGY - PAP
DIAGNOSIS: NEGATIVE
HPV: NOT DETECTED

## 2017-08-23 DIAGNOSIS — J0110 Acute frontal sinusitis, unspecified: Secondary | ICD-10-CM | POA: Insufficient documentation

## 2017-08-23 DIAGNOSIS — N952 Postmenopausal atrophic vaginitis: Secondary | ICD-10-CM | POA: Insufficient documentation

## 2017-08-23 HISTORY — DX: Postmenopausal atrophic vaginitis: N95.2

## 2017-08-23 NOTE — Assessment & Plan Note (Signed)
Vaginal estrogen cream prescribed

## 2017-08-23 NOTE — Assessment & Plan Note (Signed)
Drisdol weekly x 12 weeks

## 2017-08-23 NOTE — Assessment & Plan Note (Signed)
Given chronicity of symptoms, development of facial pain and exam consistent with bacterial URI,  Will treat with empiric antibiotics, decongestants, steroid taper, and saline lavage. Use of probiotics advised

## 2017-08-23 NOTE — Assessment & Plan Note (Signed)
Annual comprehensive preventive exam was done as well as an evaluation and management of chronic conditions .  During the course of the visit the patient was educated and counseled about appropriate screening and preventive services including :  diabetes screening, lipid analysis with projected  10 year  risk for CAD , nutrition counseling, breast, cervical and colorectal cancer screening, and recommended immunizations.  Printed recommendations for health maintenance screenings was given 

## 2017-10-02 ENCOUNTER — Other Ambulatory Visit: Payer: Self-pay | Admitting: Internal Medicine

## 2017-12-26 DIAGNOSIS — M5416 Radiculopathy, lumbar region: Secondary | ICD-10-CM | POA: Diagnosis not present

## 2017-12-26 DIAGNOSIS — M533 Sacrococcygeal disorders, not elsewhere classified: Secondary | ICD-10-CM | POA: Diagnosis not present

## 2018-03-09 ENCOUNTER — Telehealth: Payer: Self-pay | Admitting: Internal Medicine

## 2018-03-09 MED ORDER — NARATRIPTAN HCL 2.5 MG PO TABS: 2.5000 mg | ORAL_TABLET | ORAL | 0 refills | Status: DC | PRN

## 2018-03-09 NOTE — Telephone Encounter (Signed)
Copied from [place] [id number]. Topic: Quick Communication - Rx Refill/Question >> Mar 09, 2018 11:35 AM Selinda Flavin B, NT wrote: **Will be going out of town on Wednesday 03/11/18 and in and out of town sporadically for the next 2 weeks.**  Medication: naratriptan (AMERGE) 2.5 MG tablet   Has the patient contacted their pharmacy? Yes.   (Agent: If no, request that the patient contact the pharmacy for the refill.) (Agent: If yes, when and what did the pharmacy advise?)  Preferred Pharmacy (with phone number or street name): [id number] [address], [place]: Please be advised that RX refills may take up to 3 business days. We ask that you follow-up with your pharmacy.

## 2018-09-23 ENCOUNTER — Other Ambulatory Visit: Payer: Self-pay | Admitting: Internal Medicine

## 2018-09-23 NOTE — Telephone Encounter (Signed)
Requested medication (s) are due for refill today: Yes  Requested medication (s) are on the active medication list: Yes  Last refill:  03/09/18  Future visit scheduled: No  Notes to clinic:  See request    Requested Prescriptions  Pending Prescriptions Disp Refills   naratriptan (AMERGE) 2.5 MG tablet [Pharmacy Med Name: NARATRIPTAN HCL 2.5 MG TABLET] 10 tablet 0    Sig: TAKE 1 TABLET BY MOUTH DAILY AS NEEDED FOR MIGRAINE. MAY REPEAT ONCE IN 4 HOURS IF NEEDED. MAX 2/DAY     Neurology:  Migraine Therapy - Triptan Failed - 09/23/2018  2:23 PM      Failed - Valid encounter within last 12 months    Recent Outpatient Visits          1 year ago Encounter for preventive health examination   [place] Crecencio Mc, MD   1 year ago Screening for cervical cancer   [place] Crecencio Mc, MD   1 year ago Weight loss, unintentional   [place] Crecencio Mc, MD   2 years ago Closed fracture of multiple ribs of right side with delayed healing, subsequent encounter   [place] [facility] Crecencio Mc, MD   2 years ago Encounter for preventive health examination   [facility] Crecencio Mc, MD             Passed - Last BP in normal range    BP Readings from Last 1 Encounters:  08/20/17 100/70

## 2018-09-23 NOTE — Telephone Encounter (Signed)
Last OV 08/20/2017   Last refilled 03/09/2018 disp 10 with no refills   Next OV none scheduled   Sent to PCP for approval

## 2018-09-29 ENCOUNTER — Ambulatory Visit (INDEPENDENT_AMBULATORY_CARE_PROVIDER_SITE_OTHER): Payer: Self-pay | Admitting: Family Medicine

## 2018-09-29 ENCOUNTER — Encounter: Payer: Self-pay | Admitting: Family Medicine

## 2018-09-29 ENCOUNTER — Other Ambulatory Visit: Payer: Self-pay

## 2018-09-29 DIAGNOSIS — L02214 Cutaneous abscess of groin: Secondary | ICD-10-CM

## 2018-09-29 MED ORDER — SULFAMETHOXAZOLE-TRIMETHOPRIM 800-160 MG PO TABS: 1.0000 | ORAL_TABLET | Freq: Two times a day (BID) | ORAL | 0 refills | Status: DC

## 2018-09-29 NOTE — Progress Notes (Signed)
Patient ID: Melody Hansen, female   DOB: 1955/09/16, 63 y.o.   MRN: [id number]  Virtual Visit via video Note  This visit type was conducted due to national recommendations for restrictions regarding the COVID-19 pandemic (e.g. social distancing).  This format is felt to be most appropriate for this patient at this time.  All issues noted in this document were discussed and addressed.  No physical exam was performed (except for noted visual exam findings with Video Visits).   I connected with Melody Hansen on 09/29/18 at 11:00 AM EDT by a video enabled telemedicine application and verified that I am speaking with the correct person using two identifiers. Location patient: home (beach house in [place]) Location provider: [place] participating in the virtual visit: patient, provider  I discussed the limitations, risks, security and privacy concerns of performing an evaluation and management service by video and the availability of in person appointments. I also discussed with the patient that there may be a patient responsible charge related to this service. The patient expressed understanding and agreed to proceed.   HPI:  Patient and I connected via video today to discuss suspected swollen lymph node in groin area.  Patient states she noticed it about 4 to 5 days ago, and the area seems to becoming bigger and more tender.  States she pushed on the area yesterday due to noticing a whitehead, and it began to bleed slightly and drained a little bit of white pus. Patient does not shave hair in the groin area.   ROS:   Constitutional: Negative for chills, fatigue and fever.  HENT: Negative for congestion, ear pain, sinus pain and sore throat.   Eyes: Negative.   Respiratory: Negative for cough, shortness of breath and wheezing.   Cardiovascular: Negative for chest pain, palpitations and leg swelling.  Gastrointestinal: Negative for abdominal pain, diarrhea, nausea and  vomiting.  Genitourinary: Negative for dysuria, frequency and urgency.  Musculoskeletal: Negative for arthralgias and myalgias.  Skin: +tender area in right groin Neurological: Negative for syncope, light-headedness and headaches.  Psychiatric/Behavioral: The patient is not nervous/anxious.     Past Medical History:  Diagnosis Date  . Hx of supraventricular tachycardia   . Hyperlipidemia    familial  . Osteoporosis   . PSVT (paroxysmal supraventricular tachycardia) ([place])     Past Surgical History:  Procedure Laterality Date  . APPENDECTOMY    . AUGMENTATION MAMMAPLASTY Bilateral    25 years ago  . BREAST ENHANCEMENT SURGERY    . CARDIAC ELECTROPHYSIOLOGY STUDY AND ABLATION  March 2015   Dorothea Ogle, [facility] for SVT   . OVARIAN CYST REMOVAL    . removal calcification right sternoclavicular joint    . TONSILLECTOMY      Family History  Problem Relation Age of Onset  . Stroke Father   . Heart disease Father 59  . Hyperlipidemia Father   . COPD Father   . Heart attack Mother   . COPD Mother   . Hyperlipidemia Unknown        family history  . Breast cancer Neg Hx     Social History   Tobacco Use  . Smoking status: Never Smoker  . Smokeless tobacco: Never Used  Substance Use Topics  . Alcohol use: Yes    Alcohol/week: 1.0 standard drinks    Types: 1 Glasses of wine per week    Current Outpatient Medications:  .  buPROPion (WELLBUTRIN XL) 150 MG 24 hr tablet, TAKE 1 TABLET BY MOUTH  EVERY DAY, Disp: 90 tablet, Rfl: 1 .  Calcium Carb-Cholecalciferol (CALTRATE 600+D3 SOFT PO), Take 1 capsule by mouth daily., Disp: , Rfl:  .  conjugated estrogens (PREMARIN) vaginal cream, Place 1 Applicatorful vaginally daily. For two weeks ,  Then twice weekly thereafter, Disp: 42.5 g, Rfl: 12 .  ergocalciferol (DRISDOL) 50000 units capsule, Take 1 capsule (50,000 Units total) by mouth once a week., Disp: 4 capsule, Rfl: 3 .  levofloxacin (LEVAQUIN) 500 MG tablet, Take 1 tablet (500 mg  total) by mouth daily., Disp: 7 tablet, Rfl: 0 .  Multiple Vitamin (MULTIVITAMIN) capsule, Take 1 capsule by mouth daily., Disp: , Rfl:  .  naratriptan (AMERGE) 2.5 MG tablet, TAKE 1 TABLET BY MOUTH DAILY AS NEEDED FOR MIGRAINE. MAY REPEAT ONCE IN 4 HOURS IF NEEDED. MAX 2/DAY, Disp: 10 tablet, Rfl: 0 .  predniSONE (DELTASONE) 10 MG tablet, 6 tablets on Day 1 , then reduce by 1 tablet daily until gone, Disp: 21 tablet, Rfl: 0  EXAM:  GENERAL: alert, oriented, appears well and in no acute distress  HEENT: atraumatic, conjunttiva clear, no obvious abnormalities on inspection of external nose and ears  NECK: normal movements of the head and neck  LUNGS: on inspection no signs of respiratory distress, breathing rate appears normal, no obvious gross SOB, gasping or wheezing  SKIN: Red,raised area in right groin with small white head, possibly an infected ingrown hair.   CV: no obvious cyanosis  MS: moves all visible extremities without noticeable abnormality  PSYCH/NEURO: pleasant and cooperative, no obvious depression or anxiety, speech and thought processing grossly intact  ASSESSMENT AND PLAN:  Discussed the following assessment and plan:  Abscess, groin - Plan: sulfamethoxazole-trimethoprim (BACTRIM DS,SEPTRA DS) 800-160 MG tablet   After description of area and groin, and being able to see the area via video I do not suspect it is a swollen lymph node.  I more so believe that the area is not infected ingrown hair/abscess in the groin.  Patient will take Bactrim twice daily for 10 days to cover skin infection. Area most likely will continue to drain on its own since it already has done so.  Advised to apply warm compresses to area.  Advised to keep skin as clean and dry as possible; cleanse with mild soap & pat dry.  Advised to monitor area for increasing redness, increasing size, increasing drainage and increasing pain.  Also advised to monitor self for development of fever.  Advised  patient that if area does not improve or recurs, she may have to come into clinic for incision and drainage and or see a surgeon for complete surgical removal.   I discussed the assessment and treatment plan with the patient. The patient was provided an opportunity to ask questions and all were answered. The patient agreed with the plan and demonstrated an understanding of the instructions.   The patient was advised to call back or seek an in-person evaluation if the symptoms worsen or if the condition fails to improve as anticipated.    Jodelle Green, FNP

## 2018-10-16 ENCOUNTER — Other Ambulatory Visit: Payer: Self-pay | Admitting: Internal Medicine

## 2018-10-16 NOTE — Telephone Encounter (Signed)
Last OV 09/29/2018  Last refilled  10 tablet 0 09/23/2018     Next OV none scheduled   Sent to PCP for approval

## 2019-02-17 DIAGNOSIS — W57XXXA Bitten or stung by nonvenomous insect and other nonvenomous arthropods, initial encounter: Secondary | ICD-10-CM | POA: Diagnosis not present

## 2019-02-17 DIAGNOSIS — S80869A Insect bite (nonvenomous), unspecified lower leg, initial encounter: Secondary | ICD-10-CM | POA: Diagnosis not present

## 2019-02-17 DIAGNOSIS — L2389 Allergic contact dermatitis due to other agents: Secondary | ICD-10-CM | POA: Diagnosis not present

## 2019-02-17 DIAGNOSIS — S40869A Insect bite (nonvenomous) of unspecified upper arm, initial encounter: Secondary | ICD-10-CM | POA: Diagnosis not present

## 2019-02-25 DIAGNOSIS — H40013 Open angle with borderline findings, low risk, bilateral: Secondary | ICD-10-CM | POA: Diagnosis not present

## 2019-02-25 DIAGNOSIS — H43812 Vitreous degeneration, left eye: Secondary | ICD-10-CM | POA: Diagnosis not present

## 2019-02-25 DIAGNOSIS — H02831 Dermatochalasis of right upper eyelid: Secondary | ICD-10-CM | POA: Diagnosis not present

## 2019-02-25 DIAGNOSIS — H524 Presbyopia: Secondary | ICD-10-CM | POA: Diagnosis not present

## 2019-04-09 DIAGNOSIS — M1812 Unilateral primary osteoarthritis of first carpometacarpal joint, left hand: Secondary | ICD-10-CM | POA: Diagnosis not present

## 2019-06-22 ENCOUNTER — Other Ambulatory Visit: Payer: Self-pay

## 2019-06-23 ENCOUNTER — Other Ambulatory Visit: Payer: Self-pay

## 2019-06-23 ENCOUNTER — Ambulatory Visit (INDEPENDENT_AMBULATORY_CARE_PROVIDER_SITE_OTHER): Payer: Medicare Other | Admitting: Internal Medicine

## 2019-06-23 ENCOUNTER — Other Ambulatory Visit (HOSPITAL_COMMUNITY)
Admission: RE | Admit: 2019-06-23 | Discharge: 2019-06-23 | Disposition: A | Discharge status: Home / Self Care | Payer: Medicare Other | Source: Ambulatory Visit | Attending: Internal Medicine | Admitting: Internal Medicine

## 2019-06-23 ENCOUNTER — Encounter: Payer: Self-pay | Admitting: Internal Medicine

## 2019-06-23 VITALS — BP 90/68 | HR 76 | Temp 96.7°F | Resp 14 | Ht 64.0 in | Wt 132.8 lb

## 2019-06-23 DIAGNOSIS — E785 Hyperlipidemia, unspecified: Secondary | ICD-10-CM

## 2019-06-23 DIAGNOSIS — N362 Urethral caruncle: Secondary | ICD-10-CM

## 2019-06-23 DIAGNOSIS — N950 Postmenopausal bleeding: Secondary | ICD-10-CM | POA: Insufficient documentation

## 2019-06-23 DIAGNOSIS — Z1151 Encounter for screening for human papillomavirus (HPV): Secondary | ICD-10-CM | POA: Diagnosis not present

## 2019-06-23 DIAGNOSIS — Z780 Asymptomatic menopausal state: Secondary | ICD-10-CM | POA: Diagnosis not present

## 2019-06-23 HISTORY — DX: Postmenopausal bleeding: N95.0

## 2019-06-23 HISTORY — DX: Urethral caruncle: N36.2

## 2019-06-23 NOTE — Progress Notes (Signed)
Subjective:  Patient ID: Melody Hansen, female    DOB: June 03, 1956  Age: 64 y.o. MRN: [id number]  CC: The primary encounter diagnosis was Postmenopausal bleeding. Diagnoses of Hyperlipidemia LDL goal <160, PMB (postmenopausal bleeding), and Urethral polyp were also pertinent to this visit. HPI Melody Hansen presents for evaluation of vaginal bleeding  This visit occurred during the SARS-CoV-2 public health emergency.  Safety protocols were in place, including screening questions prior to the visit, additional usage of staff PPE, and extensive cleaning of exam room while observing appropriate contact time as indicated for disinfecting solutions.   Vaginal bleeding started 3 days ago,  No labial irritation  Or pain ,  No recent intercourse   Clitoris felt inflamed and tender 3 weeks ago and was riding a stationery bike before Christmas,  Not since.  Denies vaginal odor,  No itching   But Some thin scant discharge, not sure if coming from bladder or vagina.  No pelvic pain, flank pain, nausea or weight loss.  Feels some weight gain due to covid confinement         Outpatient Medications Prior to Visit  Medication Sig Dispense Refill  . Calcium Carb-Cholecalciferol (CALTRATE 600+D3 SOFT PO) Take 1 capsule by mouth daily.    Marland Kitchen conjugated estrogens (PREMARIN) vaginal cream Place 1 Applicatorful vaginally daily. For two weeks ,  Then twice weekly thereafter (Patient not taking: Reported on 06/23/2019) 42.5 g 12  . ergocalciferol (DRISDOL) 50000 units capsule Take 1 capsule (50,000 Units total) by mouth once a week. (Patient not taking: Reported on 06/23/2019) 4 capsule 3  . levofloxacin (LEVAQUIN) 500 MG tablet Take 1 tablet (500 mg total) by mouth daily. (Patient not taking: Reported on 06/23/2019) 7 tablet 0  . Multiple Vitamin (MULTIVITAMIN) capsule Take 1 capsule by mouth daily.    . naratriptan (AMERGE) 2.5 MG tablet TAKE 1 TABLET BY MOUTH DAILY AS NEEDED FOR MIGRAINE. MAY REPEAT ONCE IN 4  HOURS IF NEEDED. MAX 2/DAY (Patient not taking: Reported on 06/23/2019) 10 tablet 5  . predniSONE (DELTASONE) 10 MG tablet 6 tablets on Day 1 , then reduce by 1 tablet daily until gone (Patient not taking: Reported on 06/23/2019) 21 tablet 0  . sulfamethoxazole-trimethoprim (BACTRIM DS,SEPTRA DS) 800-160 MG tablet Take 1 tablet by mouth 2 (two) times daily. (Patient not taking: Reported on 06/23/2019) 20 tablet 0  . buPROPion (WELLBUTRIN XL) 150 MG 24 hr tablet TAKE 1 TABLET BY MOUTH EVERY DAY (Patient not taking: Reported on 06/23/2019) 90 tablet 1   No facility-administered medications prior to visit.    Review of Systems;  Patient denies headache, fevers, malaise, unintentional weight loss, skin rash, eye pain, sinus congestion and sinus pain, sore throat, dysphagia,  hemoptysis , cough, dyspnea, wheezing, chest pain, palpitations, orthopnea, edema, abdominal pain, nausea, melena, diarrhea, constipation, flank pain, dysuria, hematuria, urinary  Frequency, nocturia, numbness, tingling, seizures,  Focal weakness, Loss of consciousness,  Tremor, insomnia, depression, anxiety, and suicidal ideation.      Objective:  BP 90/68 (BP Location: Left Arm, Patient Position: Sitting, Cuff Size: Normal)   Pulse 76   Temp (!) 96.7 F (35.9 C) (Temporal)   Resp 14   Ht 5\' 4"  (1.626 m)   Wt 132 lb 12.8 oz (60.2 kg)   SpO2 94%   BMI 22.80 kg/m   BP Readings from Last 3 Encounters:  06/23/19 90/68  08/20/17 100/70  07/24/17 104/66    Wt Readings from Last 3 Encounters:  06/23/19 132 lb 12.8  oz (60.2 kg)  08/20/17 124 lb 3.2 oz (56.3 kg)  07/24/17 124 lb 3.2 oz (56.3 kg)    General appearance: alert, cooperative and appears stated age Ears: normal TM's and external ear canals both ears Throat: lips, mucosa, and tongue normal; teeth and gums normal Neck: no adenopathy, no carotid bruit, supple, symmetrical, trachea midline and thyroid not enlarged, symmetric, no tenderness/mass/nodules Back:  symmetric, no curvature. ROM normal. No CVA tenderness. Lungs: clear to auscultation bilaterally Heart: regular rate and rhythm, S1, S2 normal, no murmur, click, rub or gallop Abdomen: soft, non-tender; bowel sounds normal; no masses,  no organomegaly GYN: normal external genitalia for age.  urethral polyp with recent bleeding. . Vaginal vault pink,  Scant amount of fresh blood , source appears to be  from cervical os ,  No discharge .   Pulses: 2+ and symmetric Skin: Skin color, texture, turgor normal. No rashes or lesions Lymph nodes: Cervical, supraclavicular, and axillary nodes normal.  Lab Results  Component Value Date   HGBA1C 5.5 05/22/2016   HGBA1C 5.7 08/08/2014    Lab Results  Component Value Date   CREATININE 0.53 08/19/2017   CREATININE 0.67 07/24/2017   CREATININE 0.63 11/20/2016    Lab Results  Component Value Date   WBC 5.7 07/24/2017   HGB 12.7 07/24/2017   HCT 37.6 07/24/2017   PLT 228.0 07/24/2017   GLUCOSE 94 08/19/2017   CHOL 207 (H) 08/19/2017   TRIG 48.0 08/19/2017   HDL 75.50 08/19/2017   LDLDIRECT 139.0 05/22/2016   LDLCALC 122 (H) 08/19/2017   ALT 12 08/19/2017   AST 17 08/19/2017   NA 139 08/19/2017   K 4.4 08/19/2017   CL 103 08/19/2017   CREATININE 0.53 08/19/2017   BUN 15 08/19/2017   CO2 32 08/19/2017   TSH 0.49 07/24/2017   INR 0.9 09/30/2011   HGBA1C 5.5 05/22/2016    No results found.  Assessment & Plan:   Problem List Items Addressed This Visit      Unprioritized   PMB (postmenopausal bleeding)    PAP SMEAR DONE. Coags,  CBC,  UA , thyroid ordered.   Gyn referral for T/v ultrasound and biopsy if indicated      Urethral polyp    Also noted on pelvic exam.  Some recent bleeding noted. Will refer to gyn for further evaluation        Other Visit Diagnoses    Postmenopausal bleeding    -  Primary   Relevant Orders   Cytology - PAP( [facility])   Comprehensive metabolic panel   CBC with Differential/Platelet   TSH    Ambulatory referral to Gynecology   Protime-INR   Hyperlipidemia LDL goal <160       Relevant Orders   LDL cholesterol, direct   Lipid panel      I have discontinued Samauria Franson's buPROPion. I am also having her maintain her multivitamin, Calcium Carb-Cholecalciferol (CALTRATE 600+D3 SOFT PO), predniSONE, levofloxacin, ergocalciferol, conjugated estrogens, sulfamethoxazole-trimethoprim, and naratriptan.  No orders of the defined types were placed in this encounter.   Medications Discontinued During This Encounter  Medication Reason  . buPROPion (WELLBUTRIN XL) 150 MG 24 hr tablet Patient has not taken in last 30 days    Follow-up: No follow-ups on file.   Crecencio Mc, MD

## 2019-06-23 NOTE — Assessment & Plan Note (Signed)
Also noted on pelvic exam.  Some recent bleeding noted. Will refer to gyn for further evaluation

## 2019-06-23 NOTE — Assessment & Plan Note (Addendum)
PAP SMEAR DONE. Coags,  CBC,  UA , thyroid ordered.   Gyn referral for T/v ultrasound and biopsy if indicated

## 2019-06-23 NOTE — Patient Instructions (Addendum)
Let's start with referral to [facility] for the ultrasound   The urinalysis and other tests will be resulted in a few dasy

## 2019-06-24 ENCOUNTER — Ambulatory Visit: Payer: Self-pay | Admitting: Internal Medicine

## 2019-06-24 LAB — CBC WITH DIFFERENTIAL/PLATELET
Basophils Absolute: 0.1 10*3/uL (ref 0.0–0.1)
Basophils Relative: 1 % (ref 0.0–3.0)
Eosinophils Absolute: 0.1 10*3/uL (ref 0.0–0.7)
Eosinophils Relative: 2 % (ref 0.0–5.0)
HCT: 38.7 % (ref 36.0–46.0)
Hemoglobin: 12.8 g/dL (ref 12.0–15.0)
Lymphocytes Relative: 25.4 % (ref 12.0–46.0)
Lymphs Abs: 1.7 10*3/uL (ref 0.7–4.0)
MCHC: 33 g/dL (ref 30.0–36.0)
MCV: 85.2 fl (ref 78.0–100.0)
Monocytes Absolute: 0.5 10*3/uL (ref 0.1–1.0)
Monocytes Relative: 7.5 % (ref 3.0–12.0)
Neutro Abs: 4.3 10*3/uL (ref 1.4–7.7)
Neutrophils Relative %: 64.1 % (ref 43.0–77.0)
Platelets: 214 10*3/uL (ref 150.0–400.0)
RBC: 4.54 Mil/uL (ref 3.87–5.11)
RDW: 14.2 % (ref 11.5–15.5)
WBC: 6.7 10*3/uL (ref 4.0–10.5)

## 2019-06-24 LAB — LIPID PANEL
Cholesterol: 264 mg/dL — ABNORMAL HIGH (ref 0–200)
HDL: 79.2 mg/dL (ref >39.00)
LDL Cholesterol: 165 mg/dL — ABNORMAL HIGH (ref 0–99)
NonHDL: 184.65
Total CHOL/HDL Ratio: 3
Triglycerides: 100 mg/dL (ref 0.0–149.0)
VLDL: 20 mg/dL (ref 0.0–40.0)

## 2019-06-24 LAB — COMPREHENSIVE METABOLIC PANEL
ALT: 15 U/L (ref 0–35)
AST: 19 U/L (ref 0–37)
Albumin: 4.5 g/dL (ref 3.5–5.2)
Alkaline Phosphatase: 47 U/L (ref 39–117)
BUN: 23 mg/dL (ref 6–23)
CO2: 31 mEq/L (ref 19–32)
Calcium: 10 mg/dL (ref 8.4–10.5)
Chloride: 102 mEq/L (ref 96–112)
Creatinine, Ser: 0.83 mg/dL (ref 0.40–1.20)
GFR: 68.8 mL/min (ref >60.00)
Glucose, Bld: 96 mg/dL (ref 70–99)
Potassium: 4.3 mEq/L (ref 3.5–5.1)
Sodium: 139 mEq/L (ref 135–145)
Total Bilirubin: 0.5 mg/dL (ref 0.2–1.2)
Total Protein: 6.6 g/dL (ref 6.0–8.3)

## 2019-06-24 LAB — PROTIME-INR
INR: 0.9
Prothrombin Time: 10 s (ref 9.0–11.5)

## 2019-06-24 LAB — TSH: TSH: 0.5 u[IU]/mL (ref 0.35–4.50)

## 2019-06-24 LAB — LDL CHOLESTEROL, DIRECT: Direct LDL: 159 mg/dL

## 2019-06-28 LAB — CYTOLOGY - PAP
Chlamydia: NEGATIVE
Comment: NEGATIVE
Comment: NEGATIVE
Comment: NEGATIVE
Comment: NEGATIVE
Comment: NORMAL
Diagnosis: NEGATIVE
HSV1: NEGATIVE
HSV2: NEGATIVE
High risk HPV: NEGATIVE
Neisseria Gonorrhea: NEGATIVE
Trichomonas: NEGATIVE

## 2019-06-30 DIAGNOSIS — N950 Postmenopausal bleeding: Secondary | ICD-10-CM | POA: Diagnosis not present

## 2019-06-30 DIAGNOSIS — N362 Urethral caruncle: Secondary | ICD-10-CM | POA: Diagnosis not present

## 2019-07-01 ENCOUNTER — Telehealth: Payer: Self-pay | Admitting: Internal Medicine

## 2019-07-01 NOTE — Telephone Encounter (Signed)
Pt wanted to let PCP know of possible covid exposure by dentist's office on 06/28/19. I let her know to wait a few more days to get tested. Pt has no symptoms at this time.

## 2019-07-01 NOTE — Telephone Encounter (Signed)
FYI

## 2019-07-03 ENCOUNTER — Other Ambulatory Visit: Payer: Self-pay | Admitting: Internal Medicine

## 2019-07-03 DIAGNOSIS — R310 Gross hematuria: Secondary | ICD-10-CM

## 2019-07-05 ENCOUNTER — Ambulatory Visit: Payer: Medicare Other | Attending: Internal Medicine

## 2019-07-05 DIAGNOSIS — Z20822 Contact with and (suspected) exposure to covid-19: Secondary | ICD-10-CM

## 2019-07-06 LAB — NOVEL CORONAVIRUS, NAA: SARS-CoV-2, NAA: NOT DETECTED

## 2019-07-07 ENCOUNTER — Telehealth: Payer: Self-pay | Admitting: *Deleted

## 2019-07-07 NOTE — Telephone Encounter (Signed)
Patient called given negative covid results . 

## 2019-08-02 ENCOUNTER — Ambulatory Visit: Payer: Self-pay

## 2019-08-03 ENCOUNTER — Other Ambulatory Visit: Payer: Self-pay

## 2019-08-03 ENCOUNTER — Ambulatory Visit (INDEPENDENT_AMBULATORY_CARE_PROVIDER_SITE_OTHER): Payer: Medicare Other

## 2019-08-03 VITALS — Ht 64.0 in | Wt 132.0 lb

## 2019-08-03 DIAGNOSIS — Z1231 Encounter for screening mammogram for malignant neoplasm of breast: Secondary | ICD-10-CM

## 2019-08-03 DIAGNOSIS — Z0000 Encounter for general adult medical examination without abnormal findings: Secondary | ICD-10-CM | POA: Diagnosis not present

## 2019-08-03 DIAGNOSIS — Z1159 Encounter for screening for other viral diseases: Secondary | ICD-10-CM | POA: Diagnosis not present

## 2019-08-03 DIAGNOSIS — Z1211 Encounter for screening for malignant neoplasm of colon: Secondary | ICD-10-CM

## 2019-08-03 DIAGNOSIS — Z780 Asymptomatic menopausal state: Secondary | ICD-10-CM

## 2019-08-03 NOTE — Patient Instructions (Addendum)
Bone Density Test The bone density test uses a special type of X-ray to measure the amount of calcium and other minerals in your bones. It can measure bone density in the hip and the spine. The test procedure is similar to having a regular X-ray. This test may also be called:  Bone densitometry.  Bone mineral density test.  Dual-energy X-ray absorptiometry (DEXA). You may have this test to:  Diagnose a condition that causes weak or thin bones (osteoporosis).  Screen you for osteoporosis.  Predict your risk for a broken bone (fracture).  Determine how well your osteoporosis treatment is working. Tell a health care provider about:  Any allergies you have.  All medicines you are taking, including vitamins, herbs, eye drops, creams, and over-the-counter medicines.  Any problems you or family members have had with anesthetic medicines.  Any blood disorders you have.  Any surgeries you have had.  Any medical conditions you have.  Whether you are pregnant or may be pregnant.  Any medical tests you have had within the past 14 days that used contrast material. What are the risks? Generally, this is a safe procedure. However, it does expose you to a small amount of radiation, which can slightly increase your cancer risk. What happens before the procedure?  Do not take any calcium supplements starting 24 hours before your test.  Remove all metal jewelry, eyeglasses, dental appliances, and any other metal objects. What happens during the procedure?   You will lie down on an exam table. There will be an X-ray generator below you and an imaging device above you.  Other devices, such as boxes or braces, may be used to position your body properly for the scan.  The machine will slowly scan your body. You will need to keep still.  The images will show up on a screen in the room. Images will be examined by a specialist after your test is done. The procedure may vary among health  care providers and hospitals. What happens after the procedure?  It is up to you to get your test results. Ask your health care provider, or the department that is doing the test, when your results will be ready. Summary  A bone density test is an imaging test that uses a type of X-ray to measure the amount of calcium and other minerals in your bones.  The test may be used to diagnose or screen you for a condition that causes weak or thin bones (osteoporosis), predict your risk for a broken bone (fracture), or determine how well your osteoporosis treatment is working.  Do not take any calcium supplements starting 24 hours before your test.  Ask your health care provider, or the department that is doing the test, when your results will be ready. This information is not intended to replace advice given to you by your health care provider. Make sure you discuss any questions you have with your health care provider. Document Revised: 06/19/2017 Document Reviewed: 04/07/2017 Elsevier Patient Education  [place] A mammogram is a low energy X-ray of the breasts that is done to check for abnormal changes. This procedure can screen for and detect any changes that may indicate breast cancer. Mammograms are regularly done on women. A man may have a mammogram if he has a lump or swelling in his breast. A mammogram can also identify other changes and variations in the breast, such as:  Inflammation of the breast tissue (mastitis).  An infected  area that contains a collection of pus (abscess).  A fluid-filled sac (cyst).  Fibrocystic changes. This is when breast tissue becomes denser, which can make the tissue feel rope-like or uneven under the skin.  Tumors that are not cancerous (benign). Tell a health care provider:  About any allergies you have.  If you have breast implants.  If you have had previous breast disease, biopsy, or surgery.  If you are breastfeeding.   If you are younger than age 66.  If you have a family history of breast cancer.  Whether you are pregnant or may be pregnant. What are the risks? Generally, this is a safe procedure. However, problems may occur, including:  Exposure to radiation. Radiation levels are very low with this test.  The results being misinterpreted.  The need for further tests.  The inability of the mammogram to detect certain cancers. What happens before the procedure?  Schedule your test about 1-2 weeks after your menstrual period if you are still menstruating. This is usually when your breasts are the least tender.  If you have had a mammogram done at a different facility in the past, get the mammogram X-rays or have them sent to your current exam facility. The new and old images will be compared.  Wash your breasts and underarms on the day of the test.  Do not wear deodorants, perfumes, lotions, or powders anywhere on your body on the day of the test.  Remove any jewelry from your neck.  Wear clothes that you can change into and out of easily. What happens during the procedure?   You will undress from the waist up and put on a gown that opens in the front.  You will stand in front of the X-ray machine.  Each breast will be placed between two plastic or glass plates. The plates will compress your breast for a few seconds. Try to stay as relaxed as possible during the procedure. This does not cause any harm to your breasts and any discomfort you feel will be very brief.  X-rays will be taken from different angles of each breast. The procedure may vary among health care providers and hospitals. What happens after the procedure?  The mammogram will be examined by a specialist (radiologist).  You may need to repeat certain parts of the test, depending on the quality of the images. This is commonly done if the radiologist needs a better view of the breast tissue.  You may resume your normal  activities.  It is up to you to get the results of your procedure. Ask your health care provider, or the department that is doing the procedure, when your results will be ready. Summary  A mammogram is a low energy X-ray of the breasts that is done to check for abnormal changes. A man may have a mammogram if he has a lump or swelling in his breast.  If you have had a mammogram done at a different facility in the past, get the mammogram X-rays or have them sent to your current exam facility in order to compare them.  Schedule your test about 1-2 weeks after your menstrual period if you are still menstruating.  For this test, each breast will be placed between two plastic or glass plates. The plates will compress your breast for a few seconds.  Ask when your test results will be ready. Make sure you get your test results. This information is not intended to replace advice given to you by your  health care provider. Make sure you discuss any questions you have with your health care provider. Document Revised: 01/22/2018 Document Reviewed: 01/22/2018 Elsevier Patient Education  2020 [place].   Bone Density Test The bone density test uses a special type of X-ray to measure the amount of calcium and other minerals in your bones. It can measure bone density in the hip and the spine. The test procedure is similar to having a regular X-ray. This test may also be called:  Bone densitometry.  Bone mineral density test.  Dual-energy X-ray absorptiometry (DEXA). You may have this test to:  Diagnose a condition that causes weak or thin bones (osteoporosis).  Screen you for osteoporosis.  Predict your risk for a broken bone (fracture).  Determine how well your osteoporosis treatment is working. Tell a health care provider about:  Any allergies you have.  All medicines you are taking, including vitamins, herbs, eye drops, creams, and over-the-counter medicines.  Any problems you or  family members have had with anesthetic medicines.  Any blood disorders you have.  Any surgeries you have had.  Any medical conditions you have.  Whether you are pregnant or may be pregnant.  Any medical tests you have had within the past 14 days that used contrast material. What are the risks? Generally, this is a safe procedure. However, it does expose you to a small amount of radiation, which can slightly increase your cancer risk. What happens before the procedure?  Do not take any calcium supplements starting 24 hours before your test.  Remove all metal jewelry, eyeglasses, dental appliances, and any other metal objects. What happens during the procedure?   You will lie down on an exam table. There will be an X-ray generator below you and an imaging device above you.  Other devices, such as boxes or braces, may be used to position your body properly for the scan.  The machine will slowly scan your body. You will need to keep still.  The images will show up on a screen in the room. Images will be examined by a specialist after your test is done. The procedure may vary among health care providers and hospitals. What happens after the procedure?  It is up to you to get your test results. Ask your health care provider, or the department that is doing the test, when your results will be ready. Summary  A bone density test is an imaging test that uses a type of X-ray to measure the amount of calcium and other minerals in your bones.  The test may be used to diagnose or screen you for a condition that causes weak or thin bones (osteoporosis), predict your risk for a broken bone (fracture), or determine how well your osteoporosis treatment is working.  Do not take any calcium supplements starting 24 hours before your test.  Ask your health care provider, or the department that is doing the test, when your results will be ready. This information is not intended to replace advice  given to you by your health care provider. Make sure you discuss any questions you have with your health care provider. Document Revised: 06/19/2017 Document Reviewed: 04/07/2017 Elsevier Patient Education  [place] A mammogram is a low energy X-ray of the breasts that is done to check for abnormal changes. This procedure can screen for and detect any changes that may indicate breast cancer. Mammograms are regularly done on women. A man may have a mammogram if he has  a lump or swelling in his breast. A mammogram can also identify other changes and variations in the breast, such as:  Inflammation of the breast tissue (mastitis).  An infected area that contains a collection of pus (abscess).  A fluid-filled sac (cyst).  Fibrocystic changes. This is when breast tissue becomes denser, which can make the tissue feel rope-like or uneven under the skin.  Tumors that are not cancerous (benign). Tell a health care provider:  About any allergies you have.  If you have breast implants.  If you have had previous breast disease, biopsy, or surgery.  If you are breastfeeding.  If you are younger than age 38.  If you have a family history of breast cancer.  Whether you are pregnant or may be pregnant. What are the risks? Generally, this is a safe procedure. However, problems may occur, including:  Exposure to radiation. Radiation levels are very low with this test.  The results being misinterpreted.  The need for further tests.  The inability of the mammogram to detect certain cancers. What happens before the procedure?  Schedule your test about 1-2 weeks after your menstrual period if you are still menstruating. This is usually when your breasts are the least tender.  If you have had a mammogram done at a different facility in the past, get the mammogram X-rays or have them sent to your current exam facility. The new and old images will be compared.  Wash your  breasts and underarms on the day of the test.  Do not wear deodorants, perfumes, lotions, or powders anywhere on your body on the day of the test.  Remove any jewelry from your neck.  Wear clothes that you can change into and out of easily. What happens during the procedure?   You will undress from the waist up and put on a gown that opens in the front.  You will stand in front of the X-ray machine.  Each breast will be placed between two plastic or glass plates. The plates will compress your breast for a few seconds. Try to stay as relaxed as possible during the procedure. This does not cause any harm to your breasts and any discomfort you feel will be very brief.  X-rays will be taken from different angles of each breast. The procedure may vary among health care providers and hospitals. What happens after the procedure?  The mammogram will be examined by a specialist (radiologist).  You may need to repeat certain parts of the test, depending on the quality of the images. This is commonly done if the radiologist needs a better view of the breast tissue.  You may resume your normal activities.  It is up to you to get the results of your procedure. Ask your health care provider, or the department that is doing the procedure, when your results will be ready. Summary  A mammogram is a low energy X-ray of the breasts that is done to check for abnormal changes. A man may have a mammogram if he has a lump or swelling in his breast.  If you have had a mammogram done at a different facility in the past, get the mammogram X-rays or have them sent to your current exam facility in order to compare them.  Schedule your test about 1-2 weeks after your menstrual period if you are still menstruating.  For this test, each breast will be placed between two plastic or glass plates. The plates will compress your breast for a few  seconds.  Ask when your test results will be ready. Make sure you get  your test results. This information is not intended to replace advice given to you by your health care provider. Make sure you discuss any questions you have with your health care provider. Document Revised: 01/22/2018 Document Reviewed: 01/22/2018 Elsevier Patient Education  [place].

## 2019-08-03 NOTE — Progress Notes (Addendum)
Subjective:   Melody Hansen is a 64 y.o. female who presents for an Initial Medicare Annual Wellness Visit.  Review of Systems    No ROS.  Medicare Wellness Virtual Visit.  Visual/audio telehealth visit, UTA vital signs.   Ht/Wt provided.  See social history for additional risk factors.  Cardiac Risk Factors include: advanced age (>39men, >31 women)     Objective:    Today's Vitals   08/03/19 1312  Weight: 132 lb (59.9 kg)  Height: 5\' 4"  (1.626 m)   Body mass index is 22.66 kg/m.  Advanced Directives 08/03/2019 07/17/2017 04/16/2016  Does Patient Have a Medical Advance Directive? No No No  Would patient like information on creating a medical advance directive? No - Patient declined No - Patient declined -    Current Medications (verified) Outpatient Encounter Medications as of 08/03/2019  Medication Sig  . Calcium Carb-Cholecalciferol (CALTRATE 600+D3 SOFT PO) Take 1 capsule by mouth daily.  . Multiple Vitamin (MULTIVITAMIN) capsule Take 1 capsule by mouth daily.  Marland Kitchen conjugated estrogens (PREMARIN) vaginal cream Place 1 Applicatorful vaginally daily. For two weeks ,  Then twice weekly thereafter (Patient not taking: Reported on 06/23/2019)  . ergocalciferol (DRISDOL) 50000 units capsule Take 1 capsule (50,000 Units total) by mouth once a week. (Patient not taking: Reported on 06/23/2019)  . naratriptan (AMERGE) 2.5 MG tablet TAKE 1 TABLET BY MOUTH DAILY AS NEEDED FOR MIGRAINE. MAY REPEAT ONCE IN 4 HOURS IF NEEDED. MAX 2/DAY (Patient not taking: Reported on 06/23/2019)  . [DISCONTINUED] levofloxacin (LEVAQUIN) 500 MG tablet Take 1 tablet (500 mg total) by mouth daily. (Patient not taking: Reported on 06/23/2019)  . [DISCONTINUED] predniSONE (DELTASONE) 10 MG tablet 6 tablets on Day 1 , then reduce by 1 tablet daily until gone (Patient not taking: Reported on 06/23/2019)  . [DISCONTINUED] sulfamethoxazole-trimethoprim (BACTRIM DS,SEPTRA DS) 800-160 MG tablet Take 1 tablet by mouth 2  (two) times daily. (Patient not taking: Reported on 06/23/2019)   No facility-administered encounter medications on file as of 08/03/2019.    Allergies (verified) Penicillins   History: Past Medical History:  Diagnosis Date  . Hx of supraventricular tachycardia   . Hyperlipidemia    familial  . Osteoporosis   . PSVT (paroxysmal supraventricular tachycardia) ([place])    Past Surgical History:  Procedure Laterality Date  . APPENDECTOMY    . AUGMENTATION MAMMAPLASTY Bilateral    25 years ago  . BREAST ENHANCEMENT SURGERY    . CARDIAC ELECTROPHYSIOLOGY STUDY AND ABLATION  March 2015   Dorothea Ogle, [facility] for SVT   . OVARIAN CYST REMOVAL    . removal calcification right sternoclavicular joint    . TONSILLECTOMY     Family History  Problem Relation Age of Onset  . Stroke Father   . Heart disease Father 46  . Hyperlipidemia Father   . COPD Father   . Heart attack Mother   . COPD Mother   . Hyperlipidemia Other        family history  . Breast cancer Neg Hx    Social History   Socioeconomic History  . Marital status: Married    Spouse name: Not on file  . Number of children: Not on file  . Years of education: Not on file  . Highest education level: Not on file  Occupational History  . Not on file  Tobacco Use  . Smoking status: Never Smoker  . Smokeless tobacco: Never Used  Substance and Sexual Activity  . Alcohol use: Yes  Alcohol/week: 1.0 standard drinks    Types: 1 Glasses of wine per week  . Drug use: No  . Sexual activity: Not on file  Other Topics Concern  . Not on file  Social History Narrative  . Not on file   Social Determinants of Health   Financial Resource Strain:   . Difficulty of Paying Living Expenses: Not on file  Food Insecurity:   . Worried About Charity fundraiser in the Last Year: Not on file  . Ran Out of Food in the Last Year: Not on file  Transportation Needs:   . Lack of Transportation (Medical): Not on file  . Lack of  Transportation (Non-Medical): Not on file  Physical Activity:   . Days of Exercise per Week: Not on file  . Minutes of Exercise per Session: Not on file  Stress:   . Feeling of Stress : Not on file  Social Connections:   . Frequency of Communication with Friends and Family: Not on file  . Frequency of Social Gatherings with Friends and Family: Not on file  . Attends Religious Services: Not on file  . Active Member of Clubs or Organizations: Not on file  . Attends Archivist Meetings: Not on file  . Marital Status: Not on file    Tobacco Counseling Counseling given: Not Answered   Clinical Intake:  Pre-visit preparation completed: Yes        Diabetes: No  How often do you need to have someone help you when you read instructions, pamphlets, or other written materials from your doctor or pharmacy?: 1 - Never  Interpreter Needed?: No      Activities of Daily Living In your present state of health, do you have any difficulty performing the following activities: 08/03/2019  Hearing? N  Vision? N  Difficulty concentrating or making decisions? N  Walking or climbing stairs? N  Dressing or bathing? N  Doing errands, shopping? N  Preparing Food and eating ? N  Using the Toilet? N  In the past six months, have you accidently leaked urine? Y  Comment Mananged with daily liner  Do you have problems with loss of bowel control? N  Managing your Medications? N  Managing your Finances? N  Housekeeping or managing your Housekeeping? N  Some recent data might be hidden     Immunizations and Health Maintenance Immunization History  Administered Date(s) Administered  . Influenza, High Dose Seasonal PF 04/09/2019  . Influenza-Unspecified 03/11/2014, 03/08/2015, 02/21/2016, 03/22/2016, 03/31/2017  . Tdap 03/11/2014   Health Maintenance Due  Topic Date Due  . Hepatitis C Screening  1955/06/25  . DEXA SCAN  07/29/2018  . PNA vac Low Risk Adult (1 of 2 - PCV13)  07/29/2018  . Fecal DNA (Cologuard)  06/22/2019  . MAMMOGRAM  06/25/2019    Patient Care Team: Crecencio Mc, MD as PCP - General (Internal Medicine)  Indicate any recent Medical Services you may have received from other than [facility] providers in the past year (date may be approximate).     Assessment:   This is a routine wellness examination for Melody Hansen.  Nurse connected with patient 08/03/19 at  1:00 PM EST by a telephone enabled telemedicine application and verified that I am speaking with the correct person using two identifiers. Patient stated full name and DOB. Patient gave permission to continue with virtual visit. Patient's location was at home and Nurse's location was at [place] office.   Patient is alert and oriented x3.  Patient denies difficulty focusing or concentrating. 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   Health Maintenance Due: -PNA vaccine- discussed; to be completed with doctor in visit or local pharmacy.  -Dexa Scan- ordered.  -Mammogram- ordered. -Cologuard- ordered.  See completed HM at the end of note.   Eye: Visual acuity not assessed. Virtual visit. Followed by their ophthalmologist.  Dental: Visits every 6 months.   Hearing: Demonstrates normal hearing during visit.  Safety:  Patient feels safe at home- yes Patient does have smoke detectors at home- yes Patient does wear sunscreen or protective clothing when in direct sunlight - yes Patient does wear seat belt when in a moving vehicle - yes Patient drives- yes Adequate lighting in walkways free from debris- yes Grab bars and handrails used as appropriate- yes Ambulates with an assistive device- no Cell phone on person when ambulating outside of the home- yes  Social: Alcohol intake - yes      Smoking history- never   Smokers in home? none Illicit drug use? none  Medication: Taking as directed and without issues.  Self managed - yes   Covid-19: Precautions  and sickness symptoms discussed. Wears mask, social distancing, hand hygiene as appropriate.   Activities of Daily Living Patient denies needing assistance with: household chores, feeding themselves, getting from bed to chair, getting to the toilet, bathing/showering, dressing, managing money, or preparing meals.   Discussed the importance of a healthy diet, water intake and the benefits of aerobic exercise.   Physical activity- stationary bike, walking  Diet:  Optavia Water: good intake  Other Providers Patient Care Team: Crecencio Mc, MD as PCP - General (Internal Medicine)  Hearing/Vision screen  Hearing Screening   125Hz  250Hz  500Hz  1000Hz  2000Hz  3000Hz  4000Hz  6000Hz  8000Hz   Right ear:           Left ear:           Comments: Patient is able to hear conversational tones without difficulty.  No issues reported.  Vision Screening Comments: Visual acuity not assessed, virtual visit.  They have seen their ophthalmologist    Dietary issues and exercise activities discussed: Current Exercise Habits: Home exercise routine, Type of exercise: walking;calisthenics, Intensity: Mild  Goals      Patient Stated   . Weight (lb) < 132 lb (59.9 kg) (pt-stated)     I want to lose about 8lb by eating a healthy diet and increasing physical activity.       Depression Screen PHQ 2/9 Scores 08/03/2019 07/24/2017 04/08/2016  PHQ - 2 Score 0 0 0  PHQ- 9 Score - 7 -    Fall Risk Fall Risk  08/03/2019 06/23/2019 04/08/2016  Falls in the past year? 0 0 Yes  Comment - - Patient fell in a boat.  Number falls in past yr: - - 1  Injury with Fall? - - Yes  Follow up Falls evaluation completed Falls evaluation completed -   Timed Get Up and Go Performed no, virtual visit  Cognitive Function:     6CIT Screen 08/03/2019  What Year? 0 points  What month? 0 points  What time? 0 points  Count back from 20 0 points  Months in reverse 0 points  Repeat phrase 0 points  Total Score 0     Screening Tests Health Maintenance  Topic Date Due  . Hepatitis C Screening  May 09, 1956  . DEXA SCAN  07/29/2018  . PNA vac Low Risk Adult (1 of 2 - PCV13) 07/29/2018  .  Fecal DNA (Cologuard)  06/22/2019  . MAMMOGRAM  06/25/2019  . TETANUS/TDAP  03/11/2024  . INFLUENZA VACCINE  Completed     Plan:   Keep all routine maintenance appointments.   Schedule Mammogram- number provided. [phone].   Schedule Dexa Scan. They will contact you or reach out to them while scheduling mammogram.   Cologuard- complete upon receiving.  Follow up with pcp as needed.   Brain engagement activities mailed per request.   Medicare Attestation I have personally reviewed: The patient's medical and social history Their use of alcohol, tobacco or illicit drugs Their current medications and supplements The patient's functional ability including ADLs,fall risks, home safety risks, cognitive, and hearing and visual impairment Diet and physical activities Evidence for depression    I have reviewed and discussed with patient certain preventive protocols, quality metrics, and best practice recommendations.      OBrien-Blaney, Leverne Tessler L, LPN   [id number]     I have reviewed the above information and agree with above.   Deborra Medina, MD

## 2019-09-30 DIAGNOSIS — H43812 Vitreous degeneration, left eye: Secondary | ICD-10-CM | POA: Diagnosis not present

## 2019-09-30 DIAGNOSIS — H524 Presbyopia: Secondary | ICD-10-CM | POA: Diagnosis not present

## 2019-09-30 DIAGNOSIS — H04123 Dry eye syndrome of bilateral lacrimal glands: Secondary | ICD-10-CM | POA: Diagnosis not present

## 2019-09-30 DIAGNOSIS — H16143 Punctate keratitis, bilateral: Secondary | ICD-10-CM | POA: Diagnosis not present

## 2019-09-30 DIAGNOSIS — Z961 Presence of intraocular lens: Secondary | ICD-10-CM | POA: Diagnosis not present

## 2019-10-14 ENCOUNTER — Other Ambulatory Visit: Payer: Medicare Other

## 2019-11-12 ENCOUNTER — Telehealth: Payer: Self-pay | Admitting: Internal Medicine

## 2019-11-12 DIAGNOSIS — Z1211 Encounter for screening for malignant neoplasm of colon: Secondary | ICD-10-CM

## 2019-11-12 NOTE — Telephone Encounter (Signed)
Patient confirmed ok to order colloguard ordered via EPIC and on Cologuard portal.

## 2019-11-27 DIAGNOSIS — Z1211 Encounter for screening for malignant neoplasm of colon: Secondary | ICD-10-CM | POA: Diagnosis not present

## 2019-11-27 DIAGNOSIS — Z1212 Encounter for screening for malignant neoplasm of rectum: Secondary | ICD-10-CM | POA: Diagnosis not present

## 2019-11-27 LAB — COLOGUARD: Cologuard: NEGATIVE

## 2019-12-03 LAB — COLOGUARD
COLOGUARD: NEGATIVE
Cologuard: NEGATIVE

## 2019-12-13 ENCOUNTER — Telehealth: Payer: Self-pay | Admitting: Internal Medicine

## 2019-12-13 NOTE — Telephone Encounter (Signed)
MyChart message sent re negative cologuard result

## 2020-01-19 ENCOUNTER — Other Ambulatory Visit: Payer: Medicare Other

## 2020-02-16 DIAGNOSIS — Z1159 Encounter for screening for other viral diseases: Secondary | ICD-10-CM | POA: Diagnosis not present

## 2020-02-16 DIAGNOSIS — R070 Pain in throat: Secondary | ICD-10-CM | POA: Diagnosis not present

## 2020-02-16 DIAGNOSIS — Z20822 Contact with and (suspected) exposure to covid-19: Secondary | ICD-10-CM | POA: Diagnosis not present

## 2020-02-16 DIAGNOSIS — J019 Acute sinusitis, unspecified: Secondary | ICD-10-CM | POA: Diagnosis not present

## 2020-02-23 ENCOUNTER — Other Ambulatory Visit: Payer: Medicare Other

## 2020-03-03 DIAGNOSIS — H04123 Dry eye syndrome of bilateral lacrimal glands: Secondary | ICD-10-CM | POA: Diagnosis not present

## 2020-03-09 DIAGNOSIS — H04123 Dry eye syndrome of bilateral lacrimal glands: Secondary | ICD-10-CM | POA: Diagnosis not present

## 2020-03-14 ENCOUNTER — Other Ambulatory Visit: Payer: Medicare Other

## 2020-04-03 DIAGNOSIS — H04123 Dry eye syndrome of bilateral lacrimal glands: Secondary | ICD-10-CM | POA: Diagnosis not present

## 2020-04-12 ENCOUNTER — Ambulatory Visit
Admission: RE | Admit: 2020-04-12 | Discharge: 2020-04-12 | Disposition: A | Discharge status: Home / Self Care | Payer: Medicare Other | Source: Ambulatory Visit | Attending: Internal Medicine | Admitting: Internal Medicine

## 2020-04-12 ENCOUNTER — Other Ambulatory Visit: Payer: Self-pay

## 2020-04-12 DIAGNOSIS — Z1231 Encounter for screening mammogram for malignant neoplasm of breast: Secondary | ICD-10-CM | POA: Diagnosis not present

## 2020-04-12 DIAGNOSIS — Z8739 Personal history of other diseases of the musculoskeletal system and connective tissue: Secondary | ICD-10-CM | POA: Diagnosis not present

## 2020-04-12 DIAGNOSIS — Z780 Asymptomatic menopausal state: Secondary | ICD-10-CM | POA: Insufficient documentation

## 2020-04-15 ENCOUNTER — Encounter: Payer: Self-pay | Admitting: Internal Medicine

## 2020-04-15 DIAGNOSIS — M810 Age-related osteoporosis without current pathological fracture: Secondary | ICD-10-CM

## 2020-04-15 HISTORY — DX: Age-related osteoporosis without current pathological fracture: M81.0

## 2020-04-15 NOTE — Progress Notes (Signed)
Your Bone Density scores have been received and confirm that you have osteoporosis, at the spine and the hip,  which places you at higher risk for more bone fractures over the next 10 years.    I recommend treating with medication but would need to discuss the pros and cons of starting medications this early, along with  the various choices that are currently available .  For now I would continue to work at getting a minimum  calcium intake of 1200 to 1800 mg daily through diet and supplements ,  And 2000 units of vitamin d daily as well.  weight bearing exercise on a regular basis, has been shown to be helpful as well.  If you would like to discuss pharmacotherapy, please  Make an  appt to discuss  options of treatment .  Regards,  Dr. Derrel Nip

## 2020-05-04 DIAGNOSIS — S8391XA Sprain of unspecified site of right knee, initial encounter: Secondary | ICD-10-CM | POA: Diagnosis not present

## 2020-05-05 DIAGNOSIS — S8391XA Sprain of unspecified site of right knee, initial encounter: Secondary | ICD-10-CM | POA: Diagnosis not present

## 2020-05-10 DIAGNOSIS — M79604 Pain in right leg: Secondary | ICD-10-CM | POA: Diagnosis not present

## 2020-05-17 ENCOUNTER — Encounter: Payer: Self-pay | Admitting: Internal Medicine

## 2020-05-17 ENCOUNTER — Other Ambulatory Visit: Payer: Self-pay

## 2020-05-17 ENCOUNTER — Telehealth (INDEPENDENT_AMBULATORY_CARE_PROVIDER_SITE_OTHER): Payer: Medicare Other | Admitting: Internal Medicine

## 2020-05-17 DIAGNOSIS — F430 Acute stress reaction: Secondary | ICD-10-CM

## 2020-05-17 DIAGNOSIS — M810 Age-related osteoporosis without current pathological fracture: Secondary | ICD-10-CM | POA: Diagnosis not present

## 2020-05-17 DIAGNOSIS — M79604 Pain in right leg: Secondary | ICD-10-CM | POA: Diagnosis not present

## 2020-05-17 DIAGNOSIS — F411 Generalized anxiety disorder: Secondary | ICD-10-CM

## 2020-05-17 DIAGNOSIS — R519 Headache, unspecified: Secondary | ICD-10-CM

## 2020-05-17 MED ORDER — RALOXIFENE HCL 60 MG PO TABS: 60.0000 mg | ORAL_TABLET | Freq: Every day | ORAL | 11 refills | Status: DC

## 2020-05-17 MED ORDER — NARATRIPTAN HCL 2.5 MG PO TABS: ORAL_TABLET | ORAL | 5 refills | Status: DC

## 2020-05-17 MED ORDER — ALPRAZOLAM 0.25 MG PO TABS: 0.2500 mg | ORAL_TABLET | Freq: Every day | ORAL | 0 refills | Status: AC | PRN

## 2020-05-17 NOTE — Assessment & Plan Note (Signed)
She is requesting a medication to use as needed for the holiday aggravation.  The risks and benefits of benzodiazepine use were discussed with patient today including excessive sedation leading to respiratory depression,  impaired thinking/driving, and addiction.  Patient was advised to avoid concurrent use with alcohol, to use medication only as needed and not to share with others  . alprazolam 0.25 mg  #20 no reflls

## 2020-05-17 NOTE — Assessment & Plan Note (Addendum)
Discussed treatment options,  Calcium and Vit d requirements.  Evisat vs Prolia  advised given her previous intolerance to alendronate (persistent headache).  Starting  [employer]

## 2020-05-17 NOTE — Progress Notes (Signed)
Virtual Visit via [place]  This visit type was conducted due to national recommendations for restrictions regarding the COVID-19 pandemic (e.g. social distancing).  This format is felt to be most appropriate for this patient at this time.  All issues noted in this document were discussed and addressed.  No physical exam was performed (except for noted visual exam findings with Video Visits).   I connected with@ on 05/17/20 at  2:30 PM EST by a video enabled telemedicine application  and verified that I am speaking with the correct person using two identifiers. Location patient: home Location provider: work or home office Persons participating in the virtual visit: patient, provider  I discussed the limitations, risks, security and privacy concerns of performing an evaluation and management service by telephone and the availability of in person appointments. I also discussed with the patient that there may be a patient responsible charge related to this service. The patient expressed understanding and agreed to proceed.  Reason for visit: review of DEXA   HPI:  64 yr old female with history of rib fractures in 2018 presents for review of recent DEXA.  Bone Density scores received, she has bone loss which is significant.    30 minutes  As spent with patient reviewing her report, personal history and the risks and benefits of various pharmacologic treatments versus waiting.  I recommended  treating with alendronate weekly dosing , increasing dietary calcium intake to 1800  mg daily, 2/3 through diet and 1/3 through supplements ,  Vitamin D, the amount of which to be dictated by periodic Vit D measurements, and  daily weight bearing exercise .  She anticipates holiday anxiety per usual due to husband's family gatherings and is requesting an as needed medication .  No prior trial of alprazolam.  Denies depressive symptoms.       ROS: See pertinent positives and negatives per HPI.  Past Medical  History:  Diagnosis Date  . Hx of supraventricular tachycardia   . Hyperlipidemia    familial  . Osteoporosis   . PSVT (paroxysmal supraventricular tachycardia) ([place])     Past Surgical History:  Procedure Laterality Date  . APPENDECTOMY    . AUGMENTATION MAMMAPLASTY Bilateral    25 years ago  . BREAST ENHANCEMENT SURGERY    . CARDIAC ELECTROPHYSIOLOGY STUDY AND ABLATION  March 2015   Dorothea Ogle, [facility] for SVT   . OVARIAN CYST REMOVAL    . removal calcification right sternoclavicular joint    . TONSILLECTOMY      Family History  Problem Relation Age of Onset  . Stroke Father   . Heart disease Father 22  . Hyperlipidemia Father   . COPD Father   . Heart attack Mother   . COPD Mother   . Hyperlipidemia Other        family history  . Breast cancer Neg Hx     SOCIAL HX: reports that she has never smoked. She has never used smokeless tobacco. She reports current alcohol use of about 1.0 standard drink of alcohol per week. She reports that she does not use drugs.   Current Outpatient Medications:  .  meloxicam (MOBIC) 15 MG tablet, Take 15 mg by mouth daily., Disp: , Rfl:  .  naratriptan (AMERGE) 2.5 MG tablet, TAKE 1 TABLET BY MOUTH DAILY AS NEEDED FOR MIGRAINE. MAY REPEAT ONCE IN 4 HOURS IF NEEDED. MAX 2/DAY, Disp: 10 tablet, Rfl: 5 .  ALPRAZolam (XANAX) 0.25 MG tablet, Take 1 tablet (0.25 mg total)  by mouth daily as needed for anxiety., Disp: 20 tablet, Rfl: 0 .  raloxifene (EVISTA) 60 MG tablet, Take 1 tablet (60 mg total) by mouth daily., Disp: 30 tablet, Rfl: 11  EXAM:  VITALS per patient if applicable:  GENERAL: alert, oriented, appears well and in no acute distress  HEENT: atraumatic, conjunttiva clear, no obvious abnormalities on inspection of external nose and ears  NECK: normal movements of the head and neck  LUNGS: on inspection no signs of respiratory distress, breathing rate appears normal, no obvious gross SOB, gasping or wheezing  CV: no obvious  cyanosis  MS: moves all visible extremities without noticeable abnormality  PSYCH/NEURO: pleasant and cooperative, no obvious depression or anxiety, speech and thought processing grossly intact  ASSESSMENT AND PLAN:  Discussed the following assessment and plan:  Age-related osteoporosis without current pathological fracture - Plan: Comprehensive metabolic panel, VITAMIN D 25 Hydroxy (Vit-D Deficiency, Fractures), TSH  Anxiety as acute reaction to gross stress  Osteoporosis Discussed treatment options,  Calcium and Vit d requirements.  Evisat vs Prolia  advised given her previous intolerance to alendronate (persistent headache).  Starting  EVISTA   Anxiety as acute reaction to gross stress She is requesting a medication to use as needed for the holiday aggravation.  The risks and benefits of benzodiazepine use were discussed with patient today including excessive sedation leading to respiratory depression,  impaired thinking/driving, and addiction.  Patient was advised to avoid concurrent use with alcohol, to use medication only as needed and not to share with others  . alprazolam 0.25 mg  #20 no reflls     I discussed the assessment and treatment plan with the patient. The patient was provided an opportunity to ask questions and all were answered. The patient agreed with the plan and demonstrated an understanding of the instructions.   The patient was advised to call back or seek an in-person evaluation if the symptoms worsen or if the condition fails to improve as anticipated.  I provided  30 minutes of face-to-face time during this encounter.   Crecencio Mc, MD

## 2020-05-17 NOTE — Patient Instructions (Addendum)
WE decided on starting with Evista to manage your osteoporosis.  Take it once daily at the same time (roughly)  Suspend it if you are planning to have surgery or a long travel trip (one week prior) ,  And resume after the trip (or 1 week after the surgery). This is to reduce the slight risk of blood clot that is due to it mimicking the effects of estrogen on the body.  Try to get 1800 mg calcium daily through diet and exercise,  And take 2000 Ius (or 50 mcg) of Vitamin D3 daily until we can check your level  Continue weight bearing exercise (walking,  Running,  Weight lifting etc)  For the "company:"  Alprazolam 0.25 mg 1/2 to 1 tablet once daily AS NEEDED for anxiety  DO NOT DRINK ALCOHOL WITHIN 2 HOURS OF MEDICATION

## 2020-05-17 NOTE — Assessment & Plan Note (Signed)
Migraine managed with naratriptan.  Refill sent

## 2020-05-18 ENCOUNTER — Other Ambulatory Visit (INDEPENDENT_AMBULATORY_CARE_PROVIDER_SITE_OTHER): Payer: Medicare Other

## 2020-05-18 ENCOUNTER — Other Ambulatory Visit: Payer: Self-pay

## 2020-05-18 DIAGNOSIS — M810 Age-related osteoporosis without current pathological fracture: Secondary | ICD-10-CM

## 2020-05-18 DIAGNOSIS — Z1159 Encounter for screening for other viral diseases: Secondary | ICD-10-CM

## 2020-05-18 LAB — COMPREHENSIVE METABOLIC PANEL
ALT: 15 U/L (ref 0–35)
AST: 20 U/L (ref 0–37)
Albumin: 4.6 g/dL (ref 3.5–5.2)
Alkaline Phosphatase: 45 U/L (ref 39–117)
BUN: 20 mg/dL (ref 6–23)
CO2: 30 mEq/L (ref 19–32)
Calcium: 9.7 mg/dL (ref 8.4–10.5)
Chloride: 101 mEq/L (ref 96–112)
Creatinine, Ser: 0.58 mg/dL (ref 0.40–1.20)
GFR: 94.05 mL/min (ref >60.00)
Glucose, Bld: 108 mg/dL — ABNORMAL HIGH (ref 70–99)
Potassium: 5 mEq/L (ref 3.5–5.1)
Sodium: 137 mEq/L (ref 135–145)
Total Bilirubin: 0.5 mg/dL (ref 0.2–1.2)
Total Protein: 6.5 g/dL (ref 6.0–8.3)

## 2020-05-18 LAB — TSH: TSH: 0.58 u[IU]/mL (ref 0.35–4.50)

## 2020-05-18 LAB — VITAMIN D 25 HYDROXY (VIT D DEFICIENCY, FRACTURES): VITD: 56.23 ng/mL (ref 30.00–100.00)

## 2020-05-19 LAB — HEPATITIS C ANTIBODY
Hepatitis C Ab: NONREACTIVE
SIGNAL TO CUT-OFF: 0 (ref <1.00)

## 2020-06-26 ENCOUNTER — Other Ambulatory Visit: Payer: Self-pay

## 2020-06-26 MED ORDER — NARATRIPTAN HCL 2.5 MG PO TABS
ORAL_TABLET | ORAL | 5 refills | Status: DC
Start: 2020-06-26 — End: 2022-01-01

## 2020-07-13 DIAGNOSIS — Z20822 Contact with and (suspected) exposure to covid-19: Secondary | ICD-10-CM | POA: Diagnosis not present

## 2020-07-17 DIAGNOSIS — H16222 Keratoconjunctivitis sicca, not specified as Sjogren's, left eye: Secondary | ICD-10-CM | POA: Diagnosis not present

## 2020-07-17 DIAGNOSIS — H16143 Punctate keratitis, bilateral: Secondary | ICD-10-CM | POA: Diagnosis not present

## 2020-07-17 DIAGNOSIS — H16142 Punctate keratitis, left eye: Secondary | ICD-10-CM | POA: Diagnosis not present

## 2020-07-17 DIAGNOSIS — H0288A Meibomian gland dysfunction right eye, upper and lower eyelids: Secondary | ICD-10-CM | POA: Diagnosis not present

## 2020-07-17 DIAGNOSIS — H16221 Keratoconjunctivitis sicca, not specified as Sjogren's, right eye: Secondary | ICD-10-CM | POA: Diagnosis not present

## 2020-07-17 DIAGNOSIS — H16223 Keratoconjunctivitis sicca, not specified as Sjogren's, bilateral: Secondary | ICD-10-CM | POA: Diagnosis not present

## 2020-07-17 DIAGNOSIS — H16303 Unspecified interstitial keratitis, bilateral: Secondary | ICD-10-CM | POA: Diagnosis not present

## 2020-07-17 DIAGNOSIS — H0220A Unspecified lagophthalmos right eye, upper and lower eyelids: Secondary | ICD-10-CM | POA: Diagnosis not present

## 2020-07-17 DIAGNOSIS — H0220B Unspecified lagophthalmos left eye, upper and lower eyelids: Secondary | ICD-10-CM | POA: Diagnosis not present

## 2020-07-17 DIAGNOSIS — H1045 Other chronic allergic conjunctivitis: Secondary | ICD-10-CM | POA: Diagnosis not present

## 2020-07-17 DIAGNOSIS — H0288B Meibomian gland dysfunction left eye, upper and lower eyelids: Secondary | ICD-10-CM | POA: Diagnosis not present

## 2020-07-17 DIAGNOSIS — H0100B Unspecified blepharitis left eye, upper and lower eyelids: Secondary | ICD-10-CM | POA: Diagnosis not present

## 2020-07-17 DIAGNOSIS — H0100A Unspecified blepharitis right eye, upper and lower eyelids: Secondary | ICD-10-CM | POA: Diagnosis not present

## 2020-08-03 ENCOUNTER — Ambulatory Visit (INDEPENDENT_AMBULATORY_CARE_PROVIDER_SITE_OTHER): Payer: Medicare Other

## 2020-08-03 VITALS — Ht 63.5 in | Wt 116.0 lb

## 2020-08-03 DIAGNOSIS — Z0000 Encounter for general adult medical examination without abnormal findings: Secondary | ICD-10-CM | POA: Diagnosis not present

## 2020-08-03 NOTE — Progress Notes (Addendum)
Subjective:   Melody Hansen is a 65 y.o. female who presents for Medicare Annual (Subsequent) preventive examination.  Review of Systems    No ROS.  Medicare Wellness Virtual Visit.   Cardiac Risk Factors include: advanced age (>43men, >42 women)     Objective:    Today's Vitals   08/03/20 1304  Weight: 116 lb (52.6 kg)  Height: 5' 3.5" (1.613 m)   Body mass index is 20.23 kg/m.  Advanced Directives 08/03/2020 08/03/2019 07/17/2017 04/16/2016  Does Patient Have a Medical Advance Directive? No No No No  Would patient like information on creating a medical advance directive? Yes (MAU/Ambulatory/Procedural Areas - Information given) No - Patient declined No - Patient declined -    Current Medications (verified) Outpatient Encounter Medications as of 08/03/2020  Medication Sig   ALPRAZolam (XANAX) 0.25 MG tablet Take 1 tablet (0.25 mg total) by mouth daily as needed for anxiety.   LOTEMAX 0.5 % OINT SMARTSIG:0.5 Strip(s) In Eye(s) Every Night   meloxicam (MOBIC) 15 MG tablet Take 15 mg by mouth daily.   naratriptan (AMERGE) 2.5 MG tablet TAKE 1 TABLET BY MOUTH DAILY AS NEEDED FOR MIGRAINE. MAY REPEAT ONCE IN 4 HOURS IF NEEDED. MAX 2/DAY   raloxifene (EVISTA) 60 MG tablet Take 1 tablet (60 mg total) by mouth daily.   No facility-administered encounter medications on file as of 08/03/2020.    Allergies (verified) Penicillins   History: Past Medical History:  Diagnosis Date   Hx of supraventricular tachycardia    Hyperlipidemia    familial   Osteoporosis    PSVT (paroxysmal supraventricular tachycardia) (HCC)    Past Surgical History:  Procedure Laterality Date   APPENDECTOMY     AUGMENTATION MAMMAPLASTY Bilateral    25 years ago   BREAST ENHANCEMENT SURGERY     CARDIAC ELECTROPHYSIOLOGY STUDY AND ABLATION  March 2015   [place], [place] for SVT    OVARIAN CYST REMOVAL     removal calcification right sternoclavicular joint     TONSILLECTOMY     Family History   Problem Relation Age of Onset   Stroke Father    Heart disease Father 70   Hyperlipidemia Father    COPD Father    Heart attack Mother    COPD Mother    Hyperlipidemia Other        family history   Breast cancer Neg Hx    Social History   Socioeconomic History   Marital status: Married    Spouse name: Not on file   Number of children: Not on file   Years of education: Not on file   Highest education level: Not on file  Occupational History   Not on file  Tobacco Use   Smoking status: Never Smoker   Smokeless tobacco: Never Used  Substance and Sexual Activity   Alcohol use: Yes    Alcohol/week: 1.0 standard drink    Types: 1 Glasses of wine per week   Drug use: No   Sexual activity: Not on file  Other Topics Concern   Not on file  Social History Narrative   Not on file   Social Determinants of Health   Financial Resource Strain: Low Risk    Difficulty of Paying Living Expenses: Not hard at all  Food Insecurity: No Food Insecurity   Worried About Charity fundraiser in the Last Year: Never true   Ran Out of Food in the Last Year: Never true  Transportation Needs: No Transportation Needs  Lack of Transportation (Medical): No   Lack of Transportation (Non-Medical): No  Physical Activity: Not on file  Stress: No Stress Concern Present   Feeling of Stress : Not at all  Social Connections: Not on file    Tobacco Counseling Counseling given: Not Answered   Clinical Intake:  Pre-visit preparation completed: Yes        Diabetes: No  How often do you need to have someone help you when you read instructions, pamphlets, or other written materials from your doctor or pharmacy?: 1 - Never    Interpreter Needed?: No      Activities of Daily Living In your present state of health, do you have any difficulty performing the following activities: 08/03/2020  Hearing? N  Vision? N  Difficulty concentrating or making decisions? N  Walking or climbing stairs?  N  Dressing or bathing? N  Doing errands, shopping? N  Preparing Food and eating ? N  Using the Toilet? N  In the past six months, have you accidently leaked urine? N  Comment Managed by liner as needed  Do you have problems with loss of bowel control? N  Managing your Medications? N  Managing your Finances? N  Housekeeping or managing your Housekeeping? N  Some recent data might be hidden    Patient Care Team: Crecencio Mc, MD as PCP - General (Internal Medicine)  Indicate any recent Medical Services you may have received from other than [facility] providers in the past year (date may be approximate).     Assessment:   This is a routine wellness examination for Melody Hansen.  I connected with Ami today by telephone and verified that I am speaking with the correct person using two identifiers. Location patient: home Location provider: work Persons participating in the virtual visit: patient, Marine scientist.    I discussed the limitations, risks, security and privacy concerns of performing an evaluation and management service by telephone and the availability of in person appointments. The patient expressed understanding and verbally consented to this telephonic visit.    Interactive audio and video telecommunications were attempted between this provider and patient, however failed, due to patient having technical difficulties OR patient did not have access to video capability.  We continued and completed visit with audio only.  Some vital signs may be absent or patient reported.   Hearing/Vision screen  Hearing Screening   125Hz  250Hz  500Hz  1000Hz  2000Hz  3000Hz  4000Hz  6000Hz  8000Hz   Right ear:           Left ear:           Comments: Patient is able to hear conversational tones without difficulty.  No issues reported.  Vision Screening Comments: Visual acuity not assessed, virtual visit. They have seen their ophthalmologist. Dry eye specialist, [place]  Dietary issues and  exercise activities discussed: Current Exercise Habits: Home exercise routine, Type of exercise: strength training/weights (Pilates), Intensity: Mild  Healthy diet Good water intake  Goals       Patient Stated     Weight (lb) < 132 lb (59.9 kg) (pt-stated)      I want to lose about 8lb by eating a healthy diet and increasing physical activity.        Depression Screen PHQ 2/9 Scores 08/03/2020 08/03/2019 07/24/2017 04/08/2016  PHQ - 2 Score 0 0 0 0  PHQ- 9 Score - - 7 -    Fall Risk Fall Risk  08/03/2020 05/17/2020 08/03/2019 06/23/2019 04/08/2016  Falls in the past year? 0  0 0 0 Yes  Comment - - - - Patient fell in a boat.  Number falls in past yr: 0 - - - 1  Injury with Fall? 0 - - - Yes  Follow up Falls evaluation completed Falls evaluation completed Falls evaluation completed Falls evaluation completed -    FALL RISK PREVENTION PERTAINING TO THE HOME: Handrails in use when climbing stairs? Yes Home free of loose throw rugs in walkways, pet beds, electrical cords, etc? Yes  Adequate lighting in your home to reduce risk of falls? Yes   ASSISTIVE DEVICES UTILIZED TO PREVENT FALLS: Use of a cane, walker or w/c? No .   TIMED UP AND GO: Was the test performed? No . Virtual visit.   Cognitive Function:  Patient is alert and oriented x3.  Denies difficulty focusing, making decisions, memory loss.  Enjoys audio books, photography and other brain health activities. MMSE/6CIT deferred. Normal by direct communication/observation.    6CIT Screen 08/03/2019  What Year? 0 points  What month? 0 points  What time? 0 points  Count back from 20 0 points  Months in reverse 0 points  Repeat phrase 0 points  Total Score 0    Immunizations Immunization History  Administered Date(s) Administered   Influenza, High Dose Seasonal PF 04/09/2019   Influenza-Unspecified 03/11/2014, 03/08/2015, 02/21/2016, 03/22/2016, 03/31/2017, 03/31/2020   PFIZER(Purple Top)SARS-COV-2 Vaccination  08/27/2019, 09/23/2019, 04/14/2020   Tdap 03/11/2014    Health Maintenance Health Maintenance  Topic Date Due   PNA vac Low Risk Adult (1 of 2 - PCV13) 08/03/2021 (Originally 07/29/2018)   MAMMOGRAM  04/12/2022   Fecal DNA (Cologuard)  12/03/2022   TETANUS/TDAP  03/11/2024   INFLUENZA VACCINE  Completed   DEXA SCAN  Completed   COVID-19 Vaccine  Completed   Hepatitis C Screening  Completed   Colorectal cancer screening: Type of screening: Cologuard. Completed 12/03/19. Repeat every 12/03/19 years  Mammogram status: Completed 04/12/20. Repeat every year   PNA vaccine- deferred per patient. Agrees to update immunization.   Lung Cancer Screening: (Low Dose CT Chest recommended if Age 79-80 years, 30 pack-year currently smoking OR have quit w/in 15years.) does not qualify.    Hepatitis C Screening: Completed 05/18/20.  Vision Screening: Recommended annual ophthalmology exams for early detection of glaucoma and other disorders of the eye. Is the patient up to date with their annual eye exam?  Yes   Dental Screening: Recommended annual dental exams for proper oral hygiene.  Community Resource Referral / Chronic Care Management: CRR required this visit?  No   CCM required this visit?  No      Plan:   Keep all routine maintenance appointments.   I have personally reviewed and noted the following in the patient's chart:   Medical and social history Use of alcohol, tobacco or illicit drugs  Current medications and supplements Functional ability and status Nutritional status Physical activity Advanced directives List of other physicians Hospitalizations, surgeries, and ER visits in previous 12 months Vitals Screenings to include cognitive, depression, and falls Referrals and appointments  In addition, I have reviewed and discussed with patient certain preventive protocols, quality metrics, and best practice recommendations. A written personalized care plan for preventive  services as well as general preventive health recommendations were provided to patient via mychart.     OBrien-Blaney, Amario Longmore L, LPN   [id number]     I have reviewed the above information and agree with above.   Deborra Medina, MD

## 2020-08-03 NOTE — Patient Instructions (Addendum)
Melody Hansen , Thank you for taking time to come for your Medicare Wellness Visit. I appreciate your ongoing commitment to your health goals. Please review the following plan we discussed and let me know if I can assist you in the future.   These are the goals we discussed: Goals      Patient Stated   .  Weight (lb) < 132 lb (59.9 kg) (pt-stated)      I want to lose about 8lb by eating a healthy diet and increasing physical activity.        This is a list of the screening recommended for you and due dates:  Health Maintenance  Topic Date Due  . Pneumonia vaccines (1 of 2 - PCV13) 08/03/2021*  . Mammogram  04/12/2022  . Cologuard (Stool DNA test)  12/03/2022  . Tetanus Vaccine  03/11/2024  . Flu Shot  Completed  . DEXA scan (bone density measurement)  Completed  . COVID-19 Vaccine  Completed  .  Hepatitis C: One time screening is recommended by Center for Disease Control  (CDC) for  adults born from [birth year] through 1967.   Completed  *Topic was postponed. The date shown is not the original due date.    Immunizations Immunization History  Administered Date(s) Administered  . Influenza, High Dose Seasonal PF 04/09/2019  . Influenza-Unspecified 03/11/2014, 03/08/2015, 02/21/2016, 03/22/2016, 03/31/2017, 03/31/2020  . PFIZER(Purple Top)SARS-COV-2 Vaccination 08/27/2019, 09/23/2019, 04/14/2020  . Tdap 03/11/2014   Advanced directives: on file.  Conditions/risks identified: none new.  Follow up in one year for your annual wellness visit.   Preventive Care 46 Years and Older, Female Preventive care refers to lifestyle choices and visits with your health care provider that can promote health and wellness. What does preventive care include?  A yearly physical exam. This is also called an annual well check.  Dental exams once or twice a year.  Routine eye exams. Ask your health care provider how often you should have your eyes checked.  Personal lifestyle choices,  including:  Daily care of your teeth and gums.  Regular physical activity.  Eating a healthy diet.  Avoiding tobacco and drug use.  Limiting alcohol use.  Practicing safe sex.  Taking low-dose aspirin every day.  Taking vitamin and mineral supplements as recommended by your health care provider. What happens during an annual well check? The services and screenings done by your health care provider during your annual well check will depend on your age, overall health, lifestyle risk factors, and family history of disease. Counseling  Your health care provider may ask you questions about your:  Alcohol use.  Tobacco use.  Drug use.  Emotional well-being.  Home and relationship well-being.  Sexual activity.  Eating habits.  History of falls.  Memory and ability to understand (cognition).  Work and work Statistician.  Reproductive health. Screening  You may have the following tests or measurements:  Height, weight, and BMI.  Blood pressure.  Lipid and cholesterol levels. These may be checked every 5 years, or more frequently if you are over 66 years old.  Skin check.  Lung cancer screening. You may have this screening every year starting at age 29 if you have a 30-pack-year history of smoking and currently smoke or have quit within the past 15 years.  Fecal occult blood test (FOBT) of the stool. You may have this test every year starting at age 21.  Flexible sigmoidoscopy or colonoscopy. You may have a sigmoidoscopy every 5 years or a  colonoscopy every 10 years starting at age 60.  Hepatitis C blood test.  Hepatitis B blood test.  Sexually transmitted disease (STD) testing.  Diabetes screening. This is done by checking your blood sugar (glucose) after you have not eaten for a while (fasting). You may have this done every 1-3 years.  Bone density scan. This is done to screen for osteoporosis. You may have this done starting at age 16.  Mammogram. This  may be done every 1-2 years. Talk to your health care provider about how often you should have regular mammograms. Talk with your health care provider about your test results, treatment options, and if necessary, the need for more tests. Vaccines  Your health care provider may recommend certain vaccines, such as:  Influenza vaccine. This is recommended every year.  Tetanus, diphtheria, and acellular pertussis (Tdap, Td) vaccine. You may need a Td booster every 10 years.  Zoster vaccine. You may need this after age 60.  Pneumococcal 13-valent conjugate (PCV13) vaccine. One dose is recommended after age 60.  Pneumococcal polysaccharide (PPSV23) vaccine. One dose is recommended after age 15. Talk to your health care provider about which screenings and vaccines you need and how often you need them. This information is not intended to replace advice given to you by your health care provider. Make sure you discuss any questions you have with your health care provider. Document Released: 06/30/2015 Document Revised: 02/21/2016 Document Reviewed: 04/04/2015 Elsevier Interactive Patient Education  2017 [place] Prevention in the Home Falls can cause injuries. They can happen to people of all ages. There are many things you can do to make your home safe and to help prevent falls. What can I do on the outside of my home?  Regularly fix the edges of walkways and driveways and fix any cracks.  Remove anything that might make you trip as you walk through a door, such as a raised step or threshold.  Trim any bushes or trees on the path to your home.  Use bright outdoor lighting.  Clear any walking paths of anything that might make someone trip, such as rocks or tools.  Regularly check to see if handrails are loose or broken. Make sure that both sides of any steps have handrails.  Any raised decks and porches should have guardrails on the edges.  Have any leaves, snow, or ice cleared  regularly.  Use sand or salt on walking paths during winter.  Clean up any spills in your garage right away. This includes oil or grease spills. What can I do in the bathroom?  Use night lights.  Install grab bars by the toilet and in the tub and shower. Do not use towel bars as grab bars.  Use non-skid mats or decals in the tub or shower.  If you need to sit down in the shower, use a plastic, non-slip stool.  Keep the floor dry. Clean up any water that spills on the floor as soon as it happens.  Remove soap buildup in the tub or shower regularly.  Attach bath mats securely with double-sided non-slip rug tape.  Do not have throw rugs and other things on the floor that can make you trip. What can I do in the bedroom?  Use night lights.  Make sure that you have a light by your bed that is easy to reach.  Do not use any sheets or blankets that are too big for your bed. They should not hang down onto the  floor.  Have a firm chair that has side arms. You can use this for support while you get dressed.  Do not have throw rugs and other things on the floor that can make you trip. What can I do in the kitchen?  Clean up any spills right away.  Avoid walking on wet floors.  Keep items that you use a lot in easy-to-reach places.  If you need to reach something above you, use a strong step stool that has a grab bar.  Keep electrical cords out of the way.  Do not use floor polish or wax that makes floors slippery. If you must use wax, use non-skid floor wax.  Do not have throw rugs and other things on the floor that can make you trip. What can I do with my stairs?  Do not leave any items on the stairs.  Make sure that there are handrails on both sides of the stairs and use them. Fix handrails that are broken or loose. Make sure that handrails are as long as the stairways.  Check any carpeting to make sure that it is firmly attached to the stairs. Fix any carpet that is loose  or worn.  Avoid having throw rugs at the top or bottom of the stairs. If you do have throw rugs, attach them to the floor with carpet tape.  Make sure that you have a light switch at the top of the stairs and the bottom of the stairs. If you do not have them, ask someone to add them for you. What else can I do to help prevent falls?  Wear shoes that:  Do not have high heels.  Have rubber bottoms.  Are comfortable and fit you well.  Are closed at the toe. Do not wear sandals.  If you use a stepladder:  Make sure that it is fully opened. Do not climb a closed stepladder.  Make sure that both sides of the stepladder are locked into place.  Ask someone to hold it for you, if possible.  Clearly mark and make sure that you can see:  Any grab bars or handrails.  First and last steps.  Where the edge of each step is.  Use tools that help you move around (mobility aids) if they are needed. These include:  Canes.  Walkers.  Scooters.  Crutches.  Turn on the lights when you go into a dark area. Replace any light bulbs as soon as they burn out.  Set up your furniture so you have a clear path. Avoid moving your furniture around.  If any of your floors are uneven, fix them.  If there are any pets around you, be aware of where they are.  Review your medicines with your doctor. Some medicines can make you feel dizzy. This can increase your chance of falling. Ask your doctor what other things that you can do to help prevent falls. This information is not intended to replace advice given to you by your health care provider. Make sure you discuss any questions you have with your health care provider. Document Released: 03/30/2009 Document Revised: 11/09/2015 Document Reviewed: 07/08/2014 Elsevier Interactive Patient Education  2017 [employer].

## 2020-10-23 DIAGNOSIS — Z20822 Contact with and (suspected) exposure to covid-19: Secondary | ICD-10-CM | POA: Diagnosis not present

## 2020-10-23 DIAGNOSIS — Z03818 Encounter for observation for suspected exposure to other biological agents ruled out: Secondary | ICD-10-CM | POA: Diagnosis not present

## 2020-10-26 DIAGNOSIS — Z20822 Contact with and (suspected) exposure to covid-19: Secondary | ICD-10-CM | POA: Diagnosis not present

## 2020-10-26 DIAGNOSIS — Z03818 Encounter for observation for suspected exposure to other biological agents ruled out: Secondary | ICD-10-CM | POA: Diagnosis not present

## 2020-12-06 ENCOUNTER — Telehealth: Payer: Self-pay | Admitting: Internal Medicine

## 2020-12-06 ENCOUNTER — Telehealth (INDEPENDENT_AMBULATORY_CARE_PROVIDER_SITE_OTHER): Payer: Medicare Other | Admitting: Internal Medicine

## 2020-12-06 ENCOUNTER — Encounter: Payer: Self-pay | Admitting: Internal Medicine

## 2020-12-06 VITALS — Ht 63.5 in | Wt 118.0 lb

## 2020-12-06 DIAGNOSIS — R4189 Other symptoms and signs involving cognitive functions and awareness: Secondary | ICD-10-CM

## 2020-12-06 DIAGNOSIS — R41840 Attention and concentration deficit: Secondary | ICD-10-CM | POA: Diagnosis not present

## 2020-12-06 NOTE — Telephone Encounter (Signed)
Lft vm for pt to call ofc to sch MRI. thanks

## 2020-12-06 NOTE — Progress Notes (Signed)
Virtual Visit via [employer]  This visit type was conducted due to national recommendations for restrictions regarding the COVID-19 pandemic (e.g. social distancing).  This format is felt to be most appropriate for this patient at this time.  All issues noted in this document were discussed and addressed.  No physical exam was performed (except for noted visual exam findings with Video Visits).   I connected with  NAME@ on 12/06/20 at  8:30 AM EDT by a video enabled telemedicine application  and verified that I am speaking with the correct person using two identifiers. Location patient: home Location provider: work or home office Persons participating in the virtual visit: patient, provider  I discussed the limitations, risks, security and privacy concerns of performing an evaluation and management service by telephone and the availability of in person appointments. I also discussed with the patient that there may be a patient responsible charge related to this service. The patient expressed understanding and agreed to proceed.  Reason for visit: cognitive issues    HPI:  Melody Hansen is a 65 yr old female with a history of concentration deficit first noted in 2016,  managed with wellbutrin until 2019   no formal testing , presents with progressive concentration issues,  limited to word finding problems .  The difficulty has been occurring more frequently     ROS: See pertinent positives and negatives per HPI.  Past Medical History:  Diagnosis Date   Hx of supraventricular tachycardia    Hyperlipidemia    familial   Osteoporosis    PSVT (paroxysmal supraventricular tachycardia) ([place])     Past Surgical History:  Procedure Laterality Date   APPENDECTOMY     AUGMENTATION MAMMAPLASTY Bilateral    25 years ago   BREAST ENHANCEMENT SURGERY     CARDIAC ELECTROPHYSIOLOGY STUDY AND ABLATION  March 2015   [place], [place] for SVT    OVARIAN CYST REMOVAL     removal calcification right  sternoclavicular joint     TONSILLECTOMY      Family History  Problem Relation Age of Onset   Stroke Father    Heart disease Father 60   Hyperlipidemia Father    COPD Father    Heart attack Mother    COPD Mother    Hyperlipidemia Other        family history   Breast cancer Neg Hx     SOCIAL HX:  reports that she has never smoked. She has never used smokeless tobacco. She reports current alcohol use of about 1.0 standard drink of alcohol per week. She reports that she does not use drugs.    Current Outpatient Medications:    ALPRAZolam (XANAX) 0.25 MG tablet, Take 1 tablet (0.25 mg total) by mouth daily as needed for anxiety., Disp: 20 tablet, Rfl: 0   LOTEMAX 0.5 % OINT, SMARTSIG:0.5 Strip(s) In Eye(s) Every Night, Disp: , Rfl:    naratriptan (AMERGE) 2.5 MG tablet, TAKE 1 TABLET BY MOUTH DAILY AS NEEDED FOR MIGRAINE. MAY REPEAT ONCE IN 4 HOURS IF NEEDED. MAX 2/DAY, Disp: 10 tablet, Rfl: 5   raloxifene (EVISTA) 60 MG tablet, Take 1 tablet (60 mg total) by mouth daily., Disp: 30 tablet, Rfl: 11   meloxicam (MOBIC) 15 MG tablet, Take 15 mg by mouth daily. (Patient not taking: Reported on 12/06/2020), Disp: , Rfl:   EXAM:  VITALS per patient if applicable:  GENERAL: alert, oriented, appears well and in no acute distress  HEENT: atraumatic, conjunttiva clear, no obvious abnormalities on  inspection of external nose and ears  NECK: normal movements of the head and neck  LUNGS: on inspection no signs of respiratory distress, breathing rate appears normal, no obvious gross SOB, gasping or wheezing  CV: no obvious cyanosis  MS: moves all visible extremities without noticeable abnormality  PSYCH/NEURO: pleasant and cooperative, no obvious depression or anxiety, speech and thought processing grossly intact Cognitive Testing MMSE - Mini Mental State Exam     Orientation to time  5   Orientation to Place  5   Registration  3   Attention/ Calculation  5   Recall  3   Language-  name 2 objects  2   Language- repeat  1   Language- follow 3 step command  3   Language- read & follow direction  1   Write a sentence  1   Copy design  1   Total score  30               ASSESSMENT AND PLAN:  Discussed the following assessment and plan:  Cognitive changes - Plan: Vitamin B12, RPR, TSH, Comprehensive metabolic panel, HIV antibody (with reflex), MR Brain Wo Contrast, Ambulatory referral to Psychology, Ambulatory referral to Neurology  Concentration deficit  Concentration deficit Her MMSE is normal. MRI Brain ordered.  Referring to neurology and psychology for testing . checking for reversible causes     I discussed the assessment and treatment plan with the patient. The patient was provided an opportunity to ask questions and all were answered. The patient agreed with the plan and demonstrated an understanding of the instructions.   The patient was advised to call back or seek an in-person evaluation if the symptoms worsen or if the condition fails to improve as anticipated.   I spent 30 minutes dedicated to the care of this patient on the date of this encounter to include pre-visit review of his medical history,  Face-to-face time with the patient , and post visit ordering of testing and therapeutics.    Crecencio Mc, MD

## 2020-12-07 ENCOUNTER — Telehealth: Payer: Self-pay | Admitting: Internal Medicine

## 2020-12-07 NOTE — Telephone Encounter (Signed)
Patient returned referrals phone call to schedule MRI

## 2020-12-07 NOTE — Telephone Encounter (Signed)
Documented

## 2020-12-07 NOTE — Telephone Encounter (Signed)
Patient called with information to give to Janett Billow about her covid vaccine. 08/27/19, no lot #, 09/23/2019, lot # [id number], booster on 06/14/20 no lot #. All Pfizer injections

## 2020-12-08 ENCOUNTER — Encounter: Payer: Self-pay | Admitting: Internal Medicine

## 2020-12-08 NOTE — Assessment & Plan Note (Addendum)
Her MMSE is normal. MRI Brain ordered.  Referring to neurology and psychology for testing . checking for reversible causes

## 2020-12-11 ENCOUNTER — Other Ambulatory Visit (INDEPENDENT_AMBULATORY_CARE_PROVIDER_SITE_OTHER): Payer: Medicare Other

## 2020-12-11 ENCOUNTER — Other Ambulatory Visit: Payer: Self-pay

## 2020-12-11 DIAGNOSIS — R4189 Other symptoms and signs involving cognitive functions and awareness: Secondary | ICD-10-CM

## 2020-12-11 LAB — COMPREHENSIVE METABOLIC PANEL
ALT: 16 U/L (ref 0–35)
AST: 20 U/L (ref 0–37)
Albumin: 4.5 g/dL (ref 3.5–5.2)
Alkaline Phosphatase: 47 U/L (ref 39–117)
BUN: 16 mg/dL (ref 6–23)
CO2: 31 mEq/L (ref 19–32)
Calcium: 10.2 mg/dL (ref 8.4–10.5)
Chloride: 100 mEq/L (ref 96–112)
Creatinine, Ser: 0.51 mg/dL (ref 0.40–1.20)
GFR: 96.62 mL/min (ref >60.00)
Glucose, Bld: 88 mg/dL (ref 70–99)
Potassium: 4.3 mEq/L (ref 3.5–5.1)
Sodium: 139 mEq/L (ref 135–145)
Total Bilirubin: 0.8 mg/dL (ref 0.2–1.2)
Total Protein: 6.6 g/dL (ref 6.0–8.3)

## 2020-12-11 LAB — VITAMIN B12: Vitamin B-12: 567 pg/mL (ref 211–911)

## 2020-12-11 LAB — TSH: TSH: 0.68 u[IU]/mL (ref 0.35–4.50)

## 2020-12-12 LAB — HIV ANTIBODY (ROUTINE TESTING W REFLEX): HIV 1&2 Ab, 4th Generation: NONREACTIVE

## 2020-12-12 LAB — RPR: RPR Ser Ql: NONREACTIVE

## 2020-12-20 ENCOUNTER — Ambulatory Visit: Payer: Medicare Other

## 2020-12-22 ENCOUNTER — Encounter: Payer: Self-pay | Admitting: Counselor

## 2020-12-29 ENCOUNTER — Telehealth: Payer: Self-pay | Admitting: Internal Medicine

## 2020-12-29 NOTE — Telephone Encounter (Signed)
Lft vm for pt and ofc to call to follow up on referral from [facility]. thanks

## 2021-01-03 NOTE — Telephone Encounter (Signed)
PT called to return missed phone call

## 2021-01-16 DIAGNOSIS — Z79891 Long term (current) use of opiate analgesic: Secondary | ICD-10-CM | POA: Diagnosis not present

## 2021-01-17 ENCOUNTER — Other Ambulatory Visit: Payer: Self-pay

## 2021-01-17 ENCOUNTER — Ambulatory Visit
Admission: RE | Admit: 2021-01-17 | Discharge: 2021-01-17 | Disposition: A | Discharge status: Home / Self Care | Payer: Medicare Other | Source: Ambulatory Visit | Attending: Internal Medicine | Admitting: Internal Medicine

## 2021-01-17 DIAGNOSIS — R4189 Other symptoms and signs involving cognitive functions and awareness: Secondary | ICD-10-CM | POA: Diagnosis present

## 2021-01-17 DIAGNOSIS — G9389 Other specified disorders of brain: Secondary | ICD-10-CM | POA: Diagnosis not present

## 2021-01-18 ENCOUNTER — Other Ambulatory Visit: Payer: Self-pay | Admitting: Internal Medicine

## 2021-01-18 DIAGNOSIS — E785 Hyperlipidemia, unspecified: Secondary | ICD-10-CM

## 2021-02-05 DIAGNOSIS — Z20822 Contact with and (suspected) exposure to covid-19: Secondary | ICD-10-CM | POA: Diagnosis not present

## 2021-02-05 DIAGNOSIS — Z03818 Encounter for observation for suspected exposure to other biological agents ruled out: Secondary | ICD-10-CM | POA: Diagnosis not present

## 2021-02-05 DIAGNOSIS — J0140 Acute pansinusitis, unspecified: Secondary | ICD-10-CM | POA: Diagnosis not present

## 2021-02-07 DIAGNOSIS — Z7189 Other specified counseling: Secondary | ICD-10-CM | POA: Diagnosis not present

## 2021-02-07 DIAGNOSIS — R059 Cough, unspecified: Secondary | ICD-10-CM | POA: Diagnosis not present

## 2021-02-07 DIAGNOSIS — U071 COVID-19: Secondary | ICD-10-CM | POA: Diagnosis not present

## 2021-02-07 DIAGNOSIS — R509 Fever, unspecified: Secondary | ICD-10-CM | POA: Diagnosis not present

## 2021-03-02 MED ORDER — VALACYCLOVIR HCL 1 G PO TABS: 1000.0000 mg | ORAL_TABLET | Freq: Two times a day (BID) | ORAL | 0 refills | Status: AC

## 2021-03-15 ENCOUNTER — Encounter: Payer: Medicare Other | Admitting: Counselor

## 2021-03-23 ENCOUNTER — Encounter: Payer: Medicare Other | Admitting: Counselor

## 2021-04-30 DIAGNOSIS — S93522A Sprain of metatarsophalangeal joint of left great toe, initial encounter: Secondary | ICD-10-CM | POA: Diagnosis not present

## 2021-06-03 ENCOUNTER — Other Ambulatory Visit: Payer: Self-pay | Admitting: Internal Medicine

## 2021-06-19 ENCOUNTER — Other Ambulatory Visit: Payer: Self-pay | Admitting: Internal Medicine

## 2021-06-19 DIAGNOSIS — Z1231 Encounter for screening mammogram for malignant neoplasm of breast: Secondary | ICD-10-CM

## 2021-06-25 ENCOUNTER — Other Ambulatory Visit: Payer: Self-pay

## 2021-06-25 ENCOUNTER — Ambulatory Visit
Admission: RE | Admit: 2021-06-25 | Discharge: 2021-06-25 | Disposition: A | Discharge status: Home / Self Care | Payer: Medicare Other | Source: Ambulatory Visit | Attending: Internal Medicine | Admitting: Internal Medicine

## 2021-06-25 DIAGNOSIS — Z1231 Encounter for screening mammogram for malignant neoplasm of breast: Secondary | ICD-10-CM | POA: Diagnosis not present

## 2021-06-27 ENCOUNTER — Encounter: Payer: Self-pay | Admitting: Internal Medicine

## 2021-06-27 ENCOUNTER — Ambulatory Visit (INDEPENDENT_AMBULATORY_CARE_PROVIDER_SITE_OTHER): Payer: Medicare Other | Admitting: Psychology

## 2021-06-27 ENCOUNTER — Ambulatory Visit: Payer: Medicare Other | Admitting: Psychology

## 2021-06-27 ENCOUNTER — Encounter: Payer: Self-pay | Admitting: Psychology

## 2021-06-27 ENCOUNTER — Other Ambulatory Visit: Payer: Self-pay

## 2021-06-27 DIAGNOSIS — R41840 Attention and concentration deficit: Secondary | ICD-10-CM

## 2021-06-27 DIAGNOSIS — R4189 Other symptoms and signs involving cognitive functions and awareness: Secondary | ICD-10-CM

## 2021-06-27 HISTORY — DX: Attention and concentration deficit: R41.840

## 2021-06-27 NOTE — Progress Notes (Signed)
° °  Psychometrician Note   Cognitive testing was administered to Melody Hansen by Milana Kidney, B.S. (psychometrist) under the supervision of Dr. Christia Reading, Ph.D., licensed psychologist on 06/27/21. Ms. Nolasco did not appear overtly distressed by the testing session per behavioral observation or responses across self-report questionnaires. Rest breaks were offered.    The battery of tests administered was selected by Dr. Christia Reading, Ph.D. with consideration to Ms. Maura's current level of functioning, the nature of her symptoms, emotional and behavioral responses during interview, level of literacy, observed level of motivation/effort, and the nature of the referral question. This battery was communicated to the psychometrist. Communication between Dr. Christia Reading, Ph.D. and the psychometrist was ongoing throughout the evaluation and Dr. Christia Reading, Ph.D. was immediately accessible at all times. Dr. Christia Reading, Ph.D. provided supervision to the psychometrist on the date of this service to the extent necessary to assure the quality of all services provided.    Earlee Herald will return within approximately 1-2 weeks for an interactive feedback session with Dr. Melvyn Novas at which time her test performances, clinical impressions, and treatment recommendations will be reviewed in detail. Ms. Amodio understands she can contact our office should she require our assistance before this time.  A total of 140 minutes of billable time were spent face-to-face with Ms. Kleinert by the psychometrist. This includes both test administration and scoring time. Billing for these services is reflected in the clinical report generated by Dr. Christia Reading, Ph.D.  This note reflects time spent with the psychometrician and does not include test scores or any clinical interpretations made by Dr. Melvyn Novas. The full report will follow in a separate note.
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
## 2021-06-28 ENCOUNTER — Encounter: Payer: Self-pay | Admitting: Psychology

## 2021-07-10 ENCOUNTER — Ambulatory Visit (INDEPENDENT_AMBULATORY_CARE_PROVIDER_SITE_OTHER): Payer: Medicare Other | Admitting: Psychology

## 2021-07-10 ENCOUNTER — Encounter: Payer: Medicare Other | Admitting: Psychology

## 2021-07-10 ENCOUNTER — Other Ambulatory Visit: Payer: Self-pay

## 2021-07-10 DIAGNOSIS — R41840 Attention and concentration deficit: Secondary | ICD-10-CM

## 2021-07-10 NOTE — Progress Notes (Signed)
° °  Neuropsychology Feedback Session Melody Hansen. [place] Department of Neurology  Reason for Referral:   Melody Hansen is a 66 y.o. right-handed Caucasian female referred by  Deborra Medina, M.D. , to characterize her current cognitive functioning and assist with diagnostic clarity and treatment planning in the context of longstanding deficits with attention and concentration.   Feedback:   Melody Hansen completed a comprehensive neuropsychological evaluation on 06/27/2021. Please refer to that encounter for the full report and recommendations. Briefly, results suggested neuropsychological functioning within normal limits relative to age-matched peers. Performance across all assessed cognitive domains was appropriate. Regarding concerns for ADHD, all cognitive testing scored quite well. Likewise, performance on a continuous performance task did not suggest the presence of an underlying disorder characterized by attentional deficits such as ADHD. With that being said, it is important to realize that the absence of cognitive deficits should not necessarily be interpreted as absence of this condition as there is no pattern of performance across cognitive testing that is specific to ADHD. Individuals with ADHD can perform strongly in testing environments, likely due to the highly structured and distraction free setting in which testing commences. At the present time, while I am not comfortable rendering an official diagnosis of this condition, she certainly has traits of this disorder based upon her past reporting.   Melody Hansen was unaccompanied during the current telephone call. She was within her residence while I was within my office. I discussed the limitations of evaluation and management by telemedicine and the availability of in person appointments. Melody Hansen expressed her understanding and agreed to proceed. Content of the current session focused on the results of her  neuropsychological evaluation. Melody Hansen was given the opportunity to ask questions and her questions were answered. She was encouraged to reach out should additional questions arise. A copy of her report was mailed at the conclusion of the visit.      16 minutes were spent preparing for, conducting, and documenting the current feedback session with Melody Hansen, billed as one unit [phone].

## 2021-08-06 ENCOUNTER — Ambulatory Visit (INDEPENDENT_AMBULATORY_CARE_PROVIDER_SITE_OTHER): Payer: Medicare Other

## 2021-08-06 VITALS — Ht 63.5 in | Wt 118.0 lb

## 2021-08-06 DIAGNOSIS — Z0000 Encounter for general adult medical examination without abnormal findings: Secondary | ICD-10-CM | POA: Diagnosis not present

## 2021-08-06 NOTE — Patient Instructions (Addendum)
Melody Hansen , Thank you for taking time to come for your Medicare Wellness Visit. I appreciate your ongoing commitment to your health goals. Please review the following plan we discussed and let me know if I can assist you in the future.   These are the goals we discussed:  Goals       Patient Stated     I'd like to learn a new language (pt-stated)      Learn to speak [place]        This is a list of the screening recommended for you and due dates:  Health Maintenance  Topic Date Due   COVID-19 Vaccine (4 - Booster for Pfizer series) 08/22/2021*   Zoster (Shingles) Vaccine (1 of 2) 11/03/2021*   Pneumonia Vaccine (1 - PCV) 08/06/2022*   Cologuard (Stool DNA test)  12/03/2022   Mammogram  06/26/2023   Tetanus Vaccine  03/11/2024   Flu Shot  Completed   DEXA scan (bone density measurement)  Completed   Hepatitis C Screening: USPSTF Recommendation to screen - Ages 39-79 yo.  Completed   HPV Vaccine  Aged Out   Colon Cancer Screening  Discontinued  *Topic was postponed. The date shown is not the original due date.    Advanced directives: not yet completed  Conditions/risks identified: none new  Follow up in one year for your annual wellness visit    Preventive Care 65 Years and Older, Female Preventive care refers to lifestyle choices and visits with your health care provider that can promote health and wellness. What does preventive care include? A yearly physical exam. This is also called an annual well check. Dental exams once or twice a year. Routine eye exams. Ask your health care provider how often you should have your eyes checked. Personal lifestyle choices, including: Daily care of your teeth and gums. Regular physical activity. Eating a healthy diet. Avoiding tobacco and drug use. Limiting alcohol use. Practicing safe sex. Taking low-dose aspirin every day. Taking vitamin and mineral supplements as recommended by your health care provider. What happens  during an annual well check? The services and screenings done by your health care provider during your annual well check will depend on your age, overall health, lifestyle risk factors, and family history of disease. Counseling  Your health care provider may ask you questions about your: Alcohol use. Tobacco use. Drug use. Emotional well-being. Home and relationship well-being. Sexual activity. Eating habits. History of falls. Memory and ability to understand (cognition). Work and work Statistician. Reproductive health. Screening  You may have the following tests or measurements: Height, weight, and BMI. Blood pressure. Lipid and cholesterol levels. These may be checked every 5 years, or more frequently if you are over 57 years old. Skin check. Lung cancer screening. You may have this screening every year starting at age 93 if you have a 30-pack-year history of smoking and currently smoke or have quit within the past 15 years. Fecal occult blood test (FOBT) of the stool. You may have this test every year starting at age 68. Flexible sigmoidoscopy or colonoscopy. You may have a sigmoidoscopy every 5 years or a colonoscopy every 10 years starting at age 68. Hepatitis C blood test. Hepatitis B blood test. Sexually transmitted disease (STD) testing. Diabetes screening. This is done by checking your blood sugar (glucose) after you have not eaten for a while (fasting). You may have this done every 1-3 years. Bone density scan. This is done to screen for osteoporosis. You may have  this done starting at age 48. Mammogram. This may be done every 1-2 years. Talk to your health care provider about how often you should have regular mammograms. Talk with your health care provider about your test results, treatment options, and if necessary, the need for more tests. Vaccines  Your health care provider may recommend certain vaccines, such as: Influenza vaccine. This is recommended every  year. Tetanus, diphtheria, and acellular pertussis (Tdap, Td) vaccine. You may need a Td booster every 10 years. Zoster vaccine. You may need this after age 69. Pneumococcal 13-valent conjugate (PCV13) vaccine. One dose is recommended after age 45. Pneumococcal polysaccharide (PPSV23) vaccine. One dose is recommended after age 35. Talk to your health care provider about which screenings and vaccines you need and how often you need them. This information is not intended to replace advice given to you by your health care provider. Make sure you discuss any questions you have with your health care provider. Document Released: 06/30/2015 Document Revised: 02/21/2016 Document Reviewed: 04/04/2015 Elsevier Interactive Patient Education  2017 [place] Prevention in the Home Falls can cause injuries. They can happen to people of all ages. There are many things you can do to make your home safe and to help prevent falls. What can I do on the outside of my home? Regularly fix the edges of walkways and driveways and fix any cracks. Remove anything that might make you trip as you walk through a door, such as a raised step or threshold. Trim any bushes or trees on the path to your home. Use bright outdoor lighting. Clear any walking paths of anything that might make someone trip, such as rocks or tools. Regularly check to see if handrails are loose or broken. Make sure that both sides of any steps have handrails. Any raised decks and porches should have guardrails on the edges. Have any leaves, snow, or ice cleared regularly. Use sand or salt on walking paths during winter. Clean up any spills in your garage right away. This includes oil or grease spills. What can I do in the bathroom? Use night lights. Install grab bars by the toilet and in the tub and shower. Do not use towel bars as grab bars. Use non-skid mats or decals in the tub or shower. If you need to sit down in the shower, use a  plastic, non-slip stool. Keep the floor dry. Clean up any water that spills on the floor as soon as it happens. Remove soap buildup in the tub or shower regularly. Attach bath mats securely with double-sided non-slip rug tape. Do not have throw rugs and other things on the floor that can make you trip. What can I do in the bedroom? Use night lights. Make sure that you have a light by your bed that is easy to reach. Do not use any sheets or blankets that are too big for your bed. They should not hang down onto the floor. Have a firm chair that has side arms. You can use this for support while you get dressed. Do not have throw rugs and other things on the floor that can make you trip. What can I do in the kitchen? Clean up any spills right away. Avoid walking on wet floors. Keep items that you use a lot in easy-to-reach places. If you need to reach something above you, use a strong step stool that has a grab bar. Keep electrical cords out of the way. Do not use floor polish or  wax that makes floors slippery. If you must use wax, use non-skid floor wax. Do not have throw rugs and other things on the floor that can make you trip. What can I do with my stairs? Do not leave any items on the stairs. Make sure that there are handrails on both sides of the stairs and use them. Fix handrails that are broken or loose. Make sure that handrails are as long as the stairways. Check any carpeting to make sure that it is firmly attached to the stairs. Fix any carpet that is loose or worn. Avoid having throw rugs at the top or bottom of the stairs. If you do have throw rugs, attach them to the floor with carpet tape. Make sure that you have a light switch at the top of the stairs and the bottom of the stairs. If you do not have them, ask someone to add them for you. What else can I do to help prevent falls? Wear shoes that: Do not have high heels. Have rubber bottoms. Are comfortable and fit you  well. Are closed at the toe. Do not wear sandals. If you use a stepladder: Make sure that it is fully opened. Do not climb a closed stepladder. Make sure that both sides of the stepladder are locked into place. Ask someone to hold it for you, if possible. Clearly mark and make sure that you can see: Any grab bars or handrails. First and last steps. Where the edge of each step is. Use tools that help you move around (mobility aids) if they are needed. These include: Canes. Walkers. Scooters. Crutches. Turn on the lights when you go into a dark area. Replace any light bulbs as soon as they burn out. Set up your furniture so you have a clear path. Avoid moving your furniture around. If any of your floors are uneven, fix them. If there are any pets around you, be aware of where they are. Review your medicines with your doctor. Some medicines can make you feel dizzy. This can increase your chance of falling. Ask your doctor what other things that you can do to help prevent falls. This information is not intended to replace advice given to you by your health care provider. Make sure you discuss any questions you have with your health care provider. Document Released: 03/30/2009 Document Revised: 11/09/2015 Document Reviewed: 07/08/2014 Elsevier Interactive Patient Education  2017 [employer].

## 2021-08-06 NOTE — Progress Notes (Addendum)
Subjective:   Melody Hansen is a 66 y.o. female who presents for Medicare Annual (Subsequent) preventive examination.  Review of Systems    No ROS.  Medicare Wellness Virtual Visit.  Visual/audio telehealth visit, UTA vital signs.   See social history for additional risk factors.   Cardiac Risk Factors include: advanced age (>64men, >35 women)     Objective:    Today's Vitals   08/06/21 1333  Weight: 118 lb (53.5 kg)  Height: 5' 3.5" (1.613 m)   Body mass index is 20.57 kg/m.  Advanced Directives 08/06/2021 08/03/2020 08/03/2019 07/17/2017 04/16/2016  Does Patient Have a Medical Advance Directive? No No No No No  Would patient like information on creating a medical advance directive? No - Patient declined Yes (MAU/Ambulatory/Procedural Areas - Information given) No - Patient declined No - Patient declined -    Current Medications (verified) Outpatient Encounter Medications as of 08/06/2021  Medication Sig   Ascorbic Acid (VITAMIN C) 1000 MG tablet Take 1,000 mg by mouth daily.   Loratadine (ALAVERT PO) Take 1 tablet by mouth.   naratriptan (AMERGE) 2.5 MG tablet TAKE 1 TABLET BY MOUTH DAILY AS NEEDED FOR MIGRAINE. MAY REPEAT ONCE IN 4 HOURS IF NEEDED. MAX 2/DAY   Nutritional Supplements (JUICE PLUS FIBRE PO) Take 1 capsule by mouth. Plus 6 gummies   raloxifene (EVISTA) 60 MG tablet TAKE 1 TABLET BY MOUTH EVERY DAY   ALPRAZolam (XANAX) 0.25 MG tablet Take 1 tablet (0.25 mg total) by mouth daily as needed for anxiety. (Patient not taking: Reported on 08/06/2021)   atomoxetine (STRATTERA) 60 MG capsule Take 60 mg by mouth every morning. (Patient not taking: Reported on 08/06/2021)   atomoxetine (STRATTERA) 80 MG capsule Take 80 mg by mouth every morning.   LOTEMAX 0.5 % OINT SMARTSIG:0.5 Strip(s) In Eye(s) Every Night (Patient not taking: Reported on 08/06/2021)   meloxicam (MOBIC) 15 MG tablet Take 15 mg by mouth daily. (Patient not taking: Reported on 12/06/2020)   No  facility-administered encounter medications on file as of 08/06/2021.    Allergies (verified) Penicillins   History: Past Medical History:  Diagnosis Date   Attention or concentration deficit 06/27/2021   Cervicalgia 04/21/2015   Closed fracture of multiple ribs of right side with delayed healing 05/23/2016   Hyperlipidemia    Impingement syndrome of right shoulder 02/23/2016   Migraine headache 11/20/2016   Osteoporosis 04/15/2020   Bone Density scores received, she has bone loss which is significant  I recommend treating with medication but would need to discuss the pros and cons in an office visit continue calcium 1200 to 1800 mg daily through diet and supplements ,  2000 units of vitamin d and weight bearing exercise on a regular basis, and make appt to discuss    PMB (postmenopausal bleeding) 06/23/2019   Postmenopausal atrophic vaginitis 08/23/2017   PSVT (paroxysmal supraventricular tachycardia)    Recurrent headache 07/26/2017   Shingles 07/05/2014   Urethral polyp 06/23/2019   Vitamin D deficiency 05/23/2016   Past Surgical History:  Procedure Laterality Date   APPENDECTOMY     AUGMENTATION MAMMAPLASTY Bilateral    25 years ago   [place]  March 2015   [place], [place] for SVT    OVARIAN CYST REMOVAL     removal calcification right sternoclavicular joint     TONSILLECTOMY     Family History  Problem Relation Age of Onset   Heart attack  Mother    COPD Mother    Stroke Father    Heart disease Father 32   Hyperlipidemia Father    COPD Father    Hyperlipidemia Brother    Obesity Brother    Hyperlipidemia Other        family history   Breast cancer Neg Hx    Social History   Socioeconomic History   Marital status: Married    Spouse name: Not on file   Number of children: Not on file   Years of education: 12   Highest education level: High school graduate  Occupational History    Occupation: Retired    Comment: owned Estate agent  Tobacco Use   Smoking status: Never   Smokeless tobacco: Never  Substance and Sexual Activity   Alcohol use: Yes    Alcohol/week: 4.0 - 8.0 standard drinks    Types: 4 - 8 Glasses of wine per week    Comment: 1-2 glasses of wine most nights   Drug use: No   Sexual activity: Not on file  Other Topics Concern   Not on file  Social History Narrative   Not on file   Social Determinants of Health   Financial Resource Strain: Low Risk    Difficulty of Paying Living Expenses: Not hard at all  Food Insecurity: No Food Insecurity   Worried About Charity fundraiser in the Last Year: Never true   Ran Out of Food in the Last Year: Never true  Transportation Needs: No Transportation Needs   Lack of Transportation (Medical): No   Lack of Transportation (Non-Medical): No  Physical Activity: Not on file  Stress: No Stress Concern Present   Feeling of Stress : Not at all  Social Connections: Unknown   Frequency of Communication with Friends and Family: Not on file   Frequency of Social Gatherings with Friends and Family: Not on file   Attends Religious Services: Not on file   Active Member of Clubs or Organizations: Not on file   Attends Archivist Meetings: Not on file   Marital Status: Married    Tobacco Counseling Counseling given: Not Answered   Clinical Intake:  Pre-visit preparation completed: Yes        Diabetes: No  How often do you need to have someone help you when you read instructions, pamphlets, or other written materials from your doctor or pharmacy?: 1 - Never    Interpreter Needed?: No      Activities of Daily Living In your present state of health, do you have any difficulty performing the following activities: 08/06/2021  Hearing? N  Vision? N  Difficulty concentrating or making decisions? N  Walking or climbing stairs? N  Dressing or bathing? N  Doing errands, shopping? N  Preparing  Food and eating ? N  Using the Toilet? N  In the past six months, have you accidently leaked urine? N  Do you have problems with loss of bowel control? N  Managing your Medications? N  Managing your Finances? N  Housekeeping or managing your Housekeeping? N  Some recent data might be hidden    Patient Care Team: Crecencio Mc, MD as PCP - General (Internal Medicine)  Indicate any recent Medical Services you may have received from other than [facility] providers in the past year (date may be approximate).     Assessment:   This is a routine wellness examination for Jolanta.  Virtual Visit via Telephone Note  I connected with  Lane Kjos on 08/06/21 at  1:15 PM EST by telephone and verified that I am speaking with the correct person using two identifiers.  Persons participating in the virtual visit: patient/Nurse Health Advisor  I discussed the limitations, risks, security and privacy concerns of performing an evaluation and management service by telephone.The patient expressed understanding and agreed to proceed. Some vital signs may be absent or patient reported.   Hearing/Vision screen Hearing Screening - Comments:: Patient is able to hear conversational tones without difficulty. No issues reported. Vision Screening - Comments:: Dry eye specialist, [place] Followed by [facility] Wears reading glasses only Cataract extraction, bilateral  Dietary issues and exercise activities discussed: Current Exercise Habits: Home exercise routine, Time (Minutes): 50, Frequency (Times/Week): 3, Weekly Exercise (Minutes/Week): 150, Intensity: Mild Regular diet Good water intake    Goals Addressed               This Visit's Progress     Patient Stated     I'd like to learn a new language (pt-stated)        Learn to speak [place]      COMPLETED: Weight (lb) < 132 lb (59.9 kg) (pt-stated)   118 lb (53.5 kg)     I want to lose about 8lb by eating a healthy diet and  increasing physical activity.        Depression Screen PHQ 2/9 Scores 08/06/2021 12/06/2020 08/03/2020 08/03/2019 07/24/2017 04/08/2016  PHQ - 2 Score 0 0 0 0 0 0  PHQ- 9 Score - - - - 7 -    Fall Risk Fall Risk  08/06/2021 12/06/2020 08/03/2020 05/17/2020 08/03/2019  Falls in the past year? 0 0 0 0 0  Comment - - - - -  Number falls in past yr: 0 - 0 - -  Injury with Fall? - - 0 - -  Follow up Falls evaluation completed Falls evaluation completed Falls evaluation completed Falls evaluation completed Falls evaluation completed    [place]: Home free of loose throw rugs in walkways, pet beds, electrical cords, etc? Yes  Adequate lighting in your home to reduce risk of falls? Yes   ASSISTIVE DEVICES UTILIZED TO PREVENT FALLS: Life alert? No  Use of a cane, walker or w/c? No   TIMED UP AND GO: Was the test performed? No .   Cognitive Function:  Patient is alert and oriented x3.    6CIT Screen 08/03/2019  What Year? 0 points  What month? 0 points  What time? 0 points  Count back from 20 0 points  Months in reverse 0 points  Repeat phrase 0 points  Total Score 0    Immunizations Immunization History  Administered Date(s) Administered   Fluad Quad(high Dose 65+) 02/21/2016   Influenza, High Dose Seasonal PF 04/09/2019, 04/12/2021   Influenza-Unspecified 03/11/2014, 03/08/2015, 02/21/2016, 03/22/2016, 03/31/2017, 03/31/2020   PFIZER(Purple Top)SARS-COV-2 Vaccination 08/27/2019, 09/23/2019, 06/14/2020   Tdap 03/11/2014   Shingrix Completed?: No.    Education has been provided regarding the importance of this vaccine. Patient has been advised to call insurance company to determine out of pocket expense if they have not yet received this vaccine. Advised may also receive vaccine at local pharmacy or Health Dept. Verbalized acceptance and understanding.  Screening Tests Health Maintenance  Topic Date Due   COVID-19 Vaccine (4 - Booster for Pfizer  series) 08/22/2021 (Originally 08/09/2020)   Zoster Vaccines- Shingrix (1 of 2) 11/03/2021 (Originally 07/30/2003)   Pneumonia  Vaccine 84+ Years old (1 - PCV) 08/06/2022 (Originally 07/29/2018)   Fecal DNA (Cologuard)  12/03/2022   MAMMOGRAM  06/26/2023   TETANUS/TDAP  03/11/2024   INFLUENZA VACCINE  Completed   DEXA SCAN  Completed   Hepatitis C Screening  Completed   HPV VACCINES  Aged Out   COLONOSCOPY (Pts 45-57yrs Insurance coverage will need to be confirmed)  Discontinued   Health Maintenance There are no preventive care reminders to display for this patient.  Lung Cancer Screening: (Low Dose CT Chest recommended if Age 64-80 years, 30 pack-year currently smoking OR have quit w/in 15years.) does not qualify.   Vision Screening: Recommended annual ophthalmology exams for early detection of glaucoma and other disorders of the eye.  Dental Screening: Recommended annual dental exams for proper oral hygiene  Community Resource Referral / Chronic Care Management: CRR required this visit?  No   CCM required this visit?  No      Plan:   Keep all routine maintenance appointments.   I have personally reviewed and noted the following in the patients chart:   Medical and social history Use of alcohol, tobacco or illicit drugs  Current medications and supplements including opioid prescriptions.  Functional ability and status Nutritional status Physical activity Advanced directives List of other physicians Hospitalizations, surgeries, and ER visits in previous 12 months Vitals Screenings to include cognitive, depression, and falls Referrals and appointments  In addition, I have reviewed and discussed with patient certain preventive protocols, quality metrics, and best practice recommendations. A written personalized care plan for preventive services as well as general preventive health recommendations were provided to patient via mychart.     OBrien-Blaney, Chandni Gagan L,  LPN   [id number]    I have reviewed the above information and agree with above.   Deborra Medina, MD

## 2021-08-16 ENCOUNTER — Other Ambulatory Visit (INDEPENDENT_AMBULATORY_CARE_PROVIDER_SITE_OTHER): Payer: Medicare Other

## 2021-08-16 ENCOUNTER — Other Ambulatory Visit: Payer: Self-pay

## 2021-08-16 DIAGNOSIS — E785 Hyperlipidemia, unspecified: Secondary | ICD-10-CM

## 2021-08-16 LAB — LIPID PANEL
Cholesterol: 263 mg/dL — ABNORMAL HIGH (ref 0–200)
HDL: 90.2 mg/dL (ref >39.00)
LDL Cholesterol: 157 mg/dL — ABNORMAL HIGH (ref 0–99)
NonHDL: 172.72
Total CHOL/HDL Ratio: 3
Triglycerides: 77 mg/dL (ref 0.0–149.0)
VLDL: 15.4 mg/dL (ref 0.0–40.0)

## 2021-08-16 LAB — COMPREHENSIVE METABOLIC PANEL
ALT: 17 U/L (ref 0–35)
AST: 25 U/L (ref 0–37)
Albumin: 4.6 g/dL (ref 3.5–5.2)
Alkaline Phosphatase: 45 U/L (ref 39–117)
BUN: 22 mg/dL (ref 6–23)
CO2: 32 mEq/L (ref 19–32)
Calcium: 10 mg/dL (ref 8.4–10.5)
Chloride: 102 mEq/L (ref 96–112)
Creatinine, Ser: 0.65 mg/dL (ref 0.40–1.20)
GFR: 90.7 mL/min (ref >60.00)
Glucose, Bld: 107 mg/dL — ABNORMAL HIGH (ref 70–99)
Potassium: 4 mEq/L (ref 3.5–5.1)
Sodium: 141 mEq/L (ref 135–145)
Total Bilirubin: 1 mg/dL (ref 0.2–1.2)
Total Protein: 7 g/dL (ref 6.0–8.3)

## 2021-08-18 ENCOUNTER — Encounter: Payer: Self-pay | Admitting: Internal Medicine

## 2021-08-20 ENCOUNTER — Telehealth: Payer: Self-pay

## 2021-08-20 NOTE — Telephone Encounter (Signed)
LMTCB for lab results.  

## 2021-08-21 NOTE — Telephone Encounter (Signed)
Patient returned office phone call for lab results. 

## 2021-08-23 NOTE — Telephone Encounter (Signed)
Done

## 2021-09-11 ENCOUNTER — Encounter: Payer: Self-pay | Admitting: Internal Medicine

## 2021-09-11 ENCOUNTER — Telehealth (INDEPENDENT_AMBULATORY_CARE_PROVIDER_SITE_OTHER): Payer: Medicare Other | Admitting: Internal Medicine

## 2021-09-11 VITALS — Ht 63.5 in | Wt 115.0 lb

## 2021-09-11 DIAGNOSIS — E7800 Pure hypercholesterolemia, unspecified: Secondary | ICD-10-CM

## 2021-09-11 DIAGNOSIS — I679 Cerebrovascular disease, unspecified: Secondary | ICD-10-CM

## 2021-09-11 NOTE — Patient Instructions (Signed)
Referral to Dr Fletcher Anon for coronary calcium CT scan in progress ? ? ?Consider adding famotidine (generic pepcid) 20 mg In the evening to block more histamine .  This can be combined with alavert ? ?Nasocort is a steroid nasal spray;  the bleeding can occur because chronic steroid use can cause thinning of the septum (the lining of your nasal bone) and this makes the capillaries more susceptible to rupture by vigourous blowing or sneezing . The antihistamines will not do this.  They can be taken twice daily  ? ? ?

## 2021-09-11 NOTE — Progress Notes (Signed)
Virtual Visit via Caregility  Note ? ?This visit type was conducted due to national recommendations for restrictions regarding the COVID-19 pandemic (e.g. social distancing).  This format is felt to be most appropriate for this patient at this time.  All issues noted in this document were discussed and addressed.  No physical exam was performed (except for noted visual exam findings with Video Visits).  ? ?I connected withNAME@ on 09/11/21 at  4:00 PM EDT by a video enabled telemedicine application and verified that I am speaking with the correct person using two identifiers. ?Location patient: home ?Location provider: work or home office ?Persons participating in the virtual visit: patient, provider ? ?I discussed the limitations, risks, security and privacy concerns of performing an evaluation and management service by telephone and the availability of in person appointments. I also discussed with the patient that there may be a patient responsible charge related to this service. The patient expressed understanding and agreed to proceed. ? ?Reason for visit: hyperlipidemia management  ? ?HPI: ? ?66 yr old female with recent neurologic workup for cognitive issues noted to have chronic small vessel ischemic changes on brain MRI.   ? ?Recent lipid testing noted LDL of 157, HDL 90.  AHA risk calculated at 7%   ? ?she has a FH of CVA and CAD .Marland Kitchen  Discussed the use of statins to prevent strokes.   She is ambivalent about statin therapy ? ? ? ?2) Borderline concentration.  Has been taking Straterra. ? ? ? ?ROS: See pertinent positives and negatives per HPI. ? ?Past Medical History:  ?Diagnosis Date  ? Attention or concentration deficit 06/27/2021  ? Cervicalgia 04/21/2015  ? Closed fracture of multiple ribs of right side with delayed healing 05/23/2016  ? Hyperlipidemia   ? Impingement syndrome of right shoulder 02/23/2016  ? Migraine headache 11/20/2016  ? Osteoporosis 04/15/2020  ? Bone Density scores received, she has  bone loss which is significant  I recommend treating with medication but would need to discuss the pros and cons in an office visit continue calcium 1200 to 1800 mg daily through diet and supplements ,  2000 units of vitamin d and weight bearing exercise on a regular basis, and make appt to discuss   ? PMB (postmenopausal bleeding) 06/23/2019  ? Postmenopausal atrophic vaginitis 08/23/2017  ? PSVT (paroxysmal supraventricular tachycardia)   ? Recurrent headache 07/26/2017  ? Shingles 07/05/2014  ? Urethral polyp 06/23/2019  ? Vitamin D deficiency 05/23/2016  ? ? ?Past Surgical History:  ?Procedure Laterality Date  ? APPENDECTOMY    ? AUGMENTATION MAMMAPLASTY Bilateral   ? 25 years ago  ? BREAST ENHANCEMENT SURGERY    ? CARDIAC ELECTROPHYSIOLOGY STUDY AND ABLATION  March 2015  ? [facility], [place] for SVT   ? OVARIAN CYST REMOVAL    ? removal calcification right sternoclavicular joint    ? TONSILLECTOMY    ? ? ?Family History  ?Problem Relation Age of Onset  ? Heart attack Mother   ? COPD Mother   ? Stroke Father   ? Heart disease Father 58  ? Hyperlipidemia Father   ? COPD Father   ? Hyperlipidemia Brother   ? Obesity Brother   ? Hyperlipidemia Other   ?     family history  ? Breast cancer Neg Hx   ? ? ?SOCIAL HX:  reports that she has never smoked. She has never used smokeless tobacco. She reports current alcohol use of about 4.0 - 8.0 standard drinks per  week. She reports that she does not use drugs.  ? ? ?Current Outpatient Medications:  ?  Ascorbic Acid (VITAMIN C) 1000 MG tablet, Take 1,000 mg by mouth daily., Disp: , Rfl:  ?  atomoxetine (STRATTERA) 80 MG capsule, Take 80 mg by mouth every morning., Disp: , Rfl:  ?  Loratadine (ALAVERT PO), Take 1 tablet by mouth., Disp: , Rfl:  ?  naratriptan (AMERGE) 2.5 MG tablet, TAKE 1 TABLET BY MOUTH DAILY AS NEEDED FOR MIGRAINE. MAY REPEAT ONCE IN 4 HOURS IF NEEDED. MAX 2/DAY, Disp: 10 tablet, Rfl: 5 ?  Nutritional Supplements (JUICE PLUS FIBRE PO), Take 1 capsule by  mouth. Plus 6 gummies, Disp: , Rfl:  ?  raloxifene (EVISTA) 60 MG tablet, TAKE 1 TABLET BY MOUTH EVERY DAY, Disp: 30 tablet, Rfl: 7 ?  ALPRAZolam (XANAX) 0.25 MG tablet, Take 1 tablet (0.25 mg total) by mouth daily as needed for anxiety. (Patient not taking: Reported on 08/06/2021), Disp: 20 tablet, Rfl: 0 ? ?EXAM: ? ?VITALS per patient if applicable: ? ?GENERAL: alert, oriented, appears well and in no acute distress ? ?HEENT: atraumatic, conjunttiva clear, no obvious abnormalities on inspection of external nose and ears ? ?NECK: normal movements of the head and neck ? ?LUNGS: on inspection no signs of respiratory distress, breathing rate appears normal, no obvious gross SOB, gasping or wheezing ? ?CV: no obvious cyanosis ? ?MS: moves all visible extremities without noticeable abnormality ? ?PSYCH/NEURO: pleasant and cooperative, no obvious depression or anxiety, speech and thought processing grossly intact ? ?ASSESSMENT AND PLAN: ? ?Discussed the following assessment and plan: ? ?Pure hypercholesterolemia ? ?Cerebral vascular disease ? ?Cerebral vascular disease ?chronic small vessel ischemic changes noted on recent MRI brain.  Statin therapy advised but deferred . Referring to Dr. Fletcher Anon for cardiac risk stratification with coronary calcium CT  ? ?  ?I discussed the assessment and treatment plan with the patient. The patient was provided an opportunity to ask questions and all were answered. The patient agreed with the plan and demonstrated an understanding of the instructions. ?  ?The patient was advised to call back or seek an in-person evaluation if the symptoms worsen or if the condition fails to improve as anticipated. ? ? ?I spent 30 minutes dedicated to the care of this patient on the date of this encounter to include pre-visit review of his medical history,  Face-to-face time with the patient , and post visit ordering of testing and therapeutics.  ? ? ?Crecencio Mc, MD   ?

## 2021-09-11 NOTE — Assessment & Plan Note (Signed)
chronic small vessel ischemic changes noted on recent MRI brain.  Statin therapy advised but deferred . Referring to Dr. Fletcher Anon for cardiac risk stratification with coronary calcium CT  ?

## 2021-10-23 ENCOUNTER — Ambulatory Visit: Payer: Medicare Other | Admitting: Cardiovascular Disease

## 2021-10-23 NOTE — Progress Notes (Deleted)
Cardiology Office Note   Date:  10/23/2021   ID:  Melody Hansen, DOB [birth year], MRN [id number]  PCP:  Crecencio Mc, MD  Cardiologist:  Kathlyn Sacramento, MD   No chief complaint on file.     History of Present Illness: Melody Hansen is a 66 y.o. female who presents for *** This is a 66 year old female who is here today for a followup visit. She has known history of paroxysmal supraventricular tachycardia. She underwent evaluation with an echocardiogram in 2013 which  was normal. Treadmill stress test was done also due to atypical chest pain which was normal and showed no evidence of exercise induced arrhythmia. She stopped by our office in April due to an episode of palpitations and dizziness. By the time she arrived, she was feeling better and EKG showed normal sinus rhythm. I requested a two-week outpatient telemetry which showed no evidence of arrhythmia.   Past Medical History:  Diagnosis Date   Attention or concentration deficit 06/27/2021   Cervicalgia 04/21/2015   Closed fracture of multiple ribs of right side with delayed healing 05/23/2016   Hyperlipidemia    Impingement syndrome of right shoulder 02/23/2016   Migraine headache 11/20/2016   Osteoporosis 04/15/2020   Bone Density scores received, she has bone loss which is significant  I recommend treating with medication but would need to discuss the pros and cons in an office visit continue calcium 1200 to 1800 mg daily through diet and supplements ,  2000 units of vitamin d and weight bearing exercise on a regular basis, and make appt to discuss    PMB (postmenopausal bleeding) 06/23/2019   Postmenopausal atrophic vaginitis 08/23/2017   PSVT (paroxysmal supraventricular tachycardia)    Recurrent headache 07/26/2017   Shingles 07/05/2014   Urethral polyp 06/23/2019   Vitamin D deficiency 05/23/2016    Past Surgical History:  Procedure Laterality Date   APPENDECTOMY     AUGMENTATION MAMMAPLASTY  Bilateral    25 years ago   [place]  March 2015   [place], [place] for SVT    OVARIAN CYST REMOVAL     removal calcification right sternoclavicular joint     TONSILLECTOMY       Current Outpatient Medications  Medication Sig Dispense Refill   ALPRAZolam (XANAX) 0.25 MG tablet Take 1 tablet (0.25 mg total) by mouth daily as needed for anxiety. (Patient not taking: Reported on 08/06/2021) 20 tablet 0   Ascorbic Acid (VITAMIN C) 1000 MG tablet Take 1,000 mg by mouth daily.     atomoxetine (STRATTERA) 80 MG capsule Take 80 mg by mouth every morning.     Loratadine (ALAVERT PO) Take 1 tablet by mouth.     naratriptan (AMERGE) 2.5 MG tablet TAKE 1 TABLET BY MOUTH DAILY AS NEEDED FOR MIGRAINE. MAY REPEAT ONCE IN 4 HOURS IF NEEDED. MAX 2/DAY 10 tablet 5   Nutritional Supplements (JUICE PLUS FIBRE PO) Take 1 capsule by mouth. Plus 6 gummies     raloxifene (EVISTA) 60 MG tablet TAKE 1 TABLET BY MOUTH EVERY DAY 30 tablet 7   No current facility-administered medications for this visit.    Allergies:   Penicillins    Social History:  The patient  reports that she has never smoked. She has never used smokeless tobacco. She reports current alcohol use of about 4.0 - 8.0 standard drinks per week. She reports that she does not use drugs.   Family  History:  The patient's ***family history includes COPD in her father and mother; Heart attack in her mother; Heart disease (age of onset: 2) in her father; Hyperlipidemia in her brother, father, and another family member; Obesity in her brother; Stroke in her father.    ROS:  Please see the history of present illness.   Otherwise, review of systems are positive for {NONE DEFAULTED:18576}.   All other systems are reviewed and negative.    PHYSICAL EXAM: VS:  There were no vitals taken for this visit. , BMI There is no height or weight on file to calculate BMI. GEN: Well nourished, well  developed, in no acute distress  HEENT: normal  Neck: no JVD, carotid bruits, or masses Cardiac: ***RRR; no murmurs, rubs, or gallops,no edema  Respiratory:  clear to auscultation bilaterally, normal work of breathing GI: soft, nontender, nondistended, + BS MS: no deformity or atrophy  Skin: warm and dry, no rash Neuro:  Strength and sensation are intact Psych: euthymic mood, full affect   EKG:  EKG {ACTION; IS/IS [id number]} ordered today. The ekg ordered today demonstrates ***   Recent Labs: 12/11/2020: TSH 0.68 08/16/2021: ALT 17; BUN 22; Creatinine, Ser 0.65; Potassium 4.0; Sodium 141    Lipid Panel    Component Value Date/Time   CHOL 263 (H) 08/16/2021 0848   TRIG 77.0 08/16/2021 0848   HDL 90.20 08/16/2021 0848   CHOLHDL 3 08/16/2021 0848   VLDL 15.4 08/16/2021 0848   LDLCALC 157 (H) 08/16/2021 0848   LDLDIRECT 159.0 06/23/2019 1444      Wt Readings from Last 3 Encounters:  09/11/21 115 lb (52.2 kg)  08/06/21 118 lb (53.5 kg)  12/06/20 118 lb (53.5 kg)      Other studies Reviewed: Additional studies/ records that were reviewed today include: ***. Review of the above records demonstrates: ***      View : No data to display.            ASSESSMENT AND PLAN:  1.  ***    Disposition:   FU with *** in {gen number [id number]} {Days to years:10300}  Signed,  Kathlyn Sacramento, MD  10/23/2021 1:09 PM    [place]

## 2021-10-30 ENCOUNTER — Ambulatory Visit: Payer: Medicare Other | Admitting: Cardiovascular Disease

## 2021-10-30 VITALS — BP 110/80 | HR 84 | Ht 63.5 in | Wt 116.1 lb

## 2021-10-30 DIAGNOSIS — E7800 Pure hypercholesterolemia, unspecified: Secondary | ICD-10-CM | POA: Diagnosis not present

## 2021-10-30 DIAGNOSIS — I471 Supraventricular tachycardia: Secondary | ICD-10-CM

## 2021-10-30 DIAGNOSIS — Z136 Encounter for screening for cardiovascular disorders: Secondary | ICD-10-CM | POA: Diagnosis not present

## 2021-10-30 NOTE — Patient Instructions (Signed)
Medication Instructions:  ?Your physician recommends that you continue on your current medications as directed. Please refer to the Current Medication list given to you today. ? ?*If you need a refill on your cardiac medications before your next appointment, please call your pharmacy* ? ? ?Lab Work: ?None ordered ?If you have labs (blood work) drawn today and your tests are completely normal, you will receive your results only by: ?MyChart Message (if you have MyChart) OR ?A paper copy in the mail ?If you have any lab test that is abnormal or we need to change your treatment, we will call you to review the results. ? ? ?Testing/Procedures: ?Dr. Fletcher Anon has ordered a CT coronary calcium score.  ? ?Test locations:  ?[place] ?[place] ?[place], [place] ? ?Please call [phone] to schedule ? ?This is $99 out of pocket. ? ? ?Coronary CalciumScan ?A coronary calcium scan is an imaging test used to look for deposits of calcium and other fatty materials (plaques) in the inner lining of the blood vessels of the heart (coronary arteries). These deposits of calcium and plaques can partly clog and narrow the coronary arteries without producing any symptoms or warning signs. This puts a person at risk for a heart attack. This test can detect these deposits before symptoms develop. ?Tell a health care provider about: ?Any allergies you have. ?All medicines you are taking, including vitamins, herbs, eye drops, creams, and over-the-counter medicines. ?Any problems you or family members have had with anesthetic medicines. ?Any blood disorders you have. ?Any surgeries you have had. ?Any medical conditions you have. ?Whether you are pregnant or may be pregnant. ?What are the risks? ?Generally, this is a safe procedure. However, problems may occur, including: ?Harm to a pregnant woman and her unborn baby. This test involves the use of radiation. Radiation exposure can be dangerous to a  pregnant woman and her unborn baby. If you are pregnant, you generally should not have this procedure done. ?Slight increase in the risk of cancer. This is because of the radiation involved in the test. ?What happens before the procedure? ?No preparation is needed for this procedure. ?What happens during the procedure? ?You will undress and remove any jewelry around your neck or chest. ?You will put on a hospital gown. ?Sticky electrodes will be placed on your chest. The electrodes will be connected to an electrocardiogram (ECG) machine to record a tracing of the electrical activity of your heart. ?A CT scanner will take pictures of your heart. During this time, you will be asked to lie still and hold your breath for 2-3 seconds while a picture of your heart is being taken. ?The procedure may vary among health care providers and hospitals. ?What happens after the procedure? ?You can get dressed. ?You can return to your normal activities. ?It is up to you to get the results of your test. Ask your health care provider, or the department that is doing the test, when your results will be ready. ?Summary ?A coronary calcium scan is an imaging test used to look for deposits of calcium and other fatty materials (plaques) in the inner lining of the blood vessels of the heart (coronary arteries). ?Generally, this is a safe procedure. Tell your health care provider if you are pregnant or may be pregnant. ?No preparation is needed for this procedure. ?A CT scanner will take pictures of your heart. ?You can return to your normal activities after the scan is done. ?This information is  not intended to replace advice given to you by your health care provider. Make sure you discuss any questions you have with your health care provider. ?Document Released: 11/30/2007 Document Revised: 04/22/2016 Document Reviewed: 04/22/2016 ?Elsevier Interactive Patient Education ? 2017 Elsevier Inc.  ? ? ?Follow-Up: ?At [facility], you and  your health needs are our priority.  As part of our continuing mission to provide you with exceptional heart care, we have created designated Provider Care Teams.  These Care Teams include your primary Cardiologist (physician) and Advanced Practice Providers (APPs -  Physician Assistants and Nurse Practitioners) who all work together to provide you with the care you need, when you need it. ? ?We recommend signing up for the patient portal called "MyChart".  Sign up information is provided on this After Visit Summary.  MyChart is used to connect with patients for Virtual Visits (Telemedicine).  Patients are able to view lab/test results, encounter notes, upcoming appointments, etc.  Non-urgent messages can be sent to your provider as well.   ?To learn more about what you can do with MyChart, go to [email or web address].   ? ?Your next appointment:   ?As needed ? ?The format for your next appointment:   ?In Person ? ?Provider:   ?You may see Kathlyn Sacramento, MD or one of the following Advanced Practice Providers on your designated Care Team:   ?Murray Hodgkins, NP ?Christell Faith, PA-C ?Cadence Kathlen Mody, PA-C ? ? ?Other Instructions ?N/A ? ?Important Information About Sugar ? ? ? ? ? ? ?

## 2021-10-30 NOTE — Progress Notes (Signed)
?  ?Cardiology Office Note ? ? ?Date:  10/30/2021  ? ?ID:  Melody Hansen, DOB Nov 09, 1955, MRN [id number] ? ?PCP:  Crecencio Mc, MD  ?Cardiologist:   Kathlyn Sacramento, MD  ? ?Chief Complaint  ?Patient presents with  ? Other  ?  Hypercholesterolemia. Meds reviewed verbally with pt.  ? ? ?  ?History of Present Illness: ?Melody Hansen is a 66 y.o. female who presents for evaluation of hyperlipidemia and cardiovascular risk. ?She has known history of paroxysmal supraventricular tachycardia status post successful ablation in March 2015.  She has been doing well with no recent chest pain, shortness of breath or palpitations.  She exercises regularly and plays pickle ball.  She had recent evaluation for possible ADHD.  She had brain MRI done which showed possible mild chronic microvascular ischemic changes.  She is not a smoker.  She does have family history of coronary artery disease and hyperlipidemia.  She is known to have elevated LDL but at the same time her HDL is also high. ? ? ?Past Medical History:  ?Diagnosis Date  ? Attention or concentration deficit 06/27/2021  ? Cervicalgia 04/21/2015  ? Closed fracture of multiple ribs of right side with delayed healing 05/23/2016  ? Hyperlipidemia   ? Impingement syndrome of right shoulder 02/23/2016  ? Migraine headache 11/20/2016  ? Osteoporosis 04/15/2020  ? Bone Density scores received, she has bone loss which is significant  I recommend treating with medication but would need to discuss the pros and cons in an office visit continue calcium 1200 to 1800 mg daily through diet and supplements ,  2000 units of vitamin d and weight bearing exercise on a regular basis, and make appt to discuss   ? PMB (postmenopausal bleeding) 06/23/2019  ? Postmenopausal atrophic vaginitis 08/23/2017  ? PSVT (paroxysmal supraventricular tachycardia)   ? Recurrent headache 07/26/2017  ? Shingles 07/05/2014  ? Urethral polyp 06/23/2019  ? Vitamin D deficiency 05/23/2016  ? ? ?Past  Surgical History:  ?Procedure Laterality Date  ? APPENDECTOMY    ? AUGMENTATION MAMMAPLASTY Bilateral   ? 25 years ago  ? BREAST ENHANCEMENT SURGERY    ? CARDIAC ELECTROPHYSIOLOGY STUDY AND ABLATION  March 2015  ? Dorothea Ogle, [place] for SVT   ? OVARIAN CYST REMOVAL    ? removal calcification right sternoclavicular joint    ? TONSILLECTOMY    ? ? ? ?Current Outpatient Medications  ?Medication Sig Dispense Refill  ? ALPRAZolam (XANAX) 0.25 MG tablet Take 1 tablet (0.25 mg total) by mouth daily as needed for anxiety. 20 tablet 0  ? atomoxetine (STRATTERA) 80 MG capsule Take 80 mg by mouth every morning.    ? Loratadine (ALAVERT PO) Take 1 tablet by mouth.    ? naratriptan (AMERGE) 2.5 MG tablet TAKE 1 TABLET BY MOUTH DAILY AS NEEDED FOR MIGRAINE. MAY REPEAT ONCE IN 4 HOURS IF NEEDED. MAX 2/DAY 10 tablet 5  ? NON FORMULARY Skinade Takes 1 drink per day.    ? Nutritional Supplements (JUICE PLUS FIBRE PO) Take 1 capsule by mouth. Plus 6 gummies    ? raloxifene (EVISTA) 60 MG tablet TAKE 1 TABLET BY MOUTH EVERY DAY 30 tablet 7  ? ?No current facility-administered medications for this visit.  ? ? ?Allergies:   Penicillins  ? ? ?Social History:  The patient  reports that she has never smoked. She has never used smokeless tobacco. She reports current alcohol use of about 4.0 - 8.0 standard drinks per week. She reports that  she does not use drugs.  ? ?Family History:  The patient's family history includes COPD in her father and mother; Heart attack in her mother; Heart disease (age of onset: 73) in her father; Hyperlipidemia in her brother, father, and another family member; Obesity in her brother; Stroke in her father.  ? ? ?ROS:  Please see the history of present illness.   Otherwise, review of systems are positive for none.   All other systems are reviewed and negative.  ? ? ?PHYSICAL EXAM: ?VS:  BP 110/80 (BP Location: Right Arm, Patient Position: Sitting, Cuff Size: Normal)   Pulse 84   Ht 5' 3.5" (1.613 m)   Wt 116 lb  2 oz (52.7 kg)   SpO2 98%   BMI 20.25 kg/m?  , BMI Body mass index is 20.25 kg/m?. ?GEN: Well nourished, well developed, in no acute distress  ?HEENT: normal  ?Neck: no JVD, carotid bruits, or masses ?Cardiac: RRR; no murmurs, rubs, or gallops,no edema  ?Respiratory:  clear to auscultation bilaterally, normal work of breathing ?GI: soft, nontender, nondistended, + BS ?MS: no deformity or atrophy  ?Skin: warm and dry, no rash ?Neuro:  Strength and sensation are intact ?Psych: euthymic mood, full affect ? ? ?EKG:  EKG is ordered today. ?The ekg ordered today demonstrates normal sinus rhythm with no significant ST or T wave changes. ? ? ?Recent Labs: ?12/11/2020: TSH 0.68 ?08/16/2021: ALT 17; BUN 22; Creatinine, Ser 0.65; Potassium 4.0; Sodium 141  ? ? ?Lipid Panel ?   ?Component Value Date/Time  ? CHOL 263 (H) 08/16/2021 0848  ? TRIG 77.0 08/16/2021 0848  ? HDL 90.20 08/16/2021 0848  ? CHOLHDL 3 08/16/2021 0848  ? VLDL 15.4 08/16/2021 0848  ? [place] 157 (H) 08/16/2021 0848  ? LDLDIRECT 159.0 06/23/2019 1444  ? ?  ? ?Wt Readings from Last 3 Encounters:  ?10/30/21 116 lb 2 oz (52.7 kg)  ?09/11/21 115 lb (52.2 kg)  ?08/06/21 118 lb (53.5 kg)  ?  ? ? ? ? ?  10/30/2021  ?  2:47 PM  ?PAD Screen  ?Previous PAD dx? No  ?Previous surgical procedure? No  ?Pain with walking? No  ?Feet/toe relief with dangling? No  ?Painful, non-healing ulcers? No  ?Extremities discolored? No  ? ? ? ? ?ASSESSMENT AND PLAN: ? ?1.  Screening for atherosclerotic cardiovascular disease: She does have family history of hyperlipidemia and coronary artery disease.  Recommend CT calcium score for evaluation.  If completely normal, can consider repeat testing in 3 years. ? ?2.  Hyperlipidemia: She has chronically elevated LDL with most recent LDL of 156.  However, her HDL is also chronically elevated and most recently was 90.  Triglyceride was 77.  Treatment will be decided based on the results of CT calcium score.  Her 10-year cardiovascular risk is 5.8%  and thus, below the threshold of treatment. ? ?3.  History of paroxysmal supraventricular tachycardia: Status post successful ablation in 2015 with no evidence of recurrent arrhythmia.  EKG today is normal. ? ? ? ?Disposition:   FU with me as needed. ? ?Signed, ? ?Kathlyn Sacramento, MD  ?10/30/2021 3:18 PM    ?[place] ?

## 2021-11-28 ENCOUNTER — Ambulatory Visit
Admission: RE | Admit: 2021-11-28 | Discharge: 2021-11-28 | Disposition: A | Discharge status: Home / Self Care | Payer: Medicare Other | Source: Ambulatory Visit | Attending: Cardiovascular Disease | Admitting: Cardiovascular Disease

## 2021-11-28 DIAGNOSIS — E7800 Pure hypercholesterolemia, unspecified: Secondary | ICD-10-CM | POA: Insufficient documentation

## 2021-12-04 ENCOUNTER — Ambulatory Visit: Payer: Medicare Other | Admitting: Internal Medicine

## 2021-12-04 IMAGING — MG DIGITAL SCREENING BREAST BILAT IMPLANT W/ TOMO W/ CAD
9 of 12 series · 9 of 28 positions shown · non-contrast
Comparison: Previous exam(s).

CLINICAL DATA: Screening.

EXAM:
DIGITAL SCREENING BILATERAL MAMMOGRAM WITH IMPLANTS, CAD AND TOMO
The patient has retropectoral implants. Standard and implant
displaced views were performed.

[R CC]
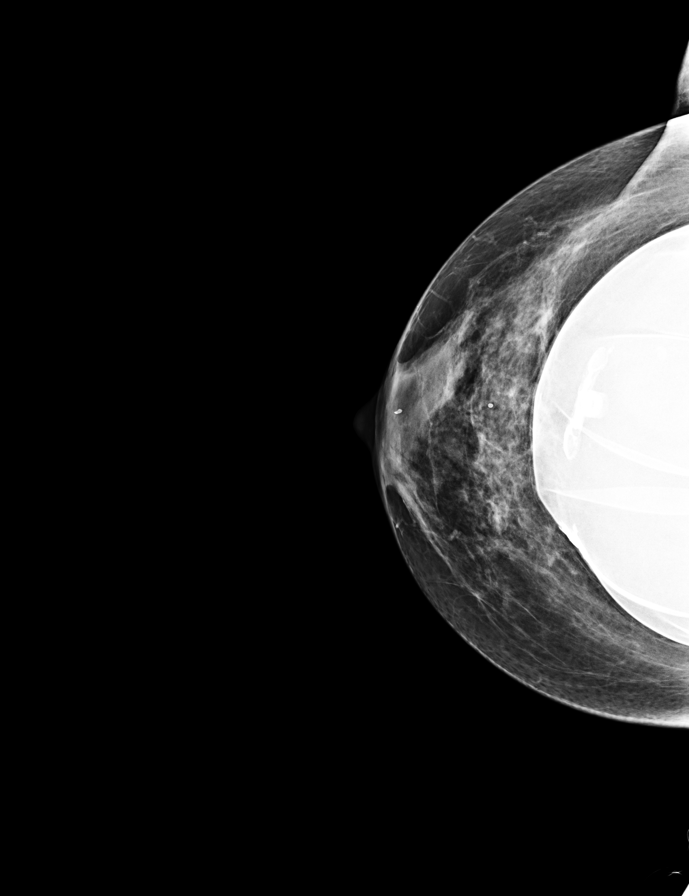

[L MLO]
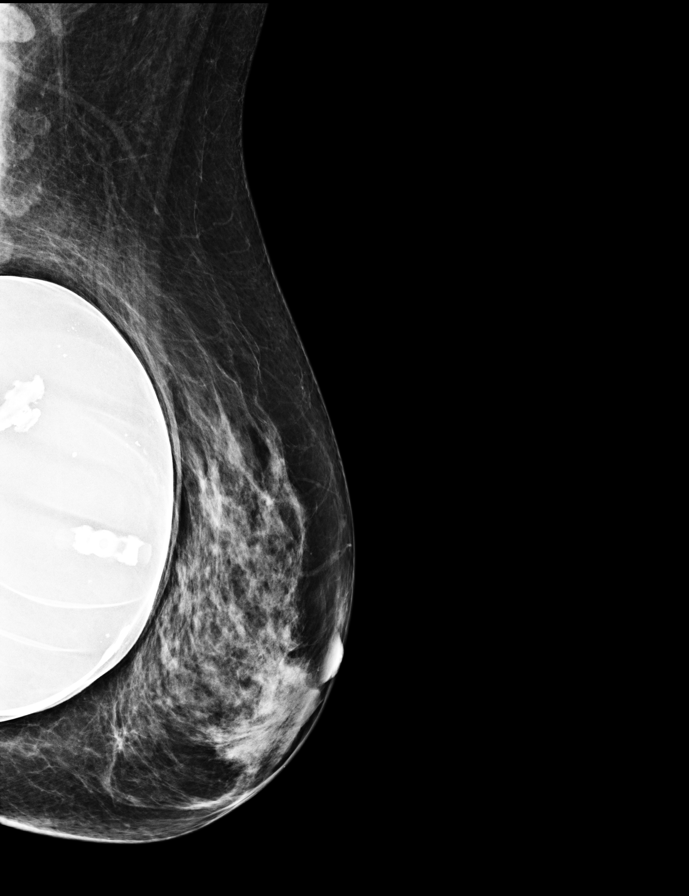

[R MLO]
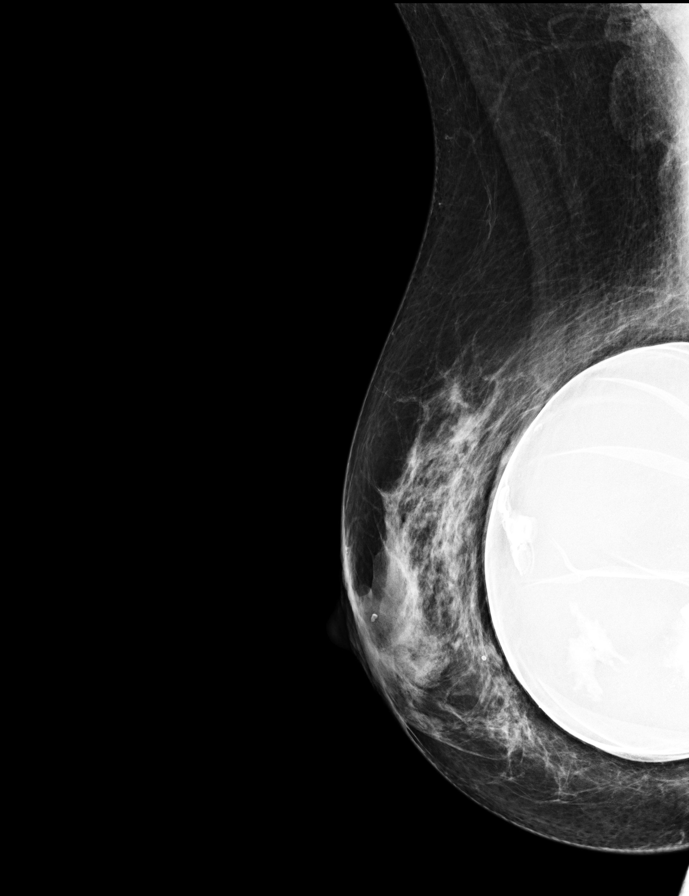

[L CC]
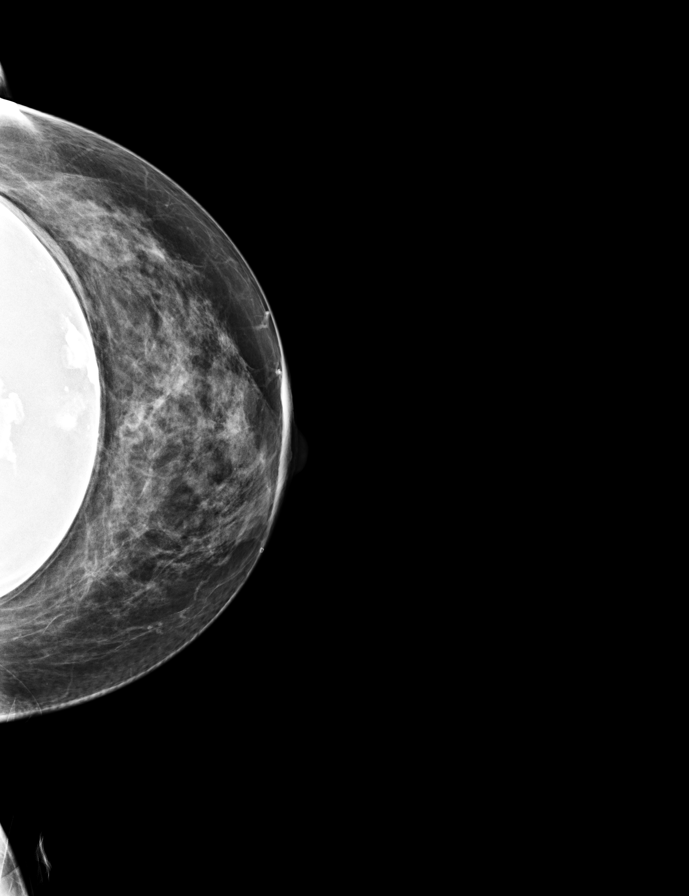

[R MLO synth-2D]
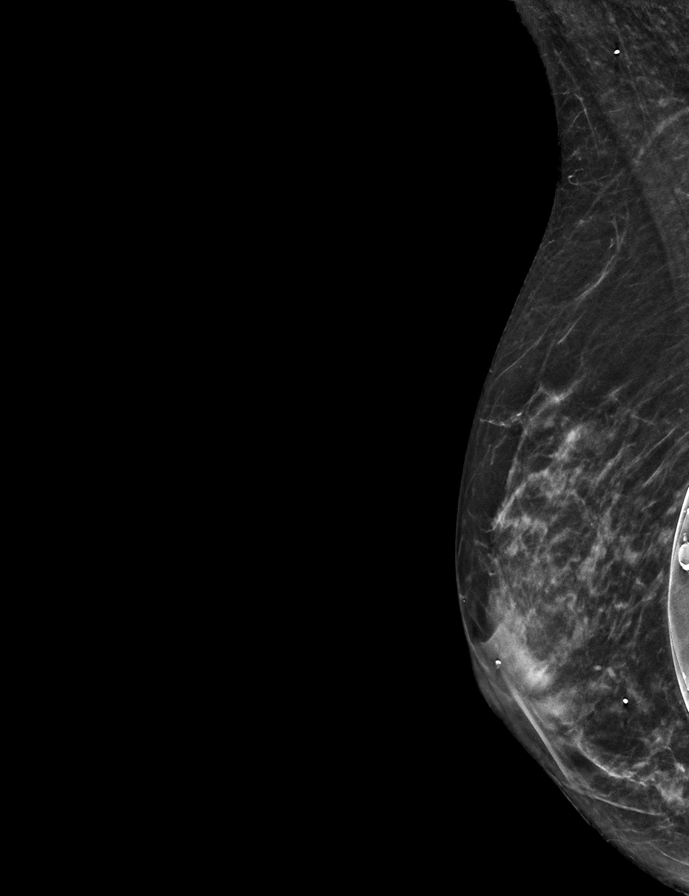

[R CC synth-2D]
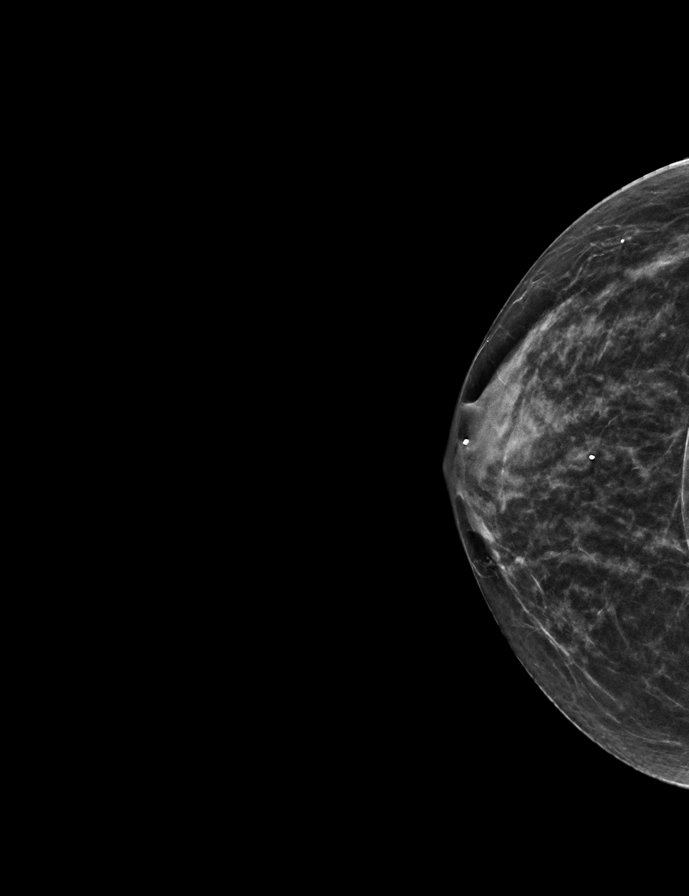

[L CC synth-2D]
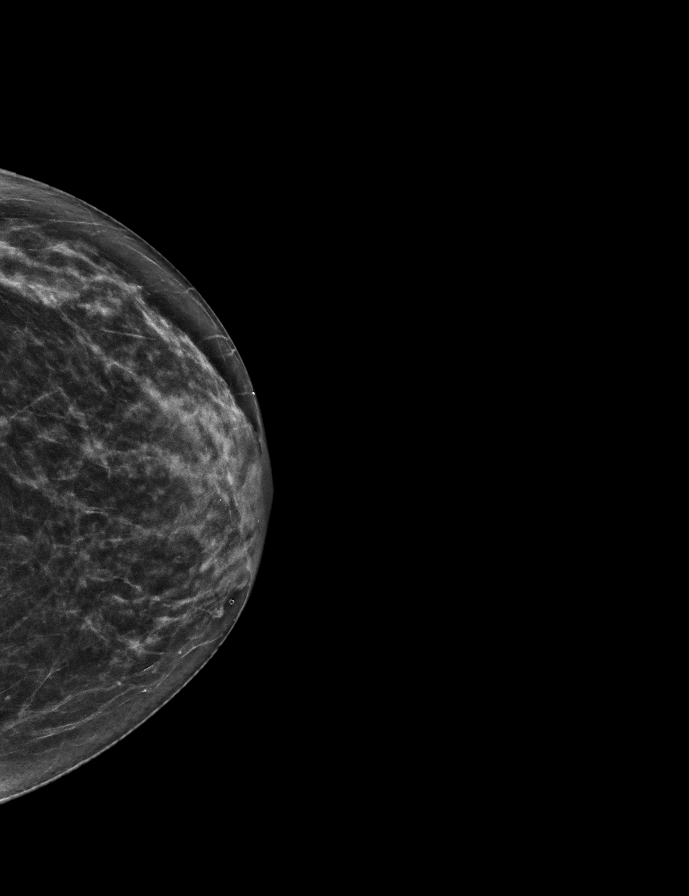

[L MLO synth-2D]
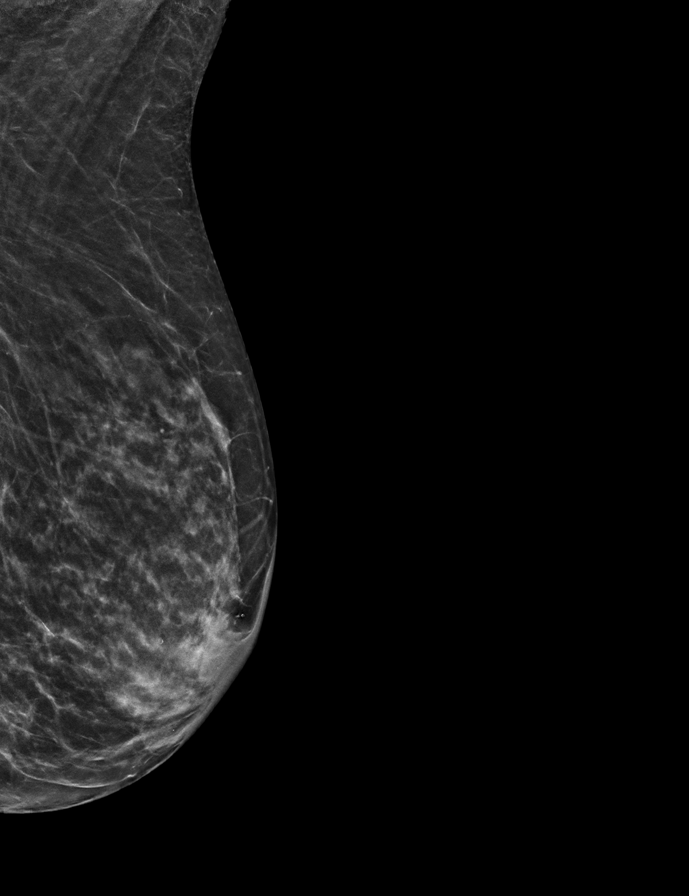

[R MLO tomo · tomo slice 25/48.0]
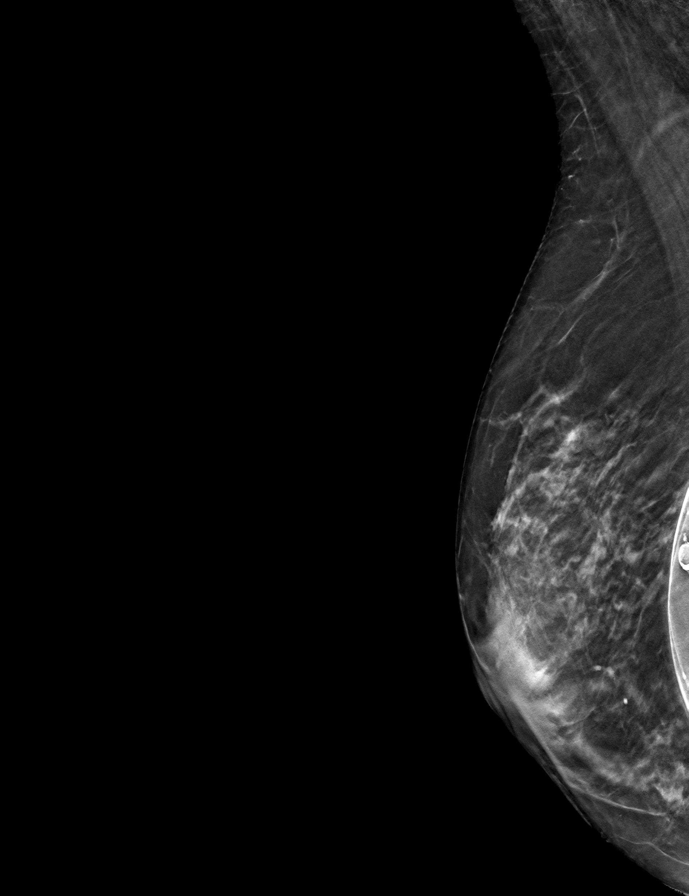

[9 of 28 positions shown; findings below may reference images not displayed]

ACR Breast Density Category c: The breast tissue is heterogeneously
dense, which may obscure small masses.
FINDINGS: There are no findings suspicious for malignancy. Images were
processed with CAD.
IMPRESSION: No mammographic evidence of malignancy. A result letter of this
screening mammogram will be mailed directly to the patient.

RECOMMENDATION:
Screening mammogram in one year. (Code:49-X-OQ9)

BI-RADS CATEGORY  1:  Negative.

## 2021-12-31 ENCOUNTER — Encounter: Payer: Self-pay | Admitting: Internal Medicine

## 2022-01-01 ENCOUNTER — Ambulatory Visit (INDEPENDENT_AMBULATORY_CARE_PROVIDER_SITE_OTHER): Payer: Medicare Other | Admitting: Internal Medicine

## 2022-01-01 ENCOUNTER — Encounter: Payer: Self-pay | Admitting: Internal Medicine

## 2022-01-01 VITALS — BP 114/72 | HR 88 | Temp 98.0°F | Resp 97 | Ht 63.0 in | Wt 117.4 lb

## 2022-01-01 DIAGNOSIS — Z0000 Encounter for general adult medical examination without abnormal findings: Secondary | ICD-10-CM | POA: Diagnosis not present

## 2022-01-01 DIAGNOSIS — M810 Age-related osteoporosis without current pathological fracture: Secondary | ICD-10-CM | POA: Diagnosis not present

## 2022-01-01 DIAGNOSIS — Z79899 Other long term (current) drug therapy: Secondary | ICD-10-CM | POA: Diagnosis not present

## 2022-01-01 DIAGNOSIS — E7800 Pure hypercholesterolemia, unspecified: Secondary | ICD-10-CM

## 2022-01-01 DIAGNOSIS — R7301 Impaired fasting glucose: Secondary | ICD-10-CM

## 2022-01-01 DIAGNOSIS — D492 Neoplasm of unspecified behavior of bone, soft tissue, and skin: Secondary | ICD-10-CM | POA: Diagnosis not present

## 2022-01-01 DIAGNOSIS — R41840 Attention and concentration deficit: Secondary | ICD-10-CM

## 2022-01-01 LAB — CBC WITH DIFFERENTIAL/PLATELET
Basophils Absolute: 0.1 10*3/uL (ref 0.0–0.1)
Basophils Relative: 1.7 % (ref 0.0–3.0)
Eosinophils Absolute: 0.1 10*3/uL (ref 0.0–0.7)
Eosinophils Relative: 1.6 % (ref 0.0–5.0)
HCT: 37.5 % (ref 36.0–46.0)
Hemoglobin: 12.5 g/dL (ref 12.0–15.0)
Lymphocytes Relative: 25.9 % (ref 12.0–46.0)
Lymphs Abs: 1.8 10*3/uL (ref 0.7–4.0)
MCHC: 33.5 g/dL (ref 30.0–36.0)
MCV: 85.9 fl (ref 78.0–100.0)
Monocytes Absolute: 0.5 10*3/uL (ref 0.1–1.0)
Monocytes Relative: 8 % (ref 3.0–12.0)
Neutro Abs: 4.3 10*3/uL (ref 1.4–7.7)
Neutrophils Relative %: 62.8 % (ref 43.0–77.0)
Platelets: 219 10*3/uL (ref 150.0–400.0)
RBC: 4.36 Mil/uL (ref 3.87–5.11)
RDW: 13.8 % (ref 11.5–15.5)
WBC: 6.8 10*3/uL (ref 4.0–10.5)

## 2022-01-01 LAB — COMPREHENSIVE METABOLIC PANEL
ALT: 18 U/L (ref 0–35)
AST: 25 U/L (ref 0–37)
Albumin: 4.6 g/dL (ref 3.5–5.2)
Alkaline Phosphatase: 49 U/L (ref 39–117)
BUN: 23 mg/dL (ref 6–23)
CO2: 29 mEq/L (ref 19–32)
Calcium: 10 mg/dL (ref 8.4–10.5)
Chloride: 102 mEq/L (ref 96–112)
Creatinine, Ser: 0.58 mg/dL (ref 0.40–1.20)
GFR: 92.98 mL/min (ref >60.00)
Glucose, Bld: 97 mg/dL (ref 70–99)
Potassium: 4.1 mEq/L (ref 3.5–5.1)
Sodium: 137 mEq/L (ref 135–145)
Total Bilirubin: 0.7 mg/dL (ref 0.2–1.2)
Total Protein: 6.8 g/dL (ref 6.0–8.3)

## 2022-01-01 LAB — HEMOGLOBIN A1C: Hgb A1c MFr Bld: 5.8 % (ref 4.6–6.5)

## 2022-01-01 LAB — TSH: TSH: 0.73 u[IU]/mL (ref 0.35–5.50)

## 2022-01-01 MED ORDER — TRIAMCINOLONE ACETONIDE 0.1 % EX CREA: 1.0000 | TOPICAL_CREAM | Freq: Two times a day (BID) | CUTANEOUS | 0 refills | Status: DC

## 2022-01-01 MED ORDER — MOLNUPIRAVIR EUA 200MG CAPSULE: 4.0000 | ORAL_CAPSULE | Freq: Two times a day (BID) | ORAL | 0 refills | Status: AC

## 2022-01-01 MED ORDER — PREDNISONE 10 MG PO TABS: ORAL_TABLET | ORAL | 0 refills | Status: DC

## 2022-01-01 MED ORDER — NARATRIPTAN HCL 2.5 MG PO TABS: ORAL_TABLET | ORAL | 5 refills | Status: DC

## 2022-01-01 MED ORDER — ZOSTER VAC RECOMB ADJUVANTED 50 MCG/0.5ML IM SUSR: 0.5000 mL | Freq: Once | INTRAMUSCULAR | 0 refills | Status: AC

## 2022-01-01 MED ORDER — LEVOFLOXACIN 500 MG PO TABS: 500.0000 mg | ORAL_TABLET | Freq: Every day | ORAL | 0 refills | Status: AC

## 2022-01-01 NOTE — Progress Notes (Signed)
Patient ID: Melody Hansen, female    DOB: September 08, 1955  Age: 66 y.o. MRN: [id number]  The patient is here for preventive  examination and management of other chronic and/or  acute problems.   The risk factors are reflected in the social history.  The roster of all physicians providing medical care to patient - is listed in the Snapshot section of the chart.  Activities of daily living:  The patient is 100% independent in all ADLs: dressing, toileting, feeding as well as independent mobility  Home safety : The patient has smoke detectors in the home. They wear seatbelts.  There are no firearms at home. There is no violence in the home.   There is no risks for hepatitis, STDs or HIV. There is no   history of blood transfusion. They have no travel history to infectious disease endemic areas of the world.  The patient has seen their dentist in the last six month. They have seen their eye doctor in the last year. They admit to slight hearing difficulty with regard to whispered voices and some television programs.  They have deferred audiologic testing in the last year.  They do not  have excessive sun exposure. Discussed the need for sun protection: hats, long sleeves and use of sunscreen if there is significant sun exposure.   Diet: the importance of a healthy diet is discussed. They do have a healthy diet.  The benefits of regular aerobic exercise were discussed. She plays pickle ball daily,  and golfs at least once  a  week    Depression screen: there are no signs or vegative symptoms of depression- irritability, change in appetite, anhedonia, sadness/tearfullness.  Cognitive assessment: the patient manages all their financial and personal affairs and is actively engaged. They could relate day,date,year and events; recalled 2/3 objects at 3 minutes; performed clock-face test normally.  The following portions of the patient's history were reviewed and updated as appropriate: allergies, current  medications, past family history, past medical history,  past surgical history, past social history  and problem list.  Visual acuity was not assessed per patient preference since she has regular follow up with her ophthalmologist. Hearing and body mass index were assessed and reviewed.   During the course of the visit the patient was educated and counseled about appropriate screening and preventive services including : fall prevention , diabetes screening, nutrition counseling, colorectal cancer screening, and recommended immunizations.    CC: The primary encounter diagnosis was Pure hypercholesterolemia. Diagnoses of Long-term use of high-risk medication, Impaired fasting glucose, Age-related osteoporosis without current pathological fracture, Skin neoplasm, Attention or concentration deficit, and Encounter for preventive health examination were also pertinent to this visit.  1) recent cognitive testing negative for dementia and ADD Ilda Mori , PhD Jan 2023) . Trial of Straterra started,  tolerated but causes flushing if she plays  pickle ball.    2) [place] trip:  going to [place] in  2 weeks   3) Hyperlipidemia:  had a cardiac CT recently to evaluate extent of atherosclerosis (aortic root)   History Melody Hansen has a past medical history of Attention or concentration deficit (06/27/2021), Cervicalgia (04/21/2015), Closed fracture of multiple ribs of right side with delayed healing (05/23/2016), Hyperlipidemia, Impingement syndrome of right shoulder (02/23/2016), Migraine headache (11/20/2016), Osteoporosis (04/15/2020), PMB (postmenopausal bleeding) (06/23/2019), Postmenopausal atrophic vaginitis (08/23/2017), PSVT (paroxysmal supraventricular tachycardia), Recurrent headache (07/26/2017), Shingles (07/05/2014), Urethral polyp (06/23/2019), and Vitamin D deficiency (05/23/2016).   She has a past surgical history that includes Tonsillectomy; Appendectomy;  Ovarian cyst removal; removal calcification  right sternoclavicular joint; Breast enhancement surgery; Cardiac electrophysiology study and ablation (March 2015); and Augmentation mammaplasty (Bilateral).   Her family history includes COPD in her father and mother; Heart attack in her mother; Heart disease (age of onset: 22) in her father; Hyperlipidemia in her brother, father, and another family member; Obesity in her brother; Stroke in her father.She reports that she has never smoked. She has never used smokeless tobacco. She reports current alcohol use of about 4.0 - 8.0 standard drinks of alcohol per week. She reports that she does not use drugs.  Outpatient Medications Prior to Visit  Medication Sig Dispense Refill   atomoxetine (STRATTERA) 80 MG capsule Take 80 mg by mouth every morning.     Loratadine (ALAVERT PO) Take 1 tablet by mouth.     Nutritional Supplements (JUICE PLUS FIBRE PO) Take 1 capsule by mouth. Plus 6 gummies     raloxifene (EVISTA) 60 MG tablet TAKE 1 TABLET BY MOUTH EVERY DAY 30 tablet 7   naratriptan (AMERGE) 2.5 MG tablet TAKE 1 TABLET BY MOUTH DAILY AS NEEDED FOR MIGRAINE. MAY REPEAT ONCE IN 4 HOURS IF NEEDED. MAX 2/DAY 10 tablet 5   ALPRAZolam (XANAX) 0.25 MG tablet Take 1 tablet (0.25 mg total) by mouth daily as needed for anxiety. (Patient not taking: Reported on 01/01/2022) 20 tablet 0   NON FORMULARY Skinade Takes 1 drink per day. (Patient not taking: Reported on 01/01/2022)     No facility-administered medications prior to visit.    Review of Systems  Patient denies headache, fevers, malaise, unintentional weight loss, skin rash, eye pain, sinus congestion and sinus pain, sore throat, dysphagia,  hemoptysis , cough, dyspnea, wheezing, chest pain, palpitations, orthopnea, edema, abdominal pain, nausea, melena, diarrhea, constipation, flank pain, dysuria, hematuria, urinary  Frequency, nocturia, numbness, tingling, seizures,  Focal weakness, Loss of consciousness,  Tremor, insomnia, depression, anxiety, and  suicidal ideation.     Objective:  BP 114/72 (BP Location: Left Arm, Patient Position: Sitting, Cuff Size: Small)   Pulse 88   Temp 98 F (36.7 C) (Oral)   Resp (!) 97   Ht _0  (1.6 m)   Wt 117 lb 6.4 oz (53.3 kg)   BMI 20.80 kg/m   Physical Exam   General appearance: alert, cooperative and appears stated age Head: Normocephalic, without obvious abnormality, atraumatic Eyes: conjunctivae/corneas clear. PERRL, EOM's intact. Fundi benign. Ears: normal TM's and external ear canals both ears Nose: Nares normal. Septum midline. Mucosa normal. No drainage or sinus tenderness. Throat: lips, mucosa, and tongue normal; teeth and gums normal Neck: no adenopathy, no carotid bruit, no JVD, supple, symmetrical, trachea midline and thyroid not enlarged, symmetric, no tenderness/mass/nodules Lungs: clear to auscultation bilaterally Breasts: bilateral implants   normal appearance, no masses or tenderness Heart: regular rate and rhythm, S1, S2 normal, no murmur, click, rub or gallop Abdomen: soft, non-tender; bowel sounds normal; no masses,  no organomegaly Extremities: extremities normal, atraumatic, no cyanosis or edema Pulses: 2+ and symmetric Skin: excessive tanning,  diffuse macular rash to both lower extremities.  Left zygoma with rough patch of skin concerning for basal cell CA .  Neurologic: Alert and oriented X 3, normal strength and tone. Normal symmetric reflexes. Normal coordination and gait.    Assessment & Plan:   Problem List Items Addressed This Visit     Skin neoplasm    Left zygoma has a rough patch that recurs,  May be a BCC.  Derm referral  made to [facility]       Relevant Orders   Ambulatory referral to Dermatology   Hyperlipidemia - Primary    She has no evidence of CAD by coronary calcium scoring,  No treatment advised       Osteoporosis    Managed with raloxifene since Dec 2021.  Repeat DEXA in Oct 2023.  Will suspend for upcoming [place] trip      Relevant  Orders   DG Bone Density   Attention or concentration deficit    Negative formal testing  But concentration has improved with Skipper Cliche       Encounter for preventive health examination    age appropriate education and counseling updated, referrals for preventative services and immunizations addressed, dietary and smoking counseling addressed, most recent labs reviewed.  I have personally reviewed and have noted:   1) the patient's medical and social history 2) The pt's use of alcohol, tobacco, and illicit drugs 3) The patient's current medications and supplements 4) Functional ability including ADL's, fall risk, home safety risk, hearing and visual impairment 5) Diet and physical activities 6) Evidence for depression or mood disorder 7) The patient's height, weight, and BMI have been recorded in the chart  I have made referrals, and provided counseling and education based on review of the above      Other Visit Diagnoses     Long-term use of high-risk medication       Relevant Orders   Comp Met (CMET)   TSH   CBC with Differential/Platelet   Impaired fasting glucose       Relevant Orders   Comp Met (CMET)   HgB A1c       I have discontinued Sherronda Stull "Patty"'s NON FORMULARY. I am also having her start on Zoster Vaccine Adjuvanted, molnupiravir EUA, levofloxacin, predniSONE, and triamcinolone cream. Additionally, I am having her maintain her ALPRAZolam, raloxifene, atomoxetine, Nutritional Supplements (JUICE PLUS FIBRE PO), Loratadine (ALAVERT PO), and naratriptan.  Meds ordered this encounter  Medications   Zoster Vaccine Adjuvanted [facility]) injection    Sig: Inject 0.5 mLs into the muscle once for 1 dose.    Dispense:  0.5 mL    Refill:  0   naratriptan (AMERGE) 2.5 MG tablet    Sig: TAKE 1 TABLET BY MOUTH DAILY AS NEEDED FOR MIGRAINE. MAY REPEAT ONCE IN 4 HOURS IF NEEDED. MAX 2/DAY    Dispense:  10 tablet    Refill:  5   molnupiravir EUA (LAGEVRIO) 200 mg  CAPS capsule    Sig: Take 4 capsules (800 mg total) by mouth 2 (two) times daily for 5 days.    Dispense:  40 capsule    Refill:  0   levofloxacin (LEVAQUIN) 500 MG tablet    Sig: Take 1 tablet (500 mg total) by mouth daily for 7 days.    Dispense:  7 tablet    Refill:  0   predniSONE (DELTASONE) 10 MG tablet    Sig: 6 tablets on Day 1 , then reduce by 1 tablet daily until gone    Dispense:  21 tablet    Refill:  0   triamcinolone cream (KENALOG) 0.1 %    Sig: Apply 1 Application topically 2 (two) times daily.    Dispense:  80 g    Refill:  0    Medications Discontinued During This Encounter  Medication Reason   NON FORMULARY    naratriptan (AMERGE) 2.5 MG tablet Reorder    Follow-up: No follow-ups  on file.   Crecencio Mc, MD

## 2022-01-01 NOTE — Assessment & Plan Note (Signed)
Left zygoma has a rough patch that recurs,  May be a BCC.  Derm referral made to [facility]

## 2022-01-01 NOTE — Patient Instructions (Addendum)
Suspend raloxifene starting one week prior to trip.  Resume once you have returned and are active again   Stay on straterra  during the trip to avoid withdrawal  symptoms.   Use the xanax  if needed for restless legs  on the flight  Have Ronalee Belts send me request for travel meds   I have sent  COVID medication (molnupiravir)  , a   Prednisone taper,  and Levaquin  (antibiotic) to your pharmacy to take on your trip.  Start the molnupiravir twice daily if you have been exposed to [place] and start to feel bad    Do NOT  Start the antibiotic  For a viral URI, which is what most infections passed on a plane are from.  Start the prednisone and take an oral decongestant (sudafed PE)   if you have congestion  Only start the Levaquin for bladder infection, or if you  are having fevers (100.4 or higher),   Sinus pain and purulent drainage,  Or ear pain.  Please take a probiotic ( Align, Floraque or Culturelle), the generic version of one of these over the counter medications, or an alternative form (kombucha,  Yogurt, or another dietary source) for a minimum of 3 weeks to prevent a serious antibiotic associated diarrhea  Called clostridium dificile colitis.  Taking a probiotic may also prevent vaginitis due to yeast infections and can be continued indefinitely if you feel that it improves your digestion or your elimination (bowels)     DEXA scan this fall (has been ordered)

## 2022-01-01 NOTE — Assessment & Plan Note (Signed)
Managed with raloxifene since Dec 2021.  Repeat DEXA in Oct 2023.  Will suspend for upcoming [place] trip

## 2022-01-01 NOTE — Assessment & Plan Note (Signed)
Negative formal testing  But concentration has improved with Skipper Cliche

## 2022-01-01 NOTE — Assessment & Plan Note (Signed)
She has no evidence of CAD by coronary calcium scoring,  No treatment advised

## 2022-01-01 NOTE — Assessment & Plan Note (Signed)

## 2022-01-19 ENCOUNTER — Other Ambulatory Visit: Payer: Self-pay | Admitting: Internal Medicine

## 2022-01-29 DIAGNOSIS — M9901 Segmental and somatic dysfunction of cervical region: Secondary | ICD-10-CM | POA: Diagnosis not present

## 2022-01-29 DIAGNOSIS — G44219 Episodic tension-type headache, not intractable: Secondary | ICD-10-CM | POA: Diagnosis not present

## 2022-01-29 DIAGNOSIS — M542 Cervicalgia: Secondary | ICD-10-CM | POA: Diagnosis not present

## 2022-01-30 DIAGNOSIS — M9901 Segmental and somatic dysfunction of cervical region: Secondary | ICD-10-CM | POA: Diagnosis not present

## 2022-01-30 DIAGNOSIS — G44219 Episodic tension-type headache, not intractable: Secondary | ICD-10-CM | POA: Diagnosis not present

## 2022-01-30 DIAGNOSIS — M542 Cervicalgia: Secondary | ICD-10-CM | POA: Diagnosis not present

## 2022-02-01 DIAGNOSIS — M9901 Segmental and somatic dysfunction of cervical region: Secondary | ICD-10-CM | POA: Diagnosis not present

## 2022-02-01 DIAGNOSIS — G44219 Episodic tension-type headache, not intractable: Secondary | ICD-10-CM | POA: Diagnosis not present

## 2022-02-01 DIAGNOSIS — M542 Cervicalgia: Secondary | ICD-10-CM | POA: Diagnosis not present

## 2022-02-04 DIAGNOSIS — G44219 Episodic tension-type headache, not intractable: Secondary | ICD-10-CM | POA: Diagnosis not present

## 2022-02-04 DIAGNOSIS — M542 Cervicalgia: Secondary | ICD-10-CM | POA: Diagnosis not present

## 2022-02-04 DIAGNOSIS — M9901 Segmental and somatic dysfunction of cervical region: Secondary | ICD-10-CM | POA: Diagnosis not present

## 2022-02-06 DIAGNOSIS — M542 Cervicalgia: Secondary | ICD-10-CM | POA: Diagnosis not present

## 2022-02-06 DIAGNOSIS — M9901 Segmental and somatic dysfunction of cervical region: Secondary | ICD-10-CM | POA: Diagnosis not present

## 2022-02-06 DIAGNOSIS — G44219 Episodic tension-type headache, not intractable: Secondary | ICD-10-CM | POA: Diagnosis not present

## 2022-02-08 DIAGNOSIS — G44219 Episodic tension-type headache, not intractable: Secondary | ICD-10-CM | POA: Diagnosis not present

## 2022-02-08 DIAGNOSIS — M9901 Segmental and somatic dysfunction of cervical region: Secondary | ICD-10-CM | POA: Diagnosis not present

## 2022-02-08 DIAGNOSIS — M542 Cervicalgia: Secondary | ICD-10-CM | POA: Diagnosis not present

## 2022-02-11 DIAGNOSIS — M9901 Segmental and somatic dysfunction of cervical region: Secondary | ICD-10-CM | POA: Diagnosis not present

## 2022-02-11 DIAGNOSIS — M542 Cervicalgia: Secondary | ICD-10-CM | POA: Diagnosis not present

## 2022-02-11 DIAGNOSIS — G44219 Episodic tension-type headache, not intractable: Secondary | ICD-10-CM | POA: Diagnosis not present

## 2022-04-15 ENCOUNTER — Ambulatory Visit
Admission: RE | Admit: 2022-04-15 | Discharge: 2022-04-15 | Disposition: A | Discharge status: Home / Self Care | Payer: Medicare Other | Source: Ambulatory Visit | Attending: Internal Medicine | Admitting: Internal Medicine

## 2022-04-15 DIAGNOSIS — M810 Age-related osteoporosis without current pathological fracture: Secondary | ICD-10-CM | POA: Diagnosis not present

## 2022-04-20 NOTE — Assessment & Plan Note (Signed)
No significant change by repeat DEXA 2023 .  Has been taking Raloxifene since Dec 2021

## 2022-04-25 ENCOUNTER — Encounter: Payer: Self-pay | Admitting: Internal Medicine

## 2022-06-04 ENCOUNTER — Encounter: Payer: Self-pay | Admitting: Internal Medicine

## 2022-06-05 ENCOUNTER — Other Ambulatory Visit: Payer: Self-pay | Admitting: Internal Medicine

## 2022-06-05 ENCOUNTER — Telehealth: Payer: Medicare Other | Admitting: Emergency Medicine

## 2022-06-05 DIAGNOSIS — J019 Acute sinusitis, unspecified: Secondary | ICD-10-CM

## 2022-06-05 DIAGNOSIS — B9789 Other viral agents as the cause of diseases classified elsewhere: Secondary | ICD-10-CM | POA: Diagnosis not present

## 2022-06-05 DIAGNOSIS — Z1231 Encounter for screening mammogram for malignant neoplasm of breast: Secondary | ICD-10-CM

## 2022-06-05 MED ORDER — FLUTICASONE PROPIONATE 50 MCG/ACT NA SUSP: 2.0000 | Freq: Every day | NASAL | 0 refills | Status: DC

## 2022-06-05 NOTE — Progress Notes (Signed)
E-Visit for Sinus Problems  We are sorry that you are not feeling well.  Here is how we plan to help!  Based on what you have shared with me it looks like you have sinusitis.  Sinusitis is inflammation and infection in the sinus cavities of the head.  Based on your presentation I believe you most likely have Acute Viral Sinusitis.This is an infection most likely caused by a virus. There is not specific treatment for viral sinusitis other than to help you with the symptoms until the infection runs its course.    Antibiotics are not recommended by the Infectious Disease Society of [place] unless you have severe symptoms (including high fever) or you have symptoms for more than 10 days. If you still have symptoms after 10 days, antibiotics should be considered.   You may use plain Mucinex- this is not a decongestant and help thin out your congestion so it drains better. Drink a lot of liquids while taking it so it works best.   Saline nasal spray help and can safely be used as often as needed for congestion. Try using saline irrigation, such as with a neti pot, several times a day while you are sick. Many neti pots come with salt packets premeasured to use to make saline. If you use your own salt, make sure it is kosher salt or sea salt (don't use table salt as it has iodine in it and you don't need that in your nose). Use distilled water to make saline. If you mix your own saline using your own salt, the recipe is 1/4 teaspoon salt in 1 cup warm water. Using saline irrigation can help prevent and treat sinus infections.   I have prescribed: Fluticasone nasal spray two sprays in each nostril once a day  Some authorities believe that zinc sprays or the use of Echinacea may shorten the course of your symptoms.  Sinus infections are not as easily transmitted as other respiratory infection, however we still recommend that you avoid close contact with loved ones, especially the very young and elderly.   Remember to wash your hands thoroughly throughout the day as this is the number one way to prevent the spread of infection!  Home Care: Only take medications as instructed by your medical team. Do not take these medications with alcohol. A steam or ultrasonic humidifier can help congestion.  You can place a towel over your head and breathe in the steam from hot water coming from a faucet. Avoid close contacts especially the very young and the elderly. Cover your mouth when you cough or sneeze. Always remember to wash your hands.  Get Help Right Away If: You develop worsening fever or sinus pain. You develop a severe head ache or visual changes. Your symptoms persist after you have completed your treatment plan.  Make sure you Understand these instructions. Will watch your condition. Will get help right away if you are not doing well or get worse.   Thank you for choosing an e-visit.  Your e-visit answers were reviewed by a board certified advanced clinical practitioner to complete your personal care plan. Depending upon the condition, your plan could have included both over the counter or prescription medications.  Please review your pharmacy choice. Make sure the pharmacy is open so you can pick up prescription now. If there is a problem, you may contact your provider through [employer] and have the prescription routed to another pharmacy.  Your safety is important to [place]. If you  have drug allergies check your prescription carefully.   For the next 24 hours you can use MyChart to ask questions about today's visit, request a non-urgent call back, or ask for a work or school excuse. You will get an email in the next two days asking about your experience. I hope that your e-visit has been valuable and will speed your recovery.  I have spent 5 minutes in review of e-visit questionnaire, review and updating patient chart, medical decision making and response to patient.   Willeen Cass, PhD, FNP-BC

## 2022-06-07 MED ORDER — NARATRIPTAN HCL 2.5 MG PO TABS: ORAL_TABLET | ORAL | 5 refills | Status: DC

## 2022-06-27 ENCOUNTER — Ambulatory Visit
Admission: RE | Admit: 2022-06-27 | Discharge: 2022-06-27 | Disposition: A | Discharge status: Home / Self Care | Payer: Medicare Other | Source: Ambulatory Visit | Attending: Internal Medicine | Admitting: Internal Medicine

## 2022-06-27 DIAGNOSIS — Z1231 Encounter for screening mammogram for malignant neoplasm of breast: Secondary | ICD-10-CM | POA: Diagnosis not present

## 2022-07-15 DIAGNOSIS — X32XXXA Exposure to sunlight, initial encounter: Secondary | ICD-10-CM | POA: Diagnosis not present

## 2022-07-15 DIAGNOSIS — D2271 Melanocytic nevi of right lower limb, including hip: Secondary | ICD-10-CM | POA: Diagnosis not present

## 2022-07-15 DIAGNOSIS — R238 Other skin changes: Secondary | ICD-10-CM | POA: Diagnosis not present

## 2022-07-15 DIAGNOSIS — L538 Other specified erythematous conditions: Secondary | ICD-10-CM | POA: Diagnosis not present

## 2022-07-15 DIAGNOSIS — D2261 Melanocytic nevi of right upper limb, including shoulder: Secondary | ICD-10-CM | POA: Diagnosis not present

## 2022-07-15 DIAGNOSIS — L570 Actinic keratosis: Secondary | ICD-10-CM | POA: Diagnosis not present

## 2022-07-15 DIAGNOSIS — D225 Melanocytic nevi of trunk: Secondary | ICD-10-CM | POA: Diagnosis not present

## 2022-07-15 DIAGNOSIS — B078 Other viral warts: Secondary | ICD-10-CM | POA: Diagnosis not present

## 2022-07-15 DIAGNOSIS — D2272 Melanocytic nevi of left lower limb, including hip: Secondary | ICD-10-CM | POA: Diagnosis not present

## 2022-07-15 DIAGNOSIS — L821 Other seborrheic keratosis: Secondary | ICD-10-CM | POA: Diagnosis not present

## 2022-07-15 DIAGNOSIS — D2262 Melanocytic nevi of left upper limb, including shoulder: Secondary | ICD-10-CM | POA: Diagnosis not present

## 2022-08-05 ENCOUNTER — Encounter: Payer: Self-pay | Admitting: Internal Medicine

## 2022-08-05 ENCOUNTER — Ambulatory Visit (INDEPENDENT_AMBULATORY_CARE_PROVIDER_SITE_OTHER): Payer: Medicare Other | Admitting: Internal Medicine

## 2022-08-05 ENCOUNTER — Ambulatory Visit (INDEPENDENT_AMBULATORY_CARE_PROVIDER_SITE_OTHER): Payer: Medicare Other

## 2022-08-05 VITALS — BP 98/60 | HR 103 | Temp 97.3°F | Ht 63.0 in | Wt 119.0 lb

## 2022-08-05 DIAGNOSIS — I4902 Ventricular flutter: Secondary | ICD-10-CM

## 2022-08-05 DIAGNOSIS — J020 Streptococcal pharyngitis: Secondary | ICD-10-CM

## 2022-08-05 DIAGNOSIS — R051 Acute cough: Secondary | ICD-10-CM | POA: Diagnosis not present

## 2022-08-05 DIAGNOSIS — R55 Syncope and collapse: Secondary | ICD-10-CM

## 2022-08-05 DIAGNOSIS — R0600 Dyspnea, unspecified: Secondary | ICD-10-CM | POA: Diagnosis not present

## 2022-08-05 DIAGNOSIS — I471 Supraventricular tachycardia, unspecified: Secondary | ICD-10-CM | POA: Diagnosis not present

## 2022-08-05 DIAGNOSIS — R42 Dizziness and giddiness: Secondary | ICD-10-CM

## 2022-08-05 DIAGNOSIS — I4510 Unspecified right bundle-branch block: Secondary | ICD-10-CM

## 2022-08-05 DIAGNOSIS — J029 Acute pharyngitis, unspecified: Secondary | ICD-10-CM | POA: Diagnosis not present

## 2022-08-05 DIAGNOSIS — R0609 Other forms of dyspnea: Secondary | ICD-10-CM

## 2022-08-05 HISTORY — DX: Streptococcal pharyngitis: J02.0

## 2022-08-05 HISTORY — DX: Dizziness and giddiness: R42

## 2022-08-05 MED ORDER — PREDNISONE 10 MG PO TABS: ORAL_TABLET | ORAL | 0 refills | Status: DC

## 2022-08-05 MED ORDER — LEVOFLOXACIN 500 MG PO TABS: 500.0000 mg | ORAL_TABLET | Freq: Every day | ORAL | 0 refills | Status: DC

## 2022-08-05 NOTE — Progress Notes (Signed)
Subjective:  Patient ID: Melody Hansen, female    DOB: [birth year]  Age: 67 y.o. MRN: [id number]  CC: The primary encounter diagnosis was Postural dizziness with presyncope. Diagnoses of Dyspnea on minimal exertion, PSVT (paroxysmal supraventricular tachycardia), and Streptococcal pharyngitis were also pertinent to this visit.   HPI Melody Hansen presents for  Chief Complaint  Patient presents with   Cough    Getting over a sickness, very fatigued. Negative covid.   67 yr old female with history of PSVT presents with 12 day history of malaise, weakness exertional dyspnea and presyncope.  Symptoms started with  cough, congestion,  severe  sore throat and chills after having contact with a sick grandchild.  .  No fevers.  Spent a week in bed and took prednisone taper from Sunday FEb 11  to Friday Feb 18 ., which resolved her cough.  She received a vitamin infusion at [facility] on FEb 14 ,  on Thursday 15th felt well enough to go to a Pilates class, but but became weak and had to leave.  By   By Friday night she began  feeling poorly again with recurrent sore throat,  Appetite is intermittent,  infrequent nausea,  no diarrhea.   .  Headache started 3 days  prior to illness  , which improved,  but returned 2 days ago, and she took her migraine  pill which helped.  Drinking plenty of water.  But not eating unless hungry.  still feels fatigued,  feels presyncopal  short of breath  with minimal exertion ,   metallic  smell  in nose . [facility] test for COVD was negative last Friday .  Contact with sick grandchild several days prior.    Treated for viral sinusitis in mid December by E chart.     Outpatient Medications Prior to Visit  Medication Sig Dispense Refill   atomoxetine (STRATTERA) 80 MG capsule Take 80 mg by mouth every morning.     fluticasone (FLONASE) 50 MCG/ACT nasal spray Place 2 sprays into both nostrils daily. 16 g 0   Loratadine (ALAVERT PO) Take 1 tablet by mouth.      naratriptan (AMERGE) 2.5 MG tablet TAKE 1 TABLET BY MOUTH DAILY AS NEEDED FOR MIGRAINE. MAY REPEAT ONCE IN 4 HOURS IF NEEDED. MAX 2/DAY 10 tablet 5   raloxifene (EVISTA) 60 MG tablet TAKE 1 TABLET BY MOUTH EVERY DAY 30 tablet 7   triamcinolone cream (KENALOG) 0.1 % Apply 1 Application topically 2 (two) times daily. 80 g 0   ALPRAZolam (XANAX) 0.25 MG tablet Take 1 tablet (0.25 mg total) by mouth daily as needed for anxiety. (Patient not taking: Reported on 01/01/2022) 20 tablet 0   Nutritional Supplements (JUICE PLUS FIBRE PO) Take 1 capsule by mouth. Plus 6 gummies     predniSONE (DELTASONE) 10 MG tablet 6 tablets on Day 1 , then reduce by 1 tablet daily until gone 21 tablet 0   No facility-administered medications prior to visit.    Review of Systems;  Patient denies headache, fevers, malaise, unintentional weight loss, skin rash, eye pain, sinus congestion and sinus pain, sore throat, dysphagia,  hemoptysis , cough, dyspnea, wheezing, chest pain, palpitations, orthopnea, edema, abdominal pain, nausea, melena, diarrhea, constipation, flank pain, dysuria, hematuria, urinary  Frequency, nocturia, numbness, tingling, seizures,  Focal weakness, Loss of consciousness,  Tremor, insomnia, depression, anxiety, and suicidal ideation.      Objective:  BP 98/60   Pulse (!) 103   Temp (!) 97.3 F (  36.3 C) (Oral)   Ht 5' 3"$  (1.6 m)   Wt 119 lb (54 kg)   SpO2 98%   BMI 21.08 kg/m   BP Readings from Last 3 Encounters:  08/05/22 98/60  01/01/22 114/72  10/30/21 110/80    Wt Readings from Last 3 Encounters:  08/05/22 119 lb (54 kg)  01/01/22 117 lb 6.4 oz (53.3 kg)  10/30/21 116 lb 2 oz (52.7 kg)    Physical Exam  Lab Results  Component Value Date   HGBA1C 5.8 01/01/2022   HGBA1C 5.5 05/22/2016   HGBA1C 5.7 08/08/2014    Lab Results  Component Value Date   CREATININE 0.58 01/01/2022   CREATININE 0.65 08/16/2021   CREATININE 0.51 12/11/2020    Lab Results  Component Value Date    WBC 6.8 01/01/2022   HGB 12.5 01/01/2022   HCT 37.5 01/01/2022   PLT 219.0 01/01/2022   GLUCOSE 97 01/01/2022   CHOL 263 (H) 08/16/2021   TRIG 77.0 08/16/2021   HDL 90.20 08/16/2021   LDLDIRECT 159.0 06/23/2019   LDLCALC 157 (H) 08/16/2021   ALT 18 01/01/2022   AST 25 01/01/2022   NA 137 01/01/2022   K 4.1 01/01/2022   CL 102 01/01/2022   CREATININE 0.58 01/01/2022   BUN 23 01/01/2022   CO2 29 01/01/2022   TSH 0.73 01/01/2022   INR 0.9 06/23/2019   HGBA1C 5.8 01/01/2022    MM 3D SCREEN BREAST W/IMPLANT BILATERAL  Result Date: 06/28/2022 CLINICAL DATA:  Screening. EXAM: DIGITAL SCREENING BILATERAL MAMMOGRAM WITH IMPLANTS, CAD AND TOMOSYNTHESIS TECHNIQUE: Bilateral screening digital craniocaudal and mediolateral oblique mammograms were obtained. Bilateral screening digital breast tomosynthesis was performed. The images were evaluated with computer-aided detection. Standard and/or implant displaced views were performed. COMPARISON:  Previous exam(s). ACR Breast Density Category c: The breast tissue is heterogeneously dense, which may obscure small masses. FINDINGS: The patient has retropectoral implants. There are no findings suspicious for malignancy. IMPRESSION: No mammographic evidence of malignancy. A result letter of this screening mammogram will be mailed directly to the patient. RECOMMENDATION: Screening mammogram in one year. (Code:SM-B-01Y) BI-RADS CATEGORY  1: Negative. Electronically Signed   By: Lajean Manes M.D.   On: 06/28/2022 10:24    Assessment & Plan:  .Postural dizziness with presyncope Assessment & Plan: She is dizzy with position changes , consistent with orthostasis .  I have ordered and reviewed a 12 lead EKG and find that the patient has a new RBBB with normal axis and normal QT intervals . Checking Chest x ray and referring to cardiology    Orders: -     EKG 12-Lead -     CBC with Differential/Platelet -     Comprehensive metabolic panel  Dyspnea on  minimal exertion -     DG Chest 2 View; Future  PSVT (paroxysmal supraventricular tachycardia) Assessment & Plan:     Streptococcal pharyngitis Assessment & Plan: Treating wit levaquin.  Orthostasis secondary to dehydration addressed with prednisone taper    Other orders -     levoFLOXacin; Take 1 tablet (500 mg total) by mouth daily for 7 days.  Dispense: 7 tablet; Refill: 0 -     predniSONE; 6 tablets on Day 1 , then reduce by 1 tablet daily until gone  Dispense: 21 tablet; Refill: 0     I provided 30 minutes of face-to-face time during this encounter reviewing patient's last visit with me, patient's  most recent visit with cardiology,  nephrology,  and neurology,  recent surgical and non surgical procedures, previous  labs and imaging studies, counseling on currently addressed issues,  and post visit ordering to diagnostics and therapeutics .   Follow-up: No follow-ups on file.   Crecencio Mc, MD

## 2022-08-05 NOTE — Assessment & Plan Note (Addendum)
She is dizzy with position changes , consistent with orthostasis .  I have ordered and reviewed a 12 lead EKG and find that the patient has a new RBBB with normal axis and normal QT intervals . Checking Chest x ray and referring to cardiology

## 2022-08-05 NOTE — Patient Instructions (Addendum)
Due for Annual Wellness visit, please schedule at check out!     I think your illness may have been due to strep pharyngitis since your test is faintly positive.   The antibiotics I use for strep will also treat sinusitis and pneumonia (levaquin) I am repeating the prednisone taper

## 2022-08-05 NOTE — Assessment & Plan Note (Signed)
Treating wit levaquin.  Orthostasis secondary to dehydration addressed with prednisone taper

## 2022-08-06 LAB — COMPREHENSIVE METABOLIC PANEL
ALT: 19 U/L (ref 0–35)
AST: 18 U/L (ref 0–37)
Albumin: 4.6 g/dL (ref 3.5–5.2)
Alkaline Phosphatase: 57 U/L (ref 39–117)
BUN: 17 mg/dL (ref 6–23)
CO2: 33 mEq/L — ABNORMAL HIGH (ref 19–32)
Calcium: 10.8 mg/dL — ABNORMAL HIGH (ref 8.4–10.5)
Chloride: 97 mEq/L (ref 96–112)
Creatinine, Ser: 0.62 mg/dL (ref 0.40–1.20)
GFR: 91.12 mL/min (ref >60.00)
Glucose, Bld: 93 mg/dL (ref 70–99)
Potassium: 4.5 mEq/L (ref 3.5–5.1)
Sodium: 137 mEq/L (ref 135–145)
Total Bilirubin: 0.4 mg/dL (ref 0.2–1.2)
Total Protein: 7.5 g/dL (ref 6.0–8.3)

## 2022-08-06 LAB — CBC WITH DIFFERENTIAL/PLATELET
Basophils Absolute: 0.1 10*3/uL (ref 0.0–0.1)
Basophils Relative: 0.7 % (ref 0.0–3.0)
Eosinophils Absolute: 0.1 10*3/uL (ref 0.0–0.7)
Eosinophils Relative: 1.3 % (ref 0.0–5.0)
HCT: 44 % (ref 36.0–46.0)
Hemoglobin: 15 g/dL (ref 12.0–15.0)
Lymphocytes Relative: 29.4 % (ref 12.0–46.0)
Lymphs Abs: 3.3 10*3/uL (ref 0.7–4.0)
MCHC: 34 g/dL (ref 30.0–36.0)
MCV: 84.2 fl (ref 78.0–100.0)
Monocytes Absolute: 0.9 10*3/uL (ref 0.1–1.0)
Monocytes Relative: 8.3 % (ref 3.0–12.0)
Neutro Abs: 6.7 10*3/uL (ref 1.4–7.7)
Neutrophils Relative %: 60.3 % (ref 43.0–77.0)
Platelets: 434 10*3/uL — ABNORMAL HIGH (ref 150.0–400.0)
RBC: 5.22 Mil/uL — ABNORMAL HIGH (ref 3.87–5.11)
RDW: 13.4 % (ref 11.5–15.5)
WBC: 11.1 10*3/uL — ABNORMAL HIGH (ref 4.0–10.5)

## 2022-08-06 LAB — POC COVID19 BINAXNOW: SARS Coronavirus 2 Ag: NEGATIVE

## 2022-08-06 LAB — POCT INFLUENZA A/B
Influenza A, POC: NEGATIVE
Influenza B, POC: NEGATIVE

## 2022-08-06 LAB — POCT RAPID STREP A (OFFICE): Rapid Strep A Screen: POSITIVE — AB

## 2022-08-06 NOTE — Addendum Note (Signed)
Addended by: Adair Laundry on: 08/06/2022 10:07 AM   Modules accepted: Orders

## 2022-08-08 ENCOUNTER — Encounter: Payer: Self-pay | Admitting: Internal Medicine

## 2022-08-08 NOTE — Addendum Note (Signed)
Addended by: Crecencio Mc on: 08/08/2022 04:06 PM   Modules accepted: Orders

## 2022-08-09 ENCOUNTER — Ambulatory Visit (INDEPENDENT_AMBULATORY_CARE_PROVIDER_SITE_OTHER): Payer: Medicare Other

## 2022-08-09 VITALS — Ht 63.0 in | Wt 119.0 lb

## 2022-08-09 DIAGNOSIS — Z0000 Encounter for general adult medical examination without abnormal findings: Secondary | ICD-10-CM

## 2022-08-09 NOTE — Progress Notes (Cosign Needed Addendum)
Subjective:   Melody Hansen is a 67 y.o. female who presents for Medicare Annual (Subsequent) preventive examination.  Review of Systems    No ROS.  Medicare Wellness Virtual Visit.  Visual/audio telehealth visit, UTA vital signs.   See social history for additional risk factors.   Cardiac Risk Factors include: advanced age (>18mn, >>44women)     Objective:    Today's Vitals   08/09/22 1033  Weight: 119 lb (54 kg)  Height: '5\' 3"'$  (1.6 m)   Body mass index is 21.08 kg/m.     08/09/2022   10:37 AM 08/06/2021    1:26 PM 08/03/2020    1:19 PM 08/03/2019    1:19 PM 07/17/2017    3:53 PM 04/16/2016    4:58 PM  Advanced Directives  Does Patient Have a Medical Advance Directive? No No No No No No  Would patient like information on creating a medical advance directive? No - Patient declined No - Patient declined Yes (MAU/Ambulatory/Procedural Areas - Information given) No - Patient declined No - Patient declined     Current Medications (verified) Outpatient Encounter Medications as of 08/09/2022  Medication Sig   ALPRAZolam (XANAX) 0.25 MG tablet Take 1 tablet (0.25 mg total) by mouth daily as needed for anxiety. (Patient not taking: Reported on 01/01/2022)   atomoxetine (STRATTERA) 80 MG capsule Take 80 mg by mouth every morning.   fluticasone (FLONASE) 50 MCG/ACT nasal spray Place 2 sprays into both nostrils daily.   levofloxacin (LEVAQUIN) 500 MG tablet Take 1 tablet (500 mg total) by mouth daily for 7 days.   Loratadine (ALAVERT PO) Take 1 tablet by mouth.   naratriptan (AMERGE) 2.5 MG tablet TAKE 1 TABLET BY MOUTH DAILY AS NEEDED FOR MIGRAINE. MAY REPEAT ONCE IN 4 HOURS IF NEEDED. MAX 2/DAY   Nutritional Supplements (JUICE PLUS FIBRE PO) Take 1 capsule by mouth. Plus 6 gummies   predniSONE (DELTASONE) 10 MG tablet 6 tablets on Day 1 , then reduce by 1 tablet daily until gone   predniSONE (DELTASONE) 10 MG tablet 6 tablets on Day 1 , then reduce by 1 tablet daily until gone    raloxifene (EVISTA) 60 MG tablet TAKE 1 TABLET BY MOUTH EVERY DAY   triamcinolone cream (KENALOG) 0.1 % Apply 1 Application topically 2 (two) times daily.   No facility-administered encounter medications on file as of 08/09/2022.    Allergies (verified) Penicillins   History: Past Medical History:  Diagnosis Date   Attention or concentration deficit 06/27/2021   Cervicalgia 04/21/2015   Closed fracture of multiple ribs of right side with delayed healing 05/23/2016   Hyperlipidemia    Impingement syndrome of right shoulder 02/23/2016   Migraine headache 11/20/2016   Osteoporosis 04/15/2020   Bone Density scores received, she has bone loss which is significant  I recommend treating with medication but would need to discuss the pros and cons in an office visit continue calcium 1200 to 1800 mg daily through diet and supplements ,  2000 units of vitamin d and weight bearing exercise on a regular basis, and make appt to discuss    PMB (postmenopausal bleeding) 06/23/2019   Postmenopausal atrophic vaginitis 08/23/2017   PSVT (paroxysmal supraventricular tachycardia)    Recurrent headache 07/26/2017   Shingles 07/05/2014   Urethral polyp 06/23/2019   Vitamin D deficiency 05/23/2016   Past Surgical History:  Procedure Laterality Date   APPENDECTOMY     AUGMENTATION MAMMAPLASTY Bilateral    25 years ago  BREAST ENHANCEMENT SURGERY     CARDIAC ELECTROPHYSIOLOGY STUDY AND ABLATION  March 2015   [place], [place] for SVT    OVARIAN CYST REMOVAL     removal calcification right sternoclavicular joint     TONSILLECTOMY     Family History  Problem Relation Age of Onset   Heart attack Mother    COPD Mother    Stroke Father    Heart disease Father 18   Hyperlipidemia Father    COPD Father    Hyperlipidemia Brother    Obesity Brother    Hyperlipidemia Other        family history   Breast cancer Neg Hx    Social History   Socioeconomic History   Marital status: Married     Spouse name: Not on file   Number of children: Not on file   Years of education: 12   Highest education level: High school graduate  Occupational History   Occupation: Retired    Comment: owned Estate agent  Tobacco Use   Smoking status: Never   Smokeless tobacco: Never  Substance and Sexual Activity   Alcohol use: Yes    Alcohol/week: 4.0 - 8.0 standard drinks of alcohol    Types: 4 - 8 Glasses of wine per week    Comment: 1-2 glasses of wine most nights   Drug use: No   Sexual activity: Not on file  Other Topics Concern   Not on file  Social History Narrative   Not on file   Social Determinants of Health   Financial Resource Strain: Low Risk  (08/09/2022)   Overall Financial Resource Strain (CARDIA)    Difficulty of Paying Living Expenses: Not hard at all  Food Insecurity: No Food Insecurity (08/09/2022)   Hunger Vital Sign    Worried About Running Out of Food in the Last Year: Never true    Ran Out of Food in the Last Year: Never true  Transportation Needs: No Transportation Needs (08/09/2022)   PRAPARE - Hydrologist (Medical): No    Lack of Transportation (Non-Medical): No  Physical Activity: Sufficiently Active (08/09/2022)   Exercise Vital Sign    Days of Exercise per Week: 4 days    Minutes of Exercise per Session: 120 min  Stress: No Stress Concern Present (08/09/2022)   [place]    Feeling of Stress : Not at all  Social Connections: Unknown (08/09/2022)   Social Connection and Isolation Panel [NHANES]    Frequency of Communication with Friends and Family: Not on file    Frequency of Social Gatherings with Friends and Family: Not on file    Attends Religious Services: Not on file    Active Member of Clubs or Organizations: Not on file    Attends Archivist Meetings: Not on file    Marital Status: Married    Tobacco Counseling Counseling given: Not  Answered   Clinical Intake:  Pre-visit preparation completed: Yes           How often do you need to have someone help you when you read instructions, pamphlets, or other written materials from your doctor or pharmacy?: 1 - Never    Interpreter Needed?: No      Activities of Daily Living    08/09/2022   10:34 AM  In your present state of health, do you have any difficulty performing the following activities:  Hearing? 0  Vision? 0  Difficulty concentrating or making decisions? 0  Walking or climbing stairs? 0  Dressing or bathing? 0  Doing errands, shopping? 0  Preparing Food and eating ? N  Using the Toilet? N  In the past six months, have you accidently leaked urine? N  Do you have problems with loss of bowel control? N  Managing your Medications? N  Managing your Finances? N  Housekeeping or managing your Housekeeping? N    Patient Care Team: Crecencio Mc, MD as PCP - General (Internal Medicine)  Indicate any recent Medical Services you may have received from other than [facility] providers in the past year (date may be approximate).     Assessment:   This is a routine wellness examination for Leteshia.  I connected with  Dwight Rosebrock on 08/09/22 by a audio enabled telemedicine application and verified that I am speaking with the correct person using two identifiers.  Patient Location: Home  Provider Location: Office/Clinic  I discussed the limitations of evaluation and management by telemedicine. The patient expressed understanding and agreed to proceed.   Hearing/Vision screen Hearing Screening - Comments:: Patient is able to hear conversational tones without difficulty. No issues reported. Vision Screening - Comments:: Dry eye specialist, [place] Followed by [facility] (locally) Cataract extraction, bilateral    Dietary issues and exercise activities discussed: Current Exercise Habits: Home exercise routine, Time (Minutes): 60,  Frequency (Times/Week): 4, Weekly Exercise (Minutes/Week): 240, Intensity: Moderate Healthy diet   Goals Addressed               This Visit's Progress     Patient Stated     I'd like to learn a new language (pt-stated)        Learn to speak [place] and play piano      Other     Maintain healthy lifestyle        Stay active Healthy diet       Depression Screen    08/09/2022   10:39 AM 08/05/2022    5:36 PM 01/01/2022    1:55 PM 08/06/2021    1:29 PM 12/06/2020    8:57 AM 08/03/2020    1:12 PM 08/03/2019    1:22 PM  PHQ 2/9 Scores  PHQ - 2 Score 0 0 0 0 0 0 0    Fall Risk    08/09/2022   10:38 AM 08/05/2022    5:36 PM 01/01/2022    1:55 PM 08/06/2021    1:29 PM 12/06/2020    8:57 AM  [place] in the past year? 0 0 0 0 0  Number falls in past yr: 0 0  0   Injury with Fall? 0 0     Risk for fall due to :  No Fall Risks No Fall Risks    Follow up Falls evaluation completed;Falls prevention discussed Falls evaluation completed Falls evaluation completed Falls evaluation completed Falls evaluation completed    FALL RISK PREVENTION PERTAINING TO THE HOME: Home free of loose throw rugs in walkways, pet beds, electrical cords, etc? Yes  Adequate lighting in your home to reduce risk of falls? Yes   ASSISTIVE DEVICES UTILIZED TO PREVENT FALLS: Life alert? No  Use of a cane, walker or w/c? No  Grab bars in the bathroom? No  Shower chair or bench in shower? No  Elevated toilet seat or a handicapped toilet? No   TIMED UP AND GO: Was the test performed? No .   Cognitive Function:  08/09/2022   10:49 AM 08/03/2019    1:27 PM  6CIT Screen  What Year? 0 points 0 points  What month? 0 points 0 points  What time? 0 points 0 points  Count back from 20 0 points 0 points  Months in reverse 0 points 0 points  Repeat phrase 0 points 0 points  Total Score 0 points 0 points    Immunizations Immunization History  Administered Date(s) Administered   Fluad  Quad(high Dose 65+) 02/21/2016   Influenza, High Dose Seasonal PF 04/09/2019, 04/12/2021   Influenza-Unspecified 03/11/2014, 03/08/2015, 02/21/2016, 03/22/2016, 03/31/2017, 03/31/2020, 05/20/2022   PFIZER(Purple Top)SARS-COV-2 Vaccination 08/27/2019, 09/23/2019, 06/14/2020   Tdap 03/11/2014    Screening Tests Health Maintenance  Topic Date Due   Zoster Vaccines- Shingrix (1 of 2) Never done   Pneumonia Vaccine 22+ Years old (1 of 1 - PCV) Never done   COVID-19 Vaccine (4 - 2023-24 season) 08/21/2022 (Originally 02/15/2022)   Fecal DNA (Cologuard)  12/03/2022   Medicare Annual Wellness (AWV)  08/10/2023   DTaP/Tdap/Td (2 - Td or Tdap) 03/11/2024   MAMMOGRAM  06/27/2024   INFLUENZA VACCINE  Completed   DEXA SCAN  Completed   Hepatitis C Screening  Completed   HPV VACCINES  Aged Out   COLONOSCOPY (Pts 45-105yr Insurance coverage will need to be confirmed)  Discontinued    Health Maintenance Health Maintenance Due  Topic Date Due   Zoster Vaccines- Shingrix (1 of 2) Never done   Pneumonia Vaccine 69 Years old (1 of 1 - PCV) Never done   Lung Cancer Screening: (Low Dose CT Chest recommended if Age 69-80years, 30 pack-year currently smoking OR have quit w/in 15years.) does not qualify.   Hepatitis C Screening: Completed 05/2020.  Vision Screening: Recommended annual ophthalmology exams for early detection of glaucoma and other disorders of the eye.  Dental Screening: Recommended annual dental exams for proper oral hygiene  Community Resource Referral / Chronic Care Management: CRR required this visit?  No   CCM required this visit?  No      Plan:     I have personally reviewed and noted the following in the patient's chart:   Medical and social history Use of alcohol, tobacco or illicit drugs  Current medications and supplements including opioid prescriptions. Patient is not currently taking opioid prescriptions. Functional ability and status Nutritional  status Physical activity Advanced directives List of other physicians Hospitalizations, surgeries, and ER visits in previous 12 months Vitals Screenings to include cognitive, depression, and falls Referrals and appointments  In addition, I have reviewed and discussed with patient certain preventive protocols, quality metrics, and best practice recommendations. A written personalized care plan for preventive services as well as general preventive health recommendations were provided to patient.     DFarm Loop LPN   [id number]    I have reviewed the above information and agree with above.   TDeborra Medina MD

## 2022-08-09 NOTE — Patient Instructions (Addendum)
Melody Hansen , Thank you for taking time to come for your Medicare Wellness Visit. I appreciate your ongoing commitment to your health goals. Please review the following plan we discussed and let me know if I can assist you in the future.   These are the goals we discussed:     Goals Addressed               This Visit's Progress     Patient Stated     I'd like to learn a new language (pt-stated)        Learn to speak [place] and play piano      Other     Maintain healthy lifestyle        Stay active Healthy diet       This is a list of the screening recommended for you and due dates:  Health Maintenance  Topic Date Due   Zoster (Shingles) Vaccine (1 of 2) Never done   Pneumonia Vaccine (1 of 1 - PCV) Never done   COVID-19 Vaccine (4 - 2023-24 season) 08/21/2022*   Cologuard (Stool DNA test)  12/03/2022   Medicare Annual Wellness Visit  08/10/2023   DTaP/Tdap/Td vaccine (2 - Td or Tdap) 03/11/2024   Mammogram  06/27/2024   Flu Shot  Completed   DEXA scan (bone density measurement)  Completed   Hepatitis C Screening: USPSTF Recommendation to screen - Ages 56-79 yo.  Completed   HPV Vaccine  Aged Out   Colon Cancer Screening  Discontinued  *Topic was postponed. The date shown is not the original due date.    Advanced directives: not yet completed  Conditions/risks identified: none new  Next appointment: Follow up in one year for your annual wellness visit    Preventive Care 65 Years and Older, Female Preventive care refers to lifestyle choices and visits with your health care provider that can promote health and wellness. What does preventive care include? A yearly physical exam. This is also called an annual well check. Dental exams once or twice a year. Routine eye exams. Ask your health care provider how often you should have your eyes checked. Personal lifestyle choices, including: Daily care of your teeth and gums. Regular physical activity. Eating a  healthy diet. Avoiding tobacco and drug use. Limiting alcohol use. Practicing safe sex. Taking low-dose aspirin every day. Taking vitamin and mineral supplements as recommended by your health care provider. What happens during an annual well check? The services and screenings done by your health care provider during your annual well check will depend on your age, overall health, lifestyle risk factors, and family history of disease. Counseling  Your health care provider may ask you questions about your: Alcohol use. Tobacco use. Drug use. Emotional well-being. Home and relationship well-being. Sexual activity. Eating habits. History of falls. Memory and ability to understand (cognition). Work and work Statistician. Reproductive health. Screening  You may have the following tests or measurements: Height, weight, and BMI. Blood pressure. Lipid and cholesterol levels. These may be checked every 5 years, or more frequently if you are over 21 years old. Skin check. Lung cancer screening. You may have this screening every year starting at age 64 if you have a 30-pack-year history of smoking and currently smoke or have quit within the past 15 years. Fecal occult blood test (FOBT) of the stool. You may have this test every year starting at age 76. Flexible sigmoidoscopy or colonoscopy. You may have a sigmoidoscopy every 5 years or  a colonoscopy every 10 years starting at age 1. Hepatitis C blood test. Hepatitis B blood test. Sexually transmitted disease (STD) testing. Diabetes screening. This is done by checking your blood sugar (glucose) after you have not eaten for a while (fasting). You may have this done every 1-3 years. Bone density scan. This is done to screen for osteoporosis. You may have this done starting at age 89. Mammogram. This may be done every 1-2 years. Talk to your health care provider about how often you should have regular mammograms. Talk with your health care  provider about your test results, treatment options, and if necessary, the need for more tests. Vaccines  Your health care provider may recommend certain vaccines, such as: Influenza vaccine. This is recommended every year. Tetanus, diphtheria, and acellular pertussis (Tdap, Td) vaccine. You may need a Td booster every 10 years. Zoster vaccine. You may need this after age 26. Pneumococcal 13-valent conjugate (PCV13) vaccine. One dose is recommended after age 73. Pneumococcal polysaccharide (PPSV23) vaccine. One dose is recommended after age 61. Talk to your health care provider about which screenings and vaccines you need and how often you need them. This information is not intended to replace advice given to you by your health care provider. Make sure you discuss any questions you have with your health care provider. Document Released: 06/30/2015 Document Revised: 02/21/2016 Document Reviewed: 04/04/2015 Elsevier Interactive Patient Education  2017 [place] [place] Prevention in the Home Falls can cause injuries. They can happen to people of all ages. There are many things you can do to make your home safe and to help prevent falls. What can I do on the outside of my home? Regularly fix the edges of walkways and driveways and fix any cracks. Remove anything that might make you trip as you walk through a door, such as a raised step or threshold. Trim any bushes or trees on the path to your home. Use bright outdoor lighting. Clear any walking paths of anything that might make someone trip, such as rocks or tools. Regularly check to see if handrails are loose or broken. Make sure that both sides of any steps have handrails. Any raised decks and porches should have guardrails on the edges. Have any leaves, snow, or ice cleared regularly. Use sand or salt on walking paths during winter. Clean up any spills in your garage right away. This includes oil or grease spills. What can I do in the  bathroom? Use night lights. Install grab bars by the toilet and in the tub and shower. Do not use towel bars as grab bars. Use non-skid mats or decals in the tub or shower. If you need to sit down in the shower, use a plastic, non-slip stool. Keep the floor dry. Clean up any water that spills on the floor as soon as it happens. Remove soap buildup in the tub or shower regularly. Attach bath mats securely with double-sided non-slip rug tape. Do not have throw rugs and other things on the floor that can make you trip. What can I do in the bedroom? Use night lights. Make sure that you have a light by your bed that is easy to reach. Do not use any sheets or blankets that are too big for your bed. They should not hang down onto the floor. Have a firm chair that has side arms. You can use this for support while you get dressed. Do not have throw rugs and other things on the floor that  can make you trip. What can I do in the kitchen? Clean up any spills right away. Avoid walking on wet floors. Keep items that you use a lot in easy-to-reach places. If you need to reach something above you, use a strong step stool that has a grab bar. Keep electrical cords out of the way. Do not use floor polish or wax that makes floors slippery. If you must use wax, use non-skid floor wax. Do not have throw rugs and other things on the floor that can make you trip. What can I do with my stairs? Do not leave any items on the stairs. Make sure that there are handrails on both sides of the stairs and use them. Fix handrails that are broken or loose. Make sure that handrails are as long as the stairways. Check any carpeting to make sure that it is firmly attached to the stairs. Fix any carpet that is loose or worn. Avoid having throw rugs at the top or bottom of the stairs. If you do have throw rugs, attach them to the floor with carpet tape. Make sure that you have a light switch at the top of the stairs and the  bottom of the stairs. If you do not have them, ask someone to add them for you. What else can I do to help prevent falls? Wear shoes that: Do not have high heels. Have rubber bottoms. Are comfortable and fit you well. Are closed at the toe. Do not wear sandals. If you use a stepladder: Make sure that it is fully opened. Do not climb a closed stepladder. Make sure that both sides of the stepladder are locked into place. Ask someone to hold it for you, if possible. Clearly mark and make sure that you can see: Any grab bars or handrails. First and last steps. Where the edge of each step is. Use tools that help you move around (mobility aids) if they are needed. These include: Canes. Walkers. Scooters. Crutches. Turn on the lights when you go into a dark area. Replace any light bulbs as soon as they burn out. Set up your furniture so you have a clear path. Avoid moving your furniture around. If any of your floors are uneven, fix them. If there are any pets around you, be aware of where they are. Review your medicines with your doctor. Some medicines can make you feel dizzy. This can increase your chance of falling. Ask your doctor what other things that you can do to help prevent falls. This information is not intended to replace advice given to you by your health care provider. Make sure you discuss any questions you have with your health care provider. Document Released: 03/30/2009 Document Revised: 11/09/2015 Document Reviewed: 07/08/2014 Elsevier Interactive Patient Education  2017 [employer].

## 2022-08-12 ENCOUNTER — Encounter: Payer: Self-pay | Admitting: Internal Medicine

## 2022-08-12 ENCOUNTER — Ambulatory Visit (INDEPENDENT_AMBULATORY_CARE_PROVIDER_SITE_OTHER): Payer: Medicare Other | Admitting: Internal Medicine

## 2022-08-12 VITALS — BP 96/64 | HR 90 | Temp 98.2°F | Ht 63.0 in | Wt 119.6 lb

## 2022-08-12 DIAGNOSIS — I471 Supraventricular tachycardia, unspecified: Secondary | ICD-10-CM | POA: Diagnosis not present

## 2022-08-12 DIAGNOSIS — R41840 Attention and concentration deficit: Secondary | ICD-10-CM

## 2022-08-12 DIAGNOSIS — R1319 Other dysphagia: Secondary | ICD-10-CM

## 2022-08-12 DIAGNOSIS — R002 Palpitations: Secondary | ICD-10-CM

## 2022-08-12 DIAGNOSIS — J020 Streptococcal pharyngitis: Secondary | ICD-10-CM

## 2022-08-12 DIAGNOSIS — R0989 Other specified symptoms and signs involving the circulatory and respiratory systems: Secondary | ICD-10-CM | POA: Diagnosis not present

## 2022-08-12 DIAGNOSIS — I951 Orthostatic hypotension: Secondary | ICD-10-CM | POA: Diagnosis not present

## 2022-08-12 DIAGNOSIS — R42 Dizziness and giddiness: Secondary | ICD-10-CM

## 2022-08-12 NOTE — Progress Notes (Signed)
Subjective:  Patient ID: Melody Hansen, female    DOB: [birth year]  Age: 67 y.o. MRN: [id number]  CC: The primary encounter diagnosis was Palpitations. Diagnoses of Orthostatic hypotension, Hyperinflation of lungs, Streptococcal pharyngitis, Postural dizziness with presyncope, Attention or concentration deficit, and PSVT (paroxysmal supraventricular tachycardia) were also pertinent to this visit.   HPI Amarisa Perrette presents for  Chief Complaint  Patient presents with   1 week follow up on dizziness   1) One week follow up on recent treatment for strep pharyngitis (mildly positive rapid strep test in the setting of pharyngitis lasting > 7 days), profound orthostasis . Patient completed levaquin today and prednisone yesterday,  and notes some improvement but continues to have flushing and presycope feelings with walking short distances occasionally with just standing in one place.     2) reports Dysphagia for solid foods for the past year.  Keeps liquid nearby and moistens food with water before swallowing. ,  has choked 2 or 3 times  in the past year.    3) LLL scarring of lung and hyperinflation noted on last week's chest x ray.  LLL scar noted on June 2023 Chest CT.    Outpatient Medications Prior to Visit  Medication Sig Dispense Refill   ALPRAZolam (XANAX) 0.25 MG tablet Take 1 tablet (0.25 mg total) by mouth daily as needed for anxiety. 20 tablet 0   atomoxetine (STRATTERA) 80 MG capsule Take 80 mg by mouth every morning.     fluticasone (FLONASE) 50 MCG/ACT nasal spray Place 2 sprays into both nostrils daily. 16 g 0   Loratadine (ALAVERT PO) Take 1 tablet by mouth.     naratriptan (AMERGE) 2.5 MG tablet TAKE 1 TABLET BY MOUTH DAILY AS NEEDED FOR MIGRAINE. MAY REPEAT ONCE IN 4 HOURS IF NEEDED. MAX 2/DAY 10 tablet 5   levofloxacin (LEVAQUIN) 500 MG tablet Take 1 tablet (500 mg total) by mouth daily for 7 days. (Patient not taking: Reported on 08/12/2022) 7 tablet 0   Nutritional  Supplements (JUICE PLUS FIBRE PO) Take 1 capsule by mouth. Plus 6 gummies     predniSONE (DELTASONE) 10 MG tablet 6 tablets on Day 1 , then reduce by 1 tablet daily until gone 21 tablet 0   predniSONE (DELTASONE) 10 MG tablet 6 tablets on Day 1 , then reduce by 1 tablet daily until gone (Patient not taking: Reported on 08/12/2022) 21 tablet 0   raloxifene (EVISTA) 60 MG tablet TAKE 1 TABLET BY MOUTH EVERY DAY 30 tablet 7   triamcinolone cream (KENALOG) 0.1 % Apply 1 Application topically 2 (two) times daily. 80 g 0   No facility-administered medications prior to visit.    Review of Systems;  Patient denies headache, fevers, malaise, unintentional weight loss, skin rash, eye pain, sinus congestion and sinus pain, sore throat, dysphagia,  hemoptysis , cough, dyspnea, wheezing, chest pain, palpitations, orthopnea, edema, abdominal pain, nausea, melena, diarrhea, constipation, flank pain, dysuria, hematuria, urinary  Frequency, nocturia, numbness, tingling, seizures,  Focal weakness, Loss of consciousness,  Tremor, insomnia, depression, anxiety, and suicidal ideation.      Objective:  BP 96/64   Pulse 90   Temp 98.2 F (36.8 C) (Oral)   Ht '5\' 3"'$  (1.6 m)   Wt 119 lb 9.6 oz (54.3 kg)   SpO2 96%   BMI 21.19 kg/m   BP Readings from Last 3 Encounters:  08/12/22 96/64  08/05/22 98/60  01/01/22 114/72    Wt Readings from Last 3 Encounters:  08/12/22 119 lb 9.6 oz (54.3 kg)  08/09/22 119 lb (54 kg)  08/05/22 119 lb (54 kg)    Physical Exam Vitals reviewed.  Constitutional:      General: She is not in acute distress.    Appearance: Normal appearance. She is normal weight. She is not ill-appearing, toxic-appearing or diaphoretic.  HENT:     Head: Normocephalic.  Eyes:     General: No scleral icterus.       Right eye: No discharge.        Left eye: No discharge.     Conjunctiva/sclera: Conjunctivae normal.  Cardiovascular:     Rate and Rhythm: Normal rate and regular rhythm.      Heart sounds: Normal heart sounds.  Pulmonary:     Effort: Pulmonary effort is normal. No respiratory distress.     Breath sounds: Normal breath sounds.  Musculoskeletal:        General: Normal range of motion.  Skin:    General: Skin is warm and dry.  Neurological:     General: No focal deficit present.     Mental Status: She is alert and oriented to person, place, and time. Mental status is at baseline.  Psychiatric:        Mood and Affect: Mood normal.        Behavior: Behavior normal.        Thought Content: Thought content normal.        Judgment: Judgment normal.    Lab Results  Component Value Date   HGBA1C 5.8 01/01/2022   HGBA1C 5.5 05/22/2016   HGBA1C 5.7 08/08/2014    Lab Results  Component Value Date   CREATININE 0.62 08/05/2022   CREATININE 0.58 01/01/2022   CREATININE 0.65 08/16/2021    Lab Results  Component Value Date   WBC 11.1 (H) 08/05/2022   HGB 15.0 08/05/2022   HCT 44.0 08/05/2022   PLT 434.0 (H) 08/05/2022   GLUCOSE 93 08/05/2022   CHOL 263 (H) 08/16/2021   TRIG 77.0 08/16/2021   HDL 90.20 08/16/2021   LDLDIRECT 159.0 06/23/2019   LDLCALC 157 (H) 08/16/2021   ALT 19 08/05/2022   AST 18 08/05/2022   NA 137 08/05/2022   K 4.5 08/05/2022   CL 97 08/05/2022   CREATININE 0.62 08/05/2022   BUN 17 08/05/2022   CO2 33 (H) 08/05/2022   TSH 0.73 01/01/2022   INR 0.9 06/23/2019   HGBA1C 5.8 01/01/2022    MM 3D SCREEN BREAST W/IMPLANT BILATERAL  Result Date: 06/28/2022 CLINICAL DATA:  Screening. EXAM: DIGITAL SCREENING BILATERAL MAMMOGRAM WITH IMPLANTS, CAD AND TOMOSYNTHESIS TECHNIQUE: Bilateral screening digital craniocaudal and mediolateral oblique mammograms were obtained. Bilateral screening digital breast tomosynthesis was performed. The images were evaluated with computer-aided detection. Standard and/or implant displaced views were performed. COMPARISON:  Previous exam(s). ACR Breast Density Category c: The breast tissue is heterogeneously  dense, which may obscure small masses. FINDINGS: The patient has retropectoral implants. There are no findings suspicious for malignancy. IMPRESSION: No mammographic evidence of malignancy. A result letter of this screening mammogram will be mailed directly to the patient. RECOMMENDATION: Screening mammogram in one year. (Code:SM-B-01Y) BI-RADS CATEGORY  1: Negative. Electronically Signed   By: Lajean Manes M.D.   On: 06/28/2022 10:24    Assessment & Plan:  .Palpitations -     EKG 12-Lead -     LONG TERM MONITOR (3-14 DAYS); Future  Orthostatic hypotension Assessment & Plan: Recurrent.  Checkin AM cortisol on Thursday.  Prednisone  taper finished on Sunday   Orders: -     Cortisol-am, blood; Future -     CBC with Differential/Platelet; Future -     Comprehensive metabolic panel; Future -     Antistreptolysin O titer; Future  Hyperinflation of lungs Assessment & Plan: Noted on chest films last week. Marland Kitchen PFTs were done in 2016 and read by Kasa as normal    Streptococcal pharyngitis Assessment & Plan: She has persistent symptoms of sore throat despite empiric treatment for streip with levaquin .  Culture sent today   Orders: -     Culture, Group A Strep  Postural dizziness with presyncope Assessment & Plan: Etiologyunlcear; may be multifactorial :   1) persistent orthostasis noted.   She was strongly orthostatic last week in the setting of illness.  She has been hydrating with electrolyte replacement drinks and continues to report symptoms.   Orthostatics today were also positive and she was only mildly symptomatic with INCREASE in systolic from lying to sitting but not with the 20 pt drop in systolic from sitting to standing.  Will have her return for AM cortisol, which may be elevated due to recent prednisone use (last doe Fev 25) but will be helpful if low.   2)  have repeated her EKG tdoay and fins that it is unchanged from last week . Persistent incomplete RBBB . She has a history  of SVT requiring ablation in 2015.  ZIO monitor ordered and cardiology follow   Orders: -     LONG TERM MONITOR (3-14 DAYS); Future  Attention or concentration deficit Assessment & Plan: Negative formal testing  But concentration has improved with Skipper Cliche , which she has been taking for 2 years.    PSVT (paroxysmal supraventricular tachycardia) Assessment & Plan: Treated with ablation in 2015. ZIO monitor ordered to rule out recurrent given current symptoms of dizziness and presyncope       Follow-up: No follow-ups on file.   Crecencio Mc, MD

## 2022-08-12 NOTE — Assessment & Plan Note (Addendum)
Etiologyunlcear; may be multifactorial :   1) persistent orthostasis noted.   She was strongly orthostatic last week in the setting of illness.  She has been hydrating with electrolyte replacement drinks and continues to report symptoms.   Orthostatics today were also positive and she was only mildly symptomatic with INCREASE in systolic from lying to sitting but not with the 20 pt drop in systolic from sitting to standing.  Will have her return for AM cortisol, which may be elevated due to recent prednisone use (last doe Fev 25) but will be helpful if low.   2)  have repeated her EKG tdoay and fins that it is unchanged from last week . Persistent incomplete RBBB . She has a history of SVT requiring ablation in 2015.  ZIO monitor ordered and cardiology follow

## 2022-08-12 NOTE — Assessment & Plan Note (Signed)
Negative formal testing  But concentration has improved with Skipper Cliche , which she has been taking for 2 years.

## 2022-08-12 NOTE — Assessment & Plan Note (Addendum)
She has persistent symptoms of sore throat despite empiric treatment for streip with levaquin .  Culture sent today

## 2022-08-12 NOTE — Assessment & Plan Note (Signed)
Treated with ablation in 2015. ZIO monitor ordered to rule out recurrent given current symptoms of dizziness and presyncope

## 2022-08-12 NOTE — Patient Instructions (Addendum)
Return for EARLY MORNING LABS ON THURSDAY AT 7:30 TO CHECK YOUR CORTISOL LEVEL   THROAT CULTURE WAS SENT TODAY TO RULE OUT OTHER INFECTIONS  TRY GARGLING WITH SALT WATER AND USING MUCINEX  (PLAIN MUCINEX,  NO "D")  SUSPEND STRATERRA FOR 72 HOURS TO SEE IF ANY SYMPTOMS IMPROVE   CARDIOLOGY REFERRAL  WAS PLACED LAST WEEK; YOU SHOULD BE HEARING FROM THEM THIS WEEK   ZIO MONITOR   HAS BEEN ORDERED.  THIS IS A HEART RHYTHM MONITOR YOU WILL WEAR ON YOUR CHEST WALL FOR 14 DAYS . IT WILL BE DELIVERED TO YOUR HOUSE;  IF THE INSTRUCTIONS ARE NOT HELPFUL,  JESSICA CAN PUT IT ON YOU   THE SCARRING ON YOUR LUNG IS NOT NEW.  IT WAS SEEN LAST YEAR ON YOUR CHEST CT   I AM ORDERING A SWALLOW EVALUATION AS WELL

## 2022-08-12 NOTE — Assessment & Plan Note (Signed)
Noted on chest films last week. Marland Kitchen PFTs were done in 2016 and read by Kasa as normal

## 2022-08-12 NOTE — Assessment & Plan Note (Signed)
Recurrent.  Checkin AM cortisol on Thursday.  Prednisone taper finished on Sunday

## 2022-08-13 ENCOUNTER — Ambulatory Visit: Payer: Medicare Other | Attending: Internal Medicine

## 2022-08-13 DIAGNOSIS — R1319 Other dysphagia: Secondary | ICD-10-CM | POA: Insufficient documentation

## 2022-08-13 DIAGNOSIS — R002 Palpitations: Secondary | ICD-10-CM

## 2022-08-13 DIAGNOSIS — R42 Dizziness and giddiness: Secondary | ICD-10-CM

## 2022-08-13 NOTE — Addendum Note (Signed)
Addended by: Crecencio Mc on: 08/13/2022 01:00 PM   Modules accepted: Orders

## 2022-08-13 NOTE — Assessment & Plan Note (Signed)
Chronic per patinet,  but not previously mentioned.  Ddx includes stricture,, achalasia.  DG esophagus ordered

## 2022-08-14 ENCOUNTER — Telehealth: Payer: Self-pay | Admitting: Cardiovascular Disease

## 2022-08-14 LAB — CULTURE, GROUP A STREP
MICRO NUMBER:: 14614158
SPECIMEN QUALITY:: ADEQUATE

## 2022-08-14 NOTE — Telephone Encounter (Signed)
Pt states that PCP has ordered for her to wear a monitor. Pt would like a callback as to whether she should keep f/u appt for 3/1 or wait until monitor results come back. Please advise

## 2022-08-14 NOTE — Progress Notes (Deleted)
Cardiology Office Note    Date:  08/14/2022   ID:  Hadiyah Mehus, DOB 1956-05-10, MRN [id number]  PCP:  Crecencio Mc, MD  Cardiologist:  Kathlyn Sacramento, MD  Electrophysiologist:  None   Chief Complaint: ***  History of Present Illness:   Darean Trabue is a 67 y.o. female with history of PSVT status post successful ablation in 08/2013, intermittent incomplete RBBB, and hyperlipidemia who presents for ***.  Treadmill stress test in 2013, performed for atypical chest pain, was normal.  Remote echo from 2013 showed an EF of 65 to 70%, no regional wall motion abnormalities, normal LV diastolic function parameters, normal PASP, no significant valvular abnormalities, and a normal size and structure aorta.  She was evaluated in 10/2021 for cardiac risk stratification.  At that time, she was without symptoms of angina or cardiac decompensation.  She had recently undergone evaluation for possible ADHD with MRI of the brain showing possible mild chronic microvascular ischemic changes.  EKG shows sinus rhythm without acute ST-T changes.  Calcium score in 11/2021 of 0.  She was seen by her PCP on 08/12/2022 for follow-up of strep pharyngitis complicated by presyncope, orthostasis, and palpitations.  It was noted that the patient had been "strongly orthostatic" at her visit with her PCP 1 week prior, at which time she was diagnosed with strep throat.  Orthostatics repeated at PCPs office on most recent appointment documented as positive.  BP 96/64.  EKG obtained in the office that day was unchanged from 1 week prior which again documented an intermittent previously noted incomplete RBBB that dates back at least to 2013.  Zio patch was ordered and is pending at this time.  Laboratory evaluation pending.  ***   Labs independently reviewed: 07/2022 - potassium 4.5, BUN 17, serum creatinine 0.72, albumin 4.6, AST/ALT normal, Hgb 15.0, PLT 434 12/2021 - TSH normal, A1c 5.8, TC 263, TG 77, HDL 90, LDL  157  Past Medical History:  Diagnosis Date   Attention or concentration deficit 06/27/2021   Cervicalgia 04/21/2015   Closed fracture of multiple ribs of right side with delayed healing 05/23/2016   Hyperlipidemia    Impingement syndrome of right shoulder 02/23/2016   Migraine headache 11/20/2016   Osteoporosis 04/15/2020   Bone Density scores received, she has bone loss which is significant  I recommend treating with medication but would need to discuss the pros and cons in an office visit continue calcium 1200 to 1800 mg daily through diet and supplements ,  2000 units of vitamin d and weight bearing exercise on a regular basis, and make appt to discuss    PMB (postmenopausal bleeding) 06/23/2019   Postmenopausal atrophic vaginitis 08/23/2017   PSVT (paroxysmal supraventricular tachycardia)    Recurrent headache 07/26/2017   Shingles 07/05/2014   Urethral polyp 06/23/2019   Vitamin D deficiency 05/23/2016    Past Surgical History:  Procedure Laterality Date   APPENDECTOMY     AUGMENTATION MAMMAPLASTY Bilateral    25 years ago   [place]  March 2015   [place], [place] for SVT    OVARIAN CYST REMOVAL     removal calcification right sternoclavicular joint     TONSILLECTOMY      Current Medications: No outpatient medications have been marked as taking for the 08/16/22 encounter (Appointment) with Rise Mu, PA-C.    Allergies:   Penicillins   Social History   Socioeconomic History  Marital status: Married    Spouse name: Not on file   Number of children: Not on file   Years of education: 12   Highest education level: High school graduate  Occupational History   Occupation: Retired    Comment: owned Estate agent  Tobacco Use   Smoking status: Never   Smokeless tobacco: Never  Substance and Sexual Activity   Alcohol use: Yes    Alcohol/week: 4.0 - 8.0 standard drinks of alcohol    Types: 4 - 8  Glasses of wine per week    Comment: 1-2 glasses of wine most nights   Drug use: No   Sexual activity: Not on file  Other Topics Concern   Not on file  Social History Narrative   Not on file   Social Determinants of Health   Financial Resource Strain: Low Risk  (08/09/2022)   Overall Financial Resource Strain (CARDIA)    Difficulty of Paying Living Expenses: Not hard at all  Food Insecurity: No Food Insecurity (08/09/2022)   Hunger Vital Sign    Worried About Running Out of Food in the Last Year: Never true    Ran Out of Food in the Last Year: Never true  Transportation Needs: No Transportation Needs (08/09/2022)   PRAPARE - Hydrologist (Medical): No    Lack of Transportation (Non-Medical): No  Physical Activity: Sufficiently Active (08/09/2022)   Exercise Vital Sign    Days of Exercise per Week: 4 days    Minutes of Exercise per Session: 120 min  Stress: No Stress Concern Present (08/09/2022)   [place]    Feeling of Stress : Not at all  Social Connections: Unknown (08/09/2022)   Social Connection and Isolation Panel [NHANES]    Frequency of Communication with Friends and Family: Not on file    Frequency of Social Gatherings with Friends and Family: Not on file    Attends Religious Services: Not on file    Active Member of Clubs or Organizations: Not on file    Attends Archivist Meetings: Not on file    Marital Status: Married     Family History:  The patient's family history includes COPD in her father and mother; Heart attack in her mother; Heart disease (age of onset: 52) in her father; Hyperlipidemia in her brother, father, and another family member; Obesity in her brother; Stroke in her father. There is no history of Breast cancer.  ROS:   12-point review of systems is negative unless otherwise noted in the HPI.   EKGs/Labs/Other Studies Reviewed:    Studies  reviewed were summarized above. The additional studies were reviewed today:  Calcium score 11/28/2021: Ascending Aorta: Normal size, mild aortic root and descending aorta calcifications   Pericardium: Normal   Coronary arteries: Normal origin of left and right coronary arteries. Distribution of arterial calcifications if present, as noted below;   LM 0   LAD 0   LCx 0   RCA 0   Total 0   IMPRESSION AND RECOMMENDATION: 1. Normal coronary calcium score of 0. Patient is low risk for coronary events. 2.  CAC 0, CAC-DRS A0. 3.  Continue heart healthy lifestyle and risk factor modification. __________  2D echo 10/08/2011: - Left ventricle: The cavity size was normal. Wall thickness    was normal. Systolic function was normal. The estimated    ejection fraction was in the range of 65% to 70%. Wall  motion was normal; there were no regional wall motion    abnormalities. Left ventricular diastolic function    parameters were normal.  - Pulmonary arteries: Systolic pressure was within the    normal range. This is a normal study.    EKG:  EKG is ordered today.  The EKG ordered today demonstrates ***  Recent Labs: 01/01/2022: TSH 0.73 08/05/2022: ALT 19; BUN 17; Creatinine, Ser 0.62; Hemoglobin 15.0; Platelets 434.0; Potassium 4.5; Sodium 137  Recent Lipid Panel    Component Value Date/Time   CHOL 263 (H) 08/16/2021 0848   TRIG 77.0 08/16/2021 0848   HDL 90.20 08/16/2021 0848   CHOLHDL 3 08/16/2021 0848   VLDL 15.4 08/16/2021 0848   LDLCALC 157 (H) 08/16/2021 0848   LDLDIRECT 159.0 06/23/2019 1444    PHYSICAL EXAM:    VS:  There were no vitals taken for this visit.  BMI: There is no height or weight on file to calculate BMI.  Physical Exam  Wt Readings from Last 3 Encounters:  08/12/22 119 lb 9.6 oz (54.3 kg)  08/09/22 119 lb (54 kg)  08/05/22 119 lb (54 kg)     ASSESSMENT & PLAN:   Palpitations:  Dizziness with orthostasis: ***.  CBC obtained 08/05/2022  consistent with hemoconcentration along all cell lines with a WBC count of 11.1 (recent course of prednisone) with baseline around 5-7, Hgb 15.0 with baseline around 12.5-13, and platelet count 434 with baseline around 200.  ***? Likely exacerbated by strep pharyngitis with poor oral intake***.  PSVT: Status post successful ablation in 2015, without documented evidence of recurrent arrhythmia since.  Zio patch pending as outlined above.  Not requiring a standing AV nodal blocking medication.  Intermittent incomplete right bundle branch block: EKG at PCPs office shows RSR' which has been intermittent and dates back at least to 2013, with echo at that time showing no significant structural abnormalities.  HLD: LDL 157, triglyceride 77, and HDL of 90 on most recent lipid panel.  Calcium score of 0 in 11/2021.  She is not a smoker.  Hypercalcemia: Noted on most recent labs.  Repeat labs are pending through PCPs office.  Do not suspect her calcium level is elevated to the point of contributing to her dizziness.  Follow-up with PCP.   {Are you ordering a CV Procedure (e.g. stress test, cath, DCCV, TEE, etc)?   Press F2        :[id number]     Disposition: F/u with Dr. Fletcher Anon or an APP in ***.   Medication Adjustments/Labs and Tests Ordered: Current medicines are reviewed at length with the patient today.  Concerns regarding medicines are outlined above. Medication changes, Labs and Tests ordered today are summarized above and listed in the Patient Instructions accessible in Encounters.   Melvern Banker, PA-C 08/14/2022 3:07 PM     [address] [place], [place] [id number] [phone]

## 2022-08-14 NOTE — Telephone Encounter (Signed)
Spoke with the patient and informed her to keep her appointment with cardiology because her PCP is going to get the results of the heart monitor. Patient stated that she will bring it in to ensure she puts it on correctly

## 2022-08-15 ENCOUNTER — Other Ambulatory Visit (INDEPENDENT_AMBULATORY_CARE_PROVIDER_SITE_OTHER): Payer: Medicare Other

## 2022-08-15 DIAGNOSIS — I951 Orthostatic hypotension: Secondary | ICD-10-CM

## 2022-08-15 LAB — CBC WITH DIFFERENTIAL/PLATELET
Basophils Absolute: 0.1 10*3/uL (ref 0.0–0.1)
Basophils Relative: 1.3 % (ref 0.0–3.0)
Eosinophils Absolute: 0.2 10*3/uL (ref 0.0–0.7)
Eosinophils Relative: 3.1 % (ref 0.0–5.0)
HCT: 39.1 % (ref 36.0–46.0)
Hemoglobin: 13.3 g/dL (ref 12.0–15.0)
Lymphocytes Relative: 38.4 % (ref 12.0–46.0)
Lymphs Abs: 2.6 10*3/uL (ref 0.7–4.0)
MCHC: 34.1 g/dL (ref 30.0–36.0)
MCV: 84.2 fl (ref 78.0–100.0)
Monocytes Absolute: 0.6 10*3/uL (ref 0.1–1.0)
Monocytes Relative: 8.9 % (ref 3.0–12.0)
Neutro Abs: 3.3 10*3/uL (ref 1.4–7.7)
Neutrophils Relative %: 48.3 % (ref 43.0–77.0)
Platelets: 276 10*3/uL (ref 150.0–400.0)
RBC: 4.64 Mil/uL (ref 3.87–5.11)
RDW: 13.6 % (ref 11.5–15.5)
WBC: 6.9 10*3/uL (ref 4.0–10.5)

## 2022-08-15 LAB — COMPREHENSIVE METABOLIC PANEL
ALT: 16 U/L (ref 0–35)
AST: 20 U/L (ref 0–37)
Albumin: 3.9 g/dL (ref 3.5–5.2)
Alkaline Phosphatase: 47 U/L (ref 39–117)
BUN: 14 mg/dL (ref 6–23)
CO2: 33 mEq/L — ABNORMAL HIGH (ref 19–32)
Calcium: 9.7 mg/dL (ref 8.4–10.5)
Chloride: 103 mEq/L (ref 96–112)
Creatinine, Ser: 0.57 mg/dL (ref 0.40–1.20)
GFR: 92.97 mL/min (ref >60.00)
Glucose, Bld: 79 mg/dL (ref 70–99)
Potassium: 4 mEq/L (ref 3.5–5.1)
Sodium: 142 mEq/L (ref 135–145)
Total Bilirubin: 0.4 mg/dL (ref 0.2–1.2)
Total Protein: 6.2 g/dL (ref 6.0–8.3)

## 2022-08-16 ENCOUNTER — Ambulatory Visit: Payer: Medicare Other | Admitting: Physician Assistant

## 2022-08-16 DIAGNOSIS — R002 Palpitations: Secondary | ICD-10-CM | POA: Diagnosis not present

## 2022-08-16 DIAGNOSIS — R55 Syncope and collapse: Secondary | ICD-10-CM | POA: Diagnosis not present

## 2022-08-16 DIAGNOSIS — R42 Dizziness and giddiness: Secondary | ICD-10-CM | POA: Diagnosis not present

## 2022-08-16 LAB — ANTISTREPTOLYSIN O TITER: ASO: <50 IU/mL (ref <200)

## 2022-08-16 LAB — CORTISOL-AM, BLOOD: Cortisol - AM: 24.9 ug/dL — ABNORMAL HIGH

## 2022-08-22 ENCOUNTER — Ambulatory Visit: Payer: Medicare Other | Attending: Physician Assistant | Admitting: Cardiovascular Disease

## 2022-08-22 ENCOUNTER — Encounter: Payer: Self-pay | Admitting: Cardiovascular Disease

## 2022-08-22 VITALS — BP 106/72 | HR 91 | Ht 63.5 in | Wt 123.6 lb

## 2022-08-22 DIAGNOSIS — R0602 Shortness of breath: Secondary | ICD-10-CM | POA: Diagnosis not present

## 2022-08-22 DIAGNOSIS — I471 Supraventricular tachycardia, unspecified: Secondary | ICD-10-CM

## 2022-08-22 DIAGNOSIS — R002 Palpitations: Secondary | ICD-10-CM

## 2022-08-22 DIAGNOSIS — E785 Hyperlipidemia, unspecified: Secondary | ICD-10-CM | POA: Diagnosis not present

## 2022-08-22 NOTE — Patient Instructions (Signed)
Medication Instructions:  No changes *If you need a refill on your cardiac medications before your next appointment, please call your pharmacy*   Lab Work: None ordered If you have labs (blood work) drawn today and your tests are completely normal, you will receive your results only by: [place] (if you have MyChart) OR A paper copy in the mail If you have any lab test that is abnormal or we need to change your treatment, we will call you to review the results.   Testing/Procedures: Your physician has requested that you have an echocardiogram. Echocardiography is a painless test that uses sound waves to create images of your heart. It provides your doctor with information about the size and shape of your heart and how well your heart's chambers and valves are working.   You may receive an ultrasound enhancing agent through an IV if needed to better visualize your heart during the echo. This procedure takes approximately one hour.  There are no restrictions for this procedure.  This will take place at [facility] ([place]) [id number], [place]    Follow-Up: At [facility], you and your health needs are our priority.  As part of our continuing mission to provide you with exceptional heart care, we have created designated Provider Care Teams.  These Care Teams include your primary Cardiologist (physician) and Advanced Practice Providers (APPs -  Physician Assistants and Nurse Practitioners) who all work together to provide you with the care you need, when you need it.  We recommend signing up for the patient portal called "MyChart".  Sign up information is provided on this After Visit Summary.  MyChart is used to connect with patients for Virtual Visits (Telemedicine).  Patients are able to view lab/test results, encounter notes, upcoming appointments, etc.  Non-urgent messages can be sent to your provider as well.   To learn more about what you can  do with MyChart, go to [email or web address].    Your next appointment:   Follow up as needed with Dr. Fletcher Anon

## 2022-08-22 NOTE — Progress Notes (Signed)
Cardiology Office Note   Date:  08/22/2022   ID:  [name], DOB September 01, 1955, MRN [id number]  PCP:  Crecencio Mc, MD  Cardiologist:   Kathlyn Sacramento, MD   Chief Complaint  Patient presents with   Follow-up    Concerns with issues with standing up and getting dizzy while standing. Dizzy has pretty much gone away. Patient has on heart monitor. Meds reviewed with patient.       History of Present Illness: Melody Hansen is a 67 y.o. female who presents for evaluation of palpitations and shortness of breath. She has known history of paroxysmal supraventricular tachycardia status post successful ablation in March 2015.   She was seen last year by me for risk stratification regarding hyperlipidemia.  She underwent CT calcium score which came back normal at 0. She recently got sick with strep pharyngitis and was treated with antibiotics but had severe symptoms overall that lasted for about a week.  She was very weak and had significant orthostatic dizziness.  In addition, she had some episodes of palpitations.  Although she feels better now, she continues to have significant shortness of breath.  No orthopnea, PND or lower extremity edema.  No chest pain.  She had an EKG done with Dr. Derrel Nip recently which showed incomplete right bundle branch block.  She is currently wearing a ZIO monitor.  Past Medical History:  Diagnosis Date   Attention or concentration deficit 06/27/2021   Cervicalgia 04/21/2015   Closed fracture of multiple ribs of right side with delayed healing 05/23/2016   Hyperlipidemia    Impingement syndrome of right shoulder 02/23/2016   Migraine headache 11/20/2016   Osteoporosis 04/15/2020   Bone Density scores received, she has bone loss which is significant  I recommend treating with medication but would need to discuss the pros and cons in an office visit continue calcium 1200 to 1800 mg daily through diet and supplements ,  2000 units of vitamin d and weight  bearing exercise on a regular basis, and make appt to discuss    PMB (postmenopausal bleeding) 06/23/2019   Postmenopausal atrophic vaginitis 08/23/2017   PSVT (paroxysmal supraventricular tachycardia)    Recurrent headache 07/26/2017   Shingles 07/05/2014   Urethral polyp 06/23/2019   Vitamin D deficiency 05/23/2016    Past Surgical History:  Procedure Laterality Date   APPENDECTOMY     AUGMENTATION MAMMAPLASTY Bilateral    25 years ago   Melody Hansen  March 2015   Melody Hansen, [place] for SVT    OVARIAN CYST REMOVAL     removal calcification right sternoclavicular joint     TONSILLECTOMY       Current Outpatient Medications  Medication Sig Dispense Refill   fluticasone (FLONASE) 50 MCG/ACT nasal spray Place 2 sprays into both nostrils daily. 16 g 0   Loratadine (ALAVERT PO) Take 1 tablet by mouth as needed.     naratriptan (AMERGE) 2.5 MG tablet TAKE 1 TABLET BY MOUTH DAILY AS NEEDED FOR MIGRAINE. MAY REPEAT ONCE IN 4 HOURS IF NEEDED. MAX 2/DAY 10 tablet 5   ALPRAZolam (XANAX) 0.25 MG tablet Take 1 tablet (0.25 mg total) by mouth daily as needed for anxiety. (Patient not taking: Reported on 08/22/2022) 20 tablet 0   atomoxetine (STRATTERA) 80 MG capsule Take 80 mg by mouth every morning. (Patient not taking: Reported on 08/22/2022)     No current facility-administered medications for this visit.  Allergies:   Penicillins    Social History:  The patient  reports that she has never smoked. She has never used smokeless tobacco. She reports current alcohol use of about 4.0 - 8.0 standard drinks of alcohol per week. She reports that she does not use drugs.   Family History:  The patient's family history includes COPD in her father and mother; Heart attack in her mother; Heart disease (age of onset: 59) in her father; Hyperlipidemia in her brother, father, and another family member; Obesity in her brother; Stroke in her  father.    ROS:  Please see the history of present illness.   Otherwise, review of systems are positive for none.   All other systems are reviewed and negative.    PHYSICAL EXAM: VS:  BP 106/72 (BP Location: Left Arm, Patient Position: Sitting, Cuff Size: Normal)   Pulse 91   Ht 5' 3.5" (1.613 m)   Wt 123 lb 9.6 oz (56.1 kg)   SpO2 97%   BMI 21.55 kg/m  , BMI Body mass index is 21.55 kg/m. GEN: Well nourished, well developed, in no acute distress  HEENT: normal  Neck: no JVD, carotid bruits, or masses Cardiac: RRR; no murmurs, rubs, or gallops,no edema  Respiratory:  clear to auscultation bilaterally, normal work of breathing GI: soft, nontender, nondistended, + BS MS: no deformity or atrophy  Skin: warm and dry, no rash Neuro:  Strength and sensation are intact Psych: euthymic mood, full affect   EKG:  EKG is not ordered today. I reviewed her recent EKGs which showed sinus rhythm with incomplete right bundle branch block.   Recent Labs: 01/01/2022: TSH 0.73 08/15/2022: ALT 16; BUN 14; Creatinine, Ser 0.57; Hemoglobin 13.3; Platelets 276.0; Potassium 4.0; Sodium 142    Lipid Panel    Component Value Date/Time   CHOL 263 (H) 08/16/2021 0848   TRIG 77.0 08/16/2021 0848   HDL 90.20 08/16/2021 0848   CHOLHDL 3 08/16/2021 0848   VLDL 15.4 08/16/2021 0848   LDLCALC 157 (H) 08/16/2021 0848   LDLDIRECT 159.0 06/23/2019 1444      Wt Readings from Last 3 Encounters:  08/22/22 123 lb 9.6 oz (56.1 kg)  08/12/22 119 lb 9.6 oz (54.3 kg)  08/09/22 119 lb (54 kg)          10/30/2021    2:47 PM  PAD Screen  Previous PAD dx? No  Previous surgical procedure? No  Pain with walking? No  Feet/toe relief with dangling? No  Painful, non-healing ulcers? No  Extremities discolored? No      ASSESSMENT AND PLAN:  1.  Palpitations: Previous history of SVT status post ablation.  I reviewed recent EKGs which showed incomplete right bundle branch block which is still considered  a normal variant.  She is currently wearing a ZIO monitor and will review the results once she is done.  2.  Significant dyspnea: Recent strep pharyngitis.  I requested an echocardiogram to ensure no cardiac involvement.  3.  Hyperlipidemia: Elevated LDL but her HDL is also elevated.  Continue with healthy lifestyle changes.    Disposition:   FU with me as needed if cardiac testing is abnormal  Signed,  Kathlyn Sacramento, MD  08/22/2022 10:09 AM    [place]

## 2022-09-06 DIAGNOSIS — R002 Palpitations: Secondary | ICD-10-CM | POA: Diagnosis not present

## 2022-09-06 DIAGNOSIS — R55 Syncope and collapse: Secondary | ICD-10-CM | POA: Diagnosis not present

## 2022-10-03 ENCOUNTER — Ambulatory Visit: Payer: Medicare Other | Attending: Cardiovascular Disease

## 2022-10-03 DIAGNOSIS — R0602 Shortness of breath: Secondary | ICD-10-CM | POA: Diagnosis not present

## 2022-10-03 LAB — ECHOCARDIOGRAM COMPLETE
AR max vel: 2.34 cm2
AV Area VTI: 2.5 cm2
AV Area mean vel: 2.36 cm2
AV Mean grad: 2 mmHg
AV Peak grad: 4.7 mmHg
Ao pk vel: 1.08 m/s
Area-P 1/2: 5.97 cm2
Calc EF: 52.2 %
S' Lateral: 2.4 cm
Single Plane A2C EF: 56.6 %
Single Plane A4C EF: 47.3 %

## 2022-10-15 ENCOUNTER — Ambulatory Visit (INDEPENDENT_AMBULATORY_CARE_PROVIDER_SITE_OTHER): Payer: Medicare Other | Admitting: Nurse Practitioner

## 2022-10-15 ENCOUNTER — Encounter: Payer: Self-pay | Admitting: Nurse Practitioner

## 2022-10-15 VITALS — BP 116/74 | HR 89 | Temp 97.2°F | Ht 63.5 in | Wt 121.8 lb

## 2022-10-15 DIAGNOSIS — R319 Hematuria, unspecified: Secondary | ICD-10-CM | POA: Diagnosis not present

## 2022-10-15 DIAGNOSIS — N390 Urinary tract infection, site not specified: Secondary | ICD-10-CM | POA: Diagnosis not present

## 2022-10-15 DIAGNOSIS — R300 Dysuria: Secondary | ICD-10-CM

## 2022-10-15 LAB — POC URINALSYSI DIPSTICK (AUTOMATED)
Glucose, UA: NEGATIVE
Nitrite, UA: POSITIVE
Protein, UA: POSITIVE — AB
Spec Grav, UA: >=1.03 — AB (ref 1.010–1.025)
Urobilinogen, UA: 0.2 E.U./dL
pH, UA: 5.5 (ref 5.0–8.0)

## 2022-10-15 MED ORDER — NITROFURANTOIN MONOHYD MACRO 100 MG PO CAPS: 100.0000 mg | ORAL_CAPSULE | Freq: Two times a day (BID) | ORAL | 0 refills | Status: DC

## 2022-10-15 NOTE — Patient Instructions (Signed)
Urine positive for infection. Will send urine for culture. Increase fluid intake. Sent medication to pharmacy and  would change if need when cultyure comes back. Use OTC azo for pain.

## 2022-10-15 NOTE — Progress Notes (Signed)
Established Patient Office Visit  Subjective:  Patient ID: Melody Hansen, female    DOB: Jul 19, 1955  Age: 67 y.o. MRN: [id number]  CC:  Chief Complaint  Patient presents with   Urinary Tract Infection    Pain urination with some blood on TP when she wipes Since 10/14/22    HPI  Melody Hansen presents for:  Dysuria  The current episode started yesterday. The problem has been unchanged. The quality of the pain is described as burning. The pain is at a severity of 9/10. There has been no fever. Associated symptoms include frequency, nausea and urgency. Pertinent negatives include no discharge. There is no history of kidney stones or recurrent UTIs.     Past Medical History:  Diagnosis Date   Attention or concentration deficit 06/27/2021   Cervicalgia 04/21/2015   Closed fracture of multiple ribs of right side with delayed healing 05/23/2016   Hyperlipidemia    Impingement syndrome of right shoulder 02/23/2016   Migraine headache 11/20/2016   Osteoporosis 04/15/2020   Bone Density scores received, she has bone loss which is significant  I recommend treating with medication but would need to discuss the pros and cons in an office visit continue calcium 1200 to 1800 mg daily through diet and supplements ,  2000 units of vitamin d and weight bearing exercise on a regular basis, and make appt to discuss    PMB (postmenopausal bleeding) 06/23/2019   Postmenopausal atrophic vaginitis 08/23/2017   PSVT (paroxysmal supraventricular tachycardia)    Recurrent headache 07/26/2017   Shingles 07/05/2014   Urethral polyp 06/23/2019   Vitamin D deficiency 05/23/2016    Past Surgical History:  Procedure Laterality Date   APPENDECTOMY     AUGMENTATION MAMMAPLASTY Bilateral    25 years ago   BREAST ENHANCEMENT SURGERY     CARDIAC ELECTROPHYSIOLOGY STUDY AND ABLATION  March 2015   [place], [place] for SVT    OVARIAN CYST REMOVAL     removal calcification right sternoclavicular  joint     TONSILLECTOMY      Family History  Problem Relation Age of Onset   Heart attack Mother    COPD Mother    Stroke Father    Heart disease Father 31   Hyperlipidemia Father    COPD Father    Hyperlipidemia Brother    Obesity Brother    Hyperlipidemia Other        family history   Breast cancer Neg Hx     Social History   Socioeconomic History   Marital status: Married    Spouse name: Not on file   Number of children: Not on file   Years of education: 12   Highest education level: High school graduate  Occupational History   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  Tobacco Use   Smoking status: Never   Smokeless tobacco: Never  Substance and Sexual Activity   Alcohol use: Yes    Alcohol/week: 4.0 - 8.0 standard drinks of alcohol    Types: 4 - 8 Glasses of wine per week    Comment: 1-2 glasses of wine most nights   Drug use: No   Sexual activity: Not on file  Other Topics Concern   Not on file  Social History Narrative   Not on file   Social Determinants of Health   Financial Resource Strain: Low Risk  (08/09/2022)   Overall Financial Resource Strain (CARDIA)    Difficulty of Paying Living Expenses: Not hard  at all  Food Insecurity: No Food Insecurity (08/09/2022)   Hunger Vital Sign    Worried About Running Out of Food in the Last Year: Never true    Ran Out of Food in the Last Year: Never true  Transportation Needs: No Transportation Needs (08/09/2022)   PRAPARE - Administrator, Civil Service (Medical): No    Lack of Transportation (Non-Medical): No  Physical Activity: Sufficiently Active (08/09/2022)   Exercise Vital Sign    Days of Exercise per Week: 4 days    Minutes of Exercise per Session: 120 min  Stress: No Stress Concern Present (08/09/2022)   [employer] of Occupational Health - Occupational Stress Questionnaire    Feeling of Stress : Not at all  Social Connections: Unknown (08/09/2022)   Social Connection and  Isolation Panel [NHANES]    Frequency of Communication with Friends and Family: Not on file    Frequency of Social Gatherings with Friends and Family: Not on file    Attends Religious Services: Not on file    Active Member of Clubs or Organizations: Not on file    Attends Banker Meetings: Not on file    Marital Status: Married  Intimate Partner Violence: Not At Risk (08/09/2022)   Humiliation, Afraid, Rape, and Kick questionnaire    Fear of Current or Ex-Partner: No    Emotionally Abused: No    Physically Abused: No    Sexually Abused: No     Outpatient Medications Prior to Visit  Medication Sig Dispense Refill   ALPRAZolam (XANAX) 0.25 MG tablet Take 1 tablet (0.25 mg total) by mouth daily as needed for anxiety. 20 tablet 0   atomoxetine (STRATTERA) 80 MG capsule Take 80 mg by mouth every morning.     fluticasone (FLONASE) 50 MCG/ACT nasal spray Place 2 sprays into both nostrils daily. 16 g 0   Loratadine (ALAVERT PO) Take 1 tablet by mouth as needed.     naratriptan (AMERGE) 2.5 MG tablet TAKE 1 TABLET BY MOUTH DAILY AS NEEDED FOR MIGRAINE. MAY REPEAT ONCE IN 4 HOURS IF NEEDED. MAX 2/DAY 10 tablet 5   No facility-administered medications prior to visit.    Allergies  Allergen Reactions   Penicillins Hives    Has patient had a PCN reaction causing immediate rash, facial/tongue/throat swelling, SOB or lightheadedness with hypotension: Yes Has patient had a PCN reaction causing severe rash involving mucus membranes or skin necrosis: No Has patient had a PCN reaction that required hospitalization: No Has patient had a PCN reaction occurring within the last 10 years: No If all of the above answers are "NO", then may proceed with Cephalosporin use.    ROS Review of Systems  Constitutional: Negative.   Respiratory: Negative.    Cardiovascular: Negative.   Gastrointestinal:  Positive for nausea.  Genitourinary:  Positive for dysuria, frequency and urgency.   Neurological: Negative.   Psychiatric/Behavioral: Negative.        Objective:    Physical Exam Constitutional:      Appearance: Normal appearance.  Eyes:     Pupils: Pupils are equal, round, and reactive to light.  Cardiovascular:     Rate and Rhythm: Normal rate and regular rhythm.     Pulses: Normal pulses.     Heart sounds: Normal heart sounds.  Pulmonary:     Effort: Pulmonary effort is normal.     Breath sounds: Normal breath sounds. No stridor. No wheezing.  Abdominal:     General: Bowel  sounds are normal.     Tenderness: There is abdominal tenderness in the periumbilical area and suprapubic area. There is no right CVA tenderness, left CVA tenderness or rebound.  Skin:    General: Skin is warm and dry.  Neurological:     General: No focal deficit present.     Mental Status: She is alert and oriented to person, place, and time. Mental status is at baseline.  Psychiatric:        Mood and Affect: Mood normal.        Behavior: Behavior normal.        Thought Content: Thought content normal.        Judgment: Judgment normal.     BP 116/74   Pulse 89   Temp (!) 97.2 F (36.2 C)   Ht 5' 3.5" (1.613 m)   Wt 121 lb 12.8 oz (55.2 kg)   SpO2 98%   BMI 21.24 kg/m  Wt Readings from Last 3 Encounters:  10/15/22 121 lb 12.8 oz (55.2 kg)  08/22/22 123 lb 9.6 oz (56.1 kg)  08/12/22 119 lb 9.6 oz (54.3 kg)     Health Maintenance  Topic Date Due   Zoster Vaccines- Shingrix (1 of 2) Never done   Pneumonia Vaccine 18+ Years old (1 of 1 - PCV) Never done   COVID-19 Vaccine (4 - 2023-24 season) 10/31/2022 (Originally 02/15/2022)   Fecal DNA (Cologuard)  12/03/2022   INFLUENZA VACCINE  01/16/2023   Medicare Annual Wellness (AWV)  08/10/2023   DTaP/Tdap/Td (2 - Td or Tdap) 03/11/2024   MAMMOGRAM  06/27/2024   DEXA SCAN  Completed   Hepatitis C Screening  Completed   HPV VACCINES  Aged Out   COLONOSCOPY (Pts 45-71yrs Insurance coverage will need to be confirmed)   Discontinued    There are no preventive care reminders to display for this patient.  Lab Results  Component Value Date   TSH 0.73 01/01/2022   Lab Results  Component Value Date   WBC 6.9 08/15/2022   HGB 13.3 08/15/2022   HCT 39.1 08/15/2022   MCV 84.2 08/15/2022   PLT 276.0 08/15/2022   Lab Results  Component Value Date   NA 142 08/15/2022   K 4.0 08/15/2022   CO2 33 (H) 08/15/2022   GLUCOSE 79 08/15/2022   BUN 14 08/15/2022   CREATININE 0.57 08/15/2022   BILITOT 0.4 08/15/2022   ALKPHOS 47 08/15/2022   AST 20 08/15/2022   ALT 16 08/15/2022   PROT 6.2 08/15/2022   ALBUMIN 3.9 08/15/2022   CALCIUM 9.7 08/15/2022   ANIONGAP 9 09/30/2011   GFR 92.97 08/15/2022   Lab Results  Component Value Date   CHOL 263 (H) 08/16/2021   Lab Results  Component Value Date   HDL 90.20 08/16/2021   Lab Results  Component Value Date   LDLCALC 157 (H) 08/16/2021   Lab Results  Component Value Date   TRIG 77.0 08/16/2021   Lab Results  Component Value Date   CHOLHDL 3 08/16/2021   Lab Results  Component Value Date   HGBA1C 5.8 01/01/2022      Assessment & Plan:  Urinary tract infection with hematuria, site unspecified Assessment & Plan: Urinalysis positive for nitrite, leukocytes and protein. Will start on Macrobid 10 mg twice a day for 10 days Will send urine for culture. If needed would change the antibiotic treatment according to the culture result. Advised to increase fluid intake and use over-the-counter Azo for pain management.  Orders: -  Urine Culture  Dysuria -     POCT Urinalysis Dipstick (Automated) -     Urine Culture  Other orders -     Nitrofurantoin Monohyd Macro; Take 1 capsule (100 mg total) by mouth 2 (two) times daily. Take with food.  Dispense: 10 capsule; Refill: 0    Follow-up: No follow-ups on file.   Kara Dies, NP

## 2022-10-17 LAB — URINE CULTURE
MICRO NUMBER:: 14892946
SPECIMEN QUALITY:: ADEQUATE

## 2022-10-21 DIAGNOSIS — N390 Urinary tract infection, site not specified: Secondary | ICD-10-CM | POA: Insufficient documentation

## 2022-10-21 NOTE — Assessment & Plan Note (Signed)
Urinalysis positive for nitrite, leukocytes and protein. Will start on Macrobid 10 mg twice a day for 10 days Will send urine for culture. If needed would change the antibiotic treatment according to the culture result. Advised to increase fluid intake and use over-the-counter Azo for pain management.

## 2022-11-05 ENCOUNTER — Ambulatory Visit: Payer: Medicare Other | Admitting: Internal Medicine

## 2023-01-06 ENCOUNTER — Encounter: Payer: Medicare Other | Admitting: Internal Medicine

## 2023-01-30 ENCOUNTER — Encounter (INDEPENDENT_AMBULATORY_CARE_PROVIDER_SITE_OTHER): Payer: Self-pay

## 2023-03-03 ENCOUNTER — Encounter: Payer: Self-pay | Admitting: Internal Medicine

## 2023-03-03 ENCOUNTER — Ambulatory Visit (INDEPENDENT_AMBULATORY_CARE_PROVIDER_SITE_OTHER): Payer: Medicare Other | Admitting: Internal Medicine

## 2023-03-03 VITALS — BP 92/68 | HR 83 | Temp 97.5°F | Ht 63.5 in | Wt 126.0 lb

## 2023-03-03 DIAGNOSIS — Z0001 Encounter for general adult medical examination with abnormal findings: Secondary | ICD-10-CM

## 2023-03-03 DIAGNOSIS — R1030 Lower abdominal pain, unspecified: Secondary | ICD-10-CM

## 2023-03-03 DIAGNOSIS — Z79899 Other long term (current) drug therapy: Secondary | ICD-10-CM

## 2023-03-03 DIAGNOSIS — R140 Abdominal distension (gaseous): Secondary | ICD-10-CM | POA: Diagnosis not present

## 2023-03-03 DIAGNOSIS — Z1211 Encounter for screening for malignant neoplasm of colon: Secondary | ICD-10-CM | POA: Diagnosis not present

## 2023-03-03 DIAGNOSIS — E7800 Pure hypercholesterolemia, unspecified: Secondary | ICD-10-CM | POA: Diagnosis not present

## 2023-03-03 DIAGNOSIS — R231 Pallor: Secondary | ICD-10-CM | POA: Insufficient documentation

## 2023-03-03 DIAGNOSIS — I679 Cerebrovascular disease, unspecified: Secondary | ICD-10-CM | POA: Diagnosis not present

## 2023-03-03 DIAGNOSIS — Z0000 Encounter for general adult medical examination without abnormal findings: Secondary | ICD-10-CM | POA: Diagnosis not present

## 2023-03-03 DIAGNOSIS — R82998 Other abnormal findings in urine: Secondary | ICD-10-CM

## 2023-03-03 DIAGNOSIS — N950 Postmenopausal bleeding: Secondary | ICD-10-CM

## 2023-03-03 DIAGNOSIS — R41840 Attention and concentration deficit: Secondary | ICD-10-CM

## 2023-03-03 LAB — LIPID PANEL
Cholesterol: 273 mg/dL — ABNORMAL HIGH (ref 0–200)
HDL: 98.9 mg/dL (ref >39.00)
LDL Cholesterol: 139 mg/dL — ABNORMAL HIGH (ref 0–99)
NonHDL: 174
Total CHOL/HDL Ratio: 3
Triglycerides: 175 mg/dL — ABNORMAL HIGH (ref 0.0–149.0)
VLDL: 35 mg/dL (ref 0.0–40.0)

## 2023-03-03 LAB — SEDIMENTATION RATE: Sed Rate: 2 mm/h (ref 0–30)

## 2023-03-03 LAB — URINALYSIS, ROUTINE W REFLEX MICROSCOPIC
Bilirubin Urine: NEGATIVE
Hgb urine dipstick: NEGATIVE
Ketones, ur: NEGATIVE
Leukocytes,Ua: NEGATIVE
Nitrite: NEGATIVE
Specific Gravity, Urine: 1.015 (ref 1.000–1.030)
Total Protein, Urine: NEGATIVE
Urine Glucose: NEGATIVE
Urobilinogen, UA: 0.2 (ref 0.0–1.0)
pH: 7 (ref 5.0–8.0)

## 2023-03-03 LAB — COMPREHENSIVE METABOLIC PANEL
ALT: 18 U/L (ref 0–35)
AST: 20 U/L (ref 0–37)
Albumin: 4.4 g/dL (ref 3.5–5.2)
Alkaline Phosphatase: 58 U/L (ref 39–117)
BUN: 16 mg/dL (ref 6–23)
CO2: 31 meq/L (ref 19–32)
Calcium: 9.9 mg/dL (ref 8.4–10.5)
Chloride: 99 meq/L (ref 96–112)
Creatinine, Ser: 0.53 mg/dL (ref 0.40–1.20)
GFR: 94.25 mL/min (ref >60.00)
Glucose, Bld: 91 mg/dL (ref 70–99)
Potassium: 4.1 meq/L (ref 3.5–5.1)
Sodium: 139 meq/L (ref 135–145)
Total Bilirubin: 0.6 mg/dL (ref 0.2–1.2)
Total Protein: 6.6 g/dL (ref 6.0–8.3)

## 2023-03-03 LAB — CBC WITH DIFFERENTIAL/PLATELET
Basophils Absolute: 0.1 10*3/uL (ref 0.0–0.1)
Basophils Relative: 0.8 % (ref 0.0–3.0)
Eosinophils Absolute: 0.1 10*3/uL (ref 0.0–0.7)
Eosinophils Relative: 1.5 % (ref 0.0–5.0)
HCT: 43.4 % (ref 36.0–46.0)
Hemoglobin: 13.9 g/dL (ref 12.0–15.0)
Lymphocytes Relative: 20.8 % (ref 12.0–46.0)
Lymphs Abs: 1.6 10*3/uL (ref 0.7–4.0)
MCHC: 32 g/dL (ref 30.0–36.0)
MCV: 87.6 fl (ref 78.0–100.0)
Monocytes Absolute: 0.7 10*3/uL (ref 0.1–1.0)
Monocytes Relative: 8.6 % (ref 3.0–12.0)
Neutro Abs: 5.2 10*3/uL (ref 1.4–7.7)
Neutrophils Relative %: 68.3 % (ref 43.0–77.0)
Platelets: 253 10*3/uL (ref 150.0–400.0)
RBC: 4.96 Mil/uL (ref 3.87–5.11)
RDW: 14.2 % (ref 11.5–15.5)
WBC: 7.6 10*3/uL (ref 4.0–10.5)

## 2023-03-03 LAB — TSH: TSH: 0.57 u[IU]/mL (ref 0.35–5.50)

## 2023-03-03 LAB — HEMOGLOBIN A1C: Hgb A1c MFr Bld: 5.6 % (ref 4.6–6.5)

## 2023-03-03 LAB — LDL CHOLESTEROL, DIRECT: Direct LDL: 160 mg/dL

## 2023-03-03 NOTE — Assessment & Plan Note (Signed)
Noted on forearms and lower extremities.  Etiology unclear.  She has no symptoms of systemic vasculitis and states that the rash has been intermittent for the past several months and is not related to any medication changes .    Checking ANA and ESR.  Referring to Dr Adolphus Birchwood for skin biopsy

## 2023-03-03 NOTE — Patient Instructions (Addendum)
Referral to Dr Adolphus Birchwood for skin biopsy is recommended inf the "livedo reticularis" persists   If the pubic pain is not due ot a UTI,  I recommend a pelvic ultrasound and will order

## 2023-03-03 NOTE — Assessment & Plan Note (Signed)
chronic small vessel ischemic changes noted on recent MRI brain.  Sshe has declined statin therapy,  cardiac evaluation by o Dr. Kirke Corin  included a ZERO score on her  coronary calcium CT

## 2023-03-03 NOTE — Progress Notes (Unsigned)
Patient ID: Melody Hansen, female    DOB: Mar 24, 1956  Age: 67 y.o. MRN: [id number]  The patient is here for annual preventive examination and management of other chronic and acute problems.   The risk factors are reflected in the social history.   The roster of all physicians providing medical care to patient - is listed in the Snapshot section of the chart.   Activities of daily living:  The patient is 100% independent in all ADLs: dressing, toileting, feeding as well as independent mobility   Home safety : The patient has smoke detectors in the home. They wear seatbelts.  There are no unsecured firearms at home. There is no violence in the home.    There is no risks for hepatitis, STDs or HIV. There is no   history of blood transfusion. They have no travel history to infectious disease endemic areas of the world.   The patient has seen their dentist in the last six month. They have seen their eye doctor in the last year. The patinet  denies slight hearing difficulty with regard to whispered voices and some television programs.  They have deferred audiologic testing in the last year.  They do not  have excessive sun exposure. Discussed the need for sun protection: hats, long sleeves and use of sunscreen if there is significant sun exposure.    Diet: the importance of a healthy diet is discussed. They do have a healthy diet.   The benefits of regular aerobic exercise were discussed. The patient  exercises  3 to 5 days per week  for  60 minutes.    Depression screen: there are no signs or vegative symptoms of depression- irritability, change in appetite, anhedonia, sadness/tearfullness.   The following portions of the patient's history were reviewed and updated as appropriate: allergies, current medications, past family history, past medical history,  past surgical history, past social history  and problem list.   Visual acuity was not assessed per patient preference since the patient has  regular follow up with an  ophthalmologist. Hearing and body mass index were assessed and reviewed.    During the course of the visit the patient was educated and counseled about appropriate screening and preventive services including : fall prevention , diabetes screening, nutrition counseling, colorectal cancer screening, and recommended immunizations.    Chief Complaint:  1) palpitations,  dyspnea:  reviewed ZIO, ECHO , Arida follow up.  All normal.   2) livedo reticularis on exam today,  no symptoms or rash or itching,  comes and goes .  For the past several months. Has episodes of bilateral base of thumb pain , no frequent headaches. But has current Reynaud's symptoms involving the left ring finger  3) bloating,    suprapubic tenderness   Review of Symptoms  Patient denies headache, fesers, malaise, unintentional weight loss, skin rash, eye pain, sinus congestion and sinus pain, sore throat, dysphagia,  hemoptysis , cough, dyspnea, wheezing, chest pain, palpitations, orthopnea, edema, abdominal pain, nausea, melena, diarrhea, constipation, flank pain, dysuria, hematuria, urinary  Frequency, nocturia, numbness, tingling, seizures,  Focal weakness, Loss of consciousness,  Tremor, insomnia, depression, anxiety, and suicidal ideation.    Physical Exam:  BP 92/68   Pulse 83   Temp (!) 97.5 F (36.4 C) (Oral)   Ht 5' 3.5" (1.613 m)   Wt 126 lb (57.2 kg)   SpO2 98%   BMI 21.97 kg/m    Physical Exam Vitals reviewed.  Constitutional:  General: She is not in acute distress.    Appearance: Normal appearance. She is normal weight. She is not ill-appearing, toxic-appearing or diaphoretic.  HENT:     Head: Normocephalic.  Eyes:     General: No scleral icterus.       Right eye: No discharge.        Left eye: No discharge.     Conjunctiva/sclera: Conjunctivae normal.  Cardiovascular:     Rate and Rhythm: Normal rate. Rhythm irregular.     Heart sounds: Normal heart sounds.   Pulmonary:     Effort: Pulmonary effort is normal. No respiratory distress.     Breath sounds: Normal breath sounds.  Musculoskeletal:        General: Normal range of motion.  Skin:    General: Skin is warm and dry.  Neurological:     General: No focal deficit present.     Mental Status: She is alert and oriented to person, place, and time. Mental status is at baseline.  Psychiatric:        Mood and Affect: Mood normal.        Behavior: Behavior normal.        Thought Content: Thought content normal.        Judgment: Judgment normal.   Assessment and Plan: Colon cancer screening  Pure hypercholesterolemia  Long-term use of high-risk medication    No follow-ups on file.  Sherlene Shams, MD

## 2023-03-03 NOTE — Assessment & Plan Note (Signed)
Negative formal testing  But concentration has improved with Blase Mess , which she has been taking for 2 years.

## 2023-03-04 ENCOUNTER — Encounter: Payer: Self-pay | Admitting: Internal Medicine

## 2023-03-04 DIAGNOSIS — R140 Abdominal distension (gaseous): Secondary | ICD-10-CM | POA: Insufficient documentation

## 2023-03-04 LAB — URINE CULTURE
MICRO NUMBER:: 15471314
Result:: NO GROWTH
SPECIMEN QUALITY:: ADEQUATE

## 2023-03-04 NOTE — Addendum Note (Signed)
Addended by: Sherlene Shams on: 03/04/2023 05:21 PM   Modules accepted: Orders

## 2023-03-04 NOTE — Assessment & Plan Note (Signed)
PAP SMEAR DONE. Coags,  CBC,  UA , thyroid ordered.   Gyn referral for T/v ultrasound and biopsy if indicated

## 2023-03-04 NOTE — Assessment & Plan Note (Signed)

## 2023-03-04 NOTE — Assessment & Plan Note (Signed)
Without constipation.  Checking urine for UTI.  CA 125 ordered with labs.  Will need transvaginal US if UA is normal

## 2023-03-05 ENCOUNTER — Telehealth: Payer: Self-pay | Admitting: Internal Medicine

## 2023-03-05 ENCOUNTER — Encounter: Payer: Self-pay | Admitting: Internal Medicine

## 2023-03-05 LAB — CA 125: CA 125: 10 U/mL (ref <35)

## 2023-03-05 NOTE — Telephone Encounter (Signed)
Patient just called back to schedule her CT scan. Could you give her a call back.

## 2023-03-05 NOTE — Telephone Encounter (Signed)
Lft pt vm to call ofc to sch CT. thanks 

## 2023-03-10 DIAGNOSIS — Z1211 Encounter for screening for malignant neoplasm of colon: Secondary | ICD-10-CM | POA: Diagnosis not present

## 2023-03-11 ENCOUNTER — Ambulatory Visit
Admission: RE | Admit: 2023-03-11 | Discharge: 2023-03-11 | Disposition: A | Discharge status: Home / Self Care | Payer: Medicare Other | Source: Ambulatory Visit | Attending: Internal Medicine | Admitting: Internal Medicine

## 2023-03-11 DIAGNOSIS — R1030 Lower abdominal pain, unspecified: Secondary | ICD-10-CM | POA: Diagnosis not present

## 2023-03-11 DIAGNOSIS — R140 Abdominal distension (gaseous): Secondary | ICD-10-CM | POA: Diagnosis not present

## 2023-03-11 DIAGNOSIS — N200 Calculus of kidney: Secondary | ICD-10-CM | POA: Diagnosis not present

## 2023-03-11 DIAGNOSIS — R109 Unspecified abdominal pain: Secondary | ICD-10-CM | POA: Diagnosis not present

## 2023-03-14 LAB — COLOGUARD: COLOGUARD: NEGATIVE

## 2023-03-25 NOTE — Assessment & Plan Note (Signed)
CT abd and pelvis was normal except for gb sludge without evidence of inflammation or obstruction (oct 2024)  GI referral offered for possible IBS

## 2023-07-16 ENCOUNTER — Telehealth: Payer: Self-pay

## 2023-07-16 DIAGNOSIS — D2261 Melanocytic nevi of right upper limb, including shoulder: Secondary | ICD-10-CM | POA: Diagnosis not present

## 2023-07-16 DIAGNOSIS — L821 Other seborrheic keratosis: Secondary | ICD-10-CM | POA: Diagnosis not present

## 2023-07-16 DIAGNOSIS — D2272 Melanocytic nevi of left lower limb, including hip: Secondary | ICD-10-CM | POA: Diagnosis not present

## 2023-07-16 DIAGNOSIS — X32XXXA Exposure to sunlight, initial encounter: Secondary | ICD-10-CM | POA: Diagnosis not present

## 2023-07-16 DIAGNOSIS — D225 Melanocytic nevi of trunk: Secondary | ICD-10-CM | POA: Diagnosis not present

## 2023-07-16 DIAGNOSIS — D2262 Melanocytic nevi of left upper limb, including shoulder: Secondary | ICD-10-CM | POA: Diagnosis not present

## 2023-07-16 DIAGNOSIS — L570 Actinic keratosis: Secondary | ICD-10-CM | POA: Diagnosis not present

## 2023-07-16 DIAGNOSIS — D2271 Melanocytic nevi of right lower limb, including hip: Secondary | ICD-10-CM | POA: Diagnosis not present

## 2023-07-16 NOTE — Telephone Encounter (Signed)
LMTCB. Please relay the message below when pt returns the call.

## 2023-07-16 NOTE — Telephone Encounter (Signed)
Copied from CRM [id number]. Topic: Clinical - Medication Question >> Jul 16, 2023 10:29 AM Melody Hansen wrote: Reason for CRM: Patient states she currently has Hansen fever blister that's healing, she is leaving to go on vacation on Saturday to [place] & she states the sun is the issue. Patient is requesting Hansen medication to help.    Patient is requesting Hansen callback.

## 2023-07-22 IMAGING — CT CT CARDIAC CORONARY ARTERY CALCIUM SCORE
3 series · 14 of 20 positions shown, 16 images · non-contrast
Comparison: None Available.
COMPARISON: None Available.

Addendum:
EXAM:
OVER-READ INTERPRETATION  CT CHEST

The following report is a limited chest CT over-read performed by
This over-read does not include interpretation of cardiac or
coronary anatomy or pathology. The coronary calcium score
interpretation by the cardiologist is attached.
CLINICAL DATA: Risk stratification
Coronary Calcium Score
TECHNIQUE: The patient was scanned on a Siemens Somatom go.Top Scanner. Axial
non-contrast 3 mm slices were carried out through the heart. The
data set was analyzed on a dedicated work station and scored using
the Agatson method.

[Series 2: sa36 calcium scoring 3.00 · axial · 0.27mm/px · z∈[-1126,-1027]mm · 4 of 57 slices shown]
[im 12/57  vessel]
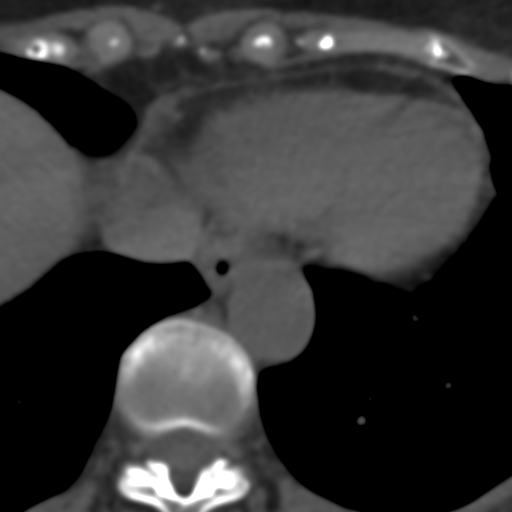
[im 23/57  vessel]
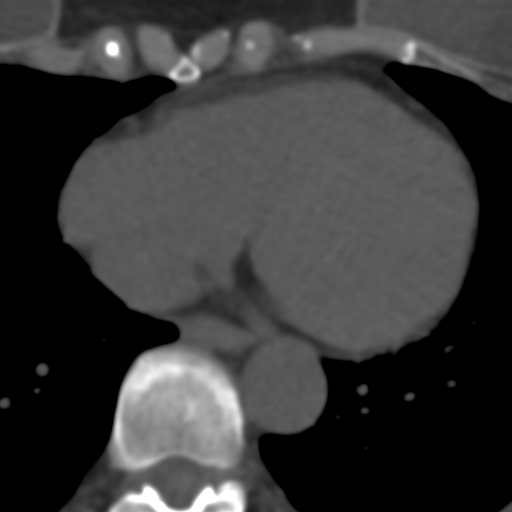
[im 34/57  vessel]
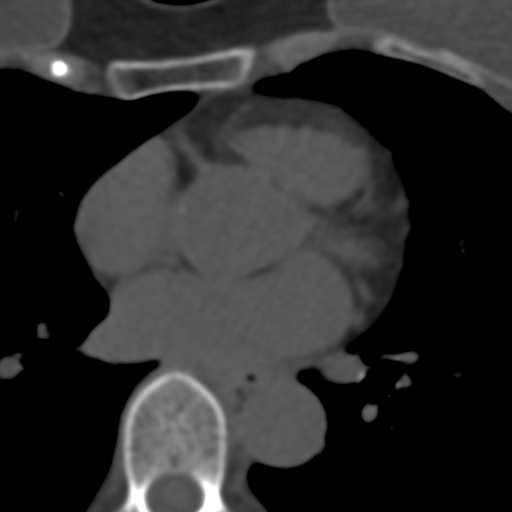
[im 45/57  vessel]
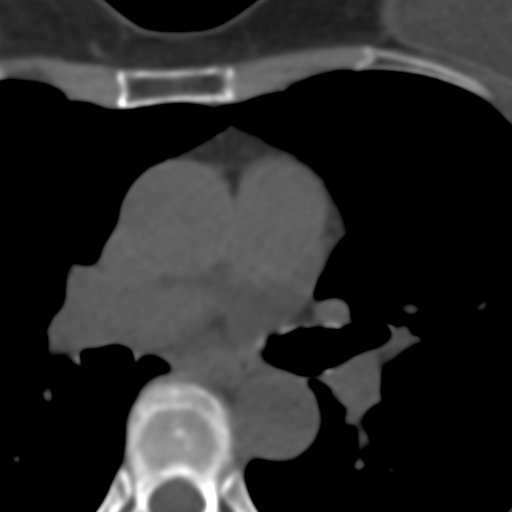

[Series 5: full fov st calcium scoring 3.00 · axial · 0.54mm/px · z∈[-1132,-1021]mm · 5 of 57 slices shown, 7 images]
[im 10/57  vessel]
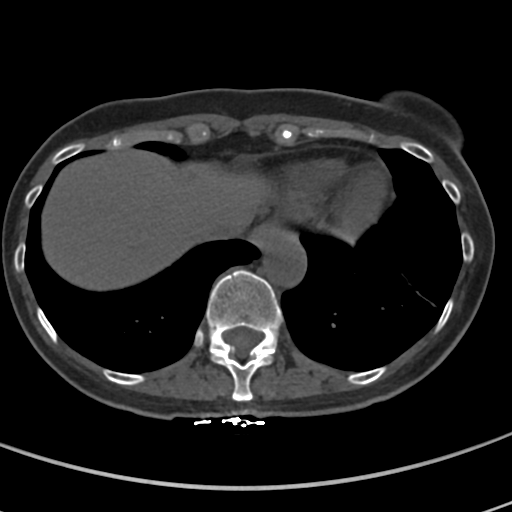
[im 10/57  lung]
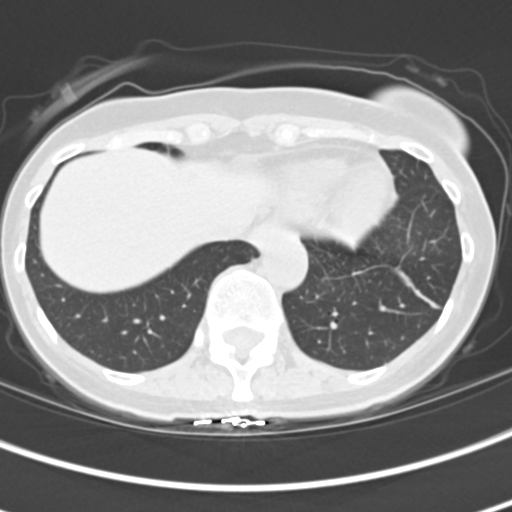
[im 19/57  vessel]
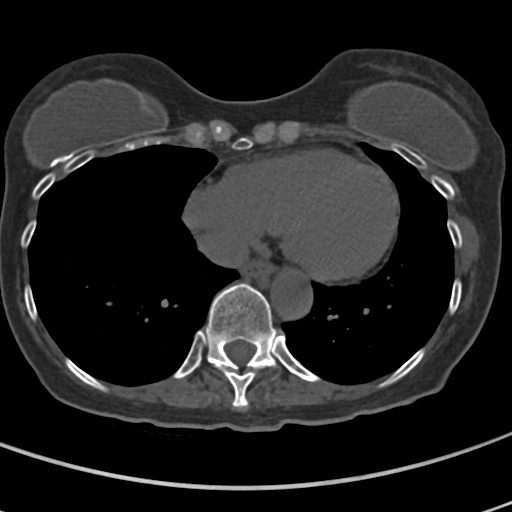
[im 29/57  vessel]
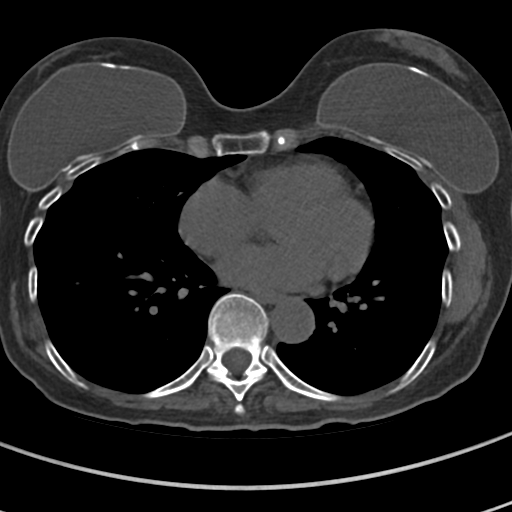
[im 38/57  vessel]
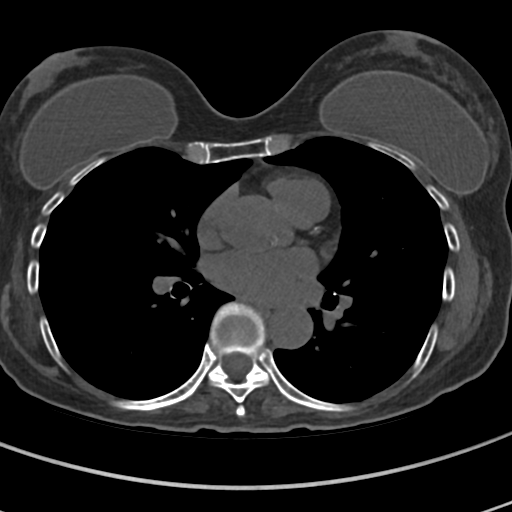
[im 47/57  vessel]
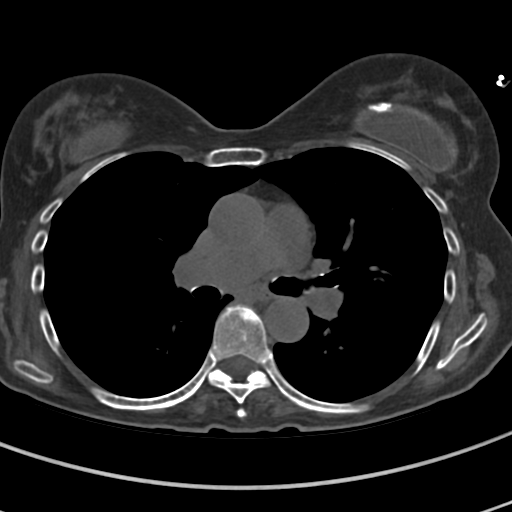
[im 47/57  lung]
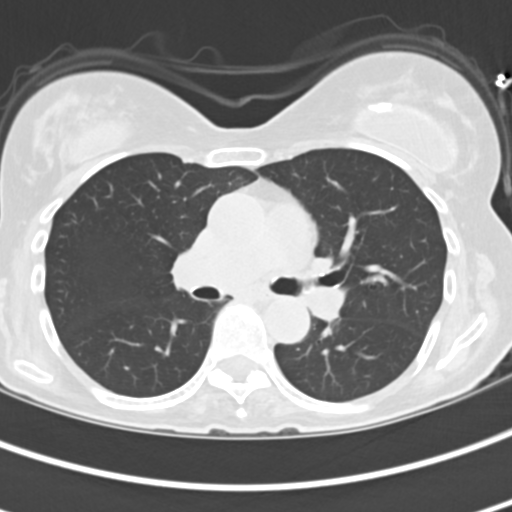

[Series 10: full fov lungs calcium scoring 3.00 ax · axial · 0.54mm/px · z∈[-1132,-1021]mm · 5 of 57 slices shown]
[im 10/57  vessel]
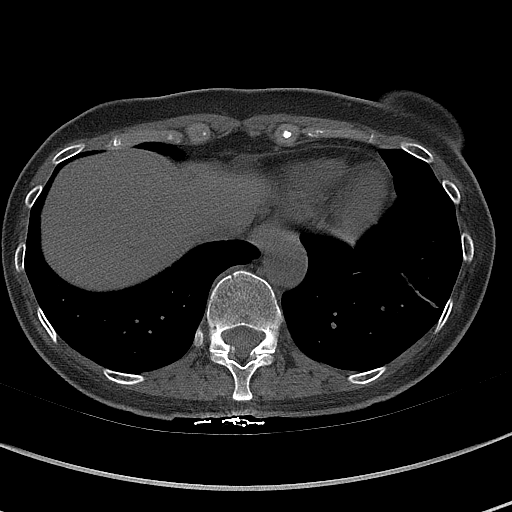
[im 19/57  vessel]
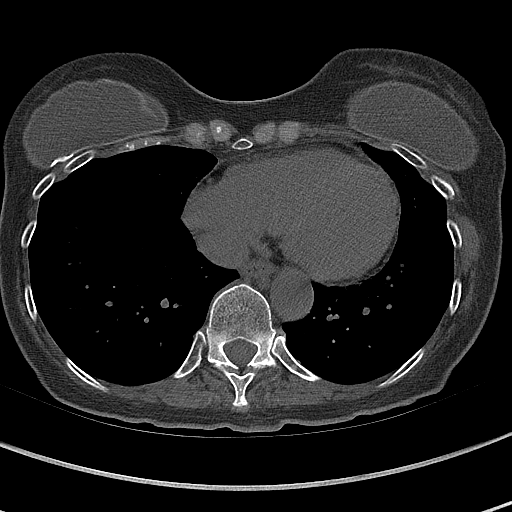
[im 29/57  vessel]
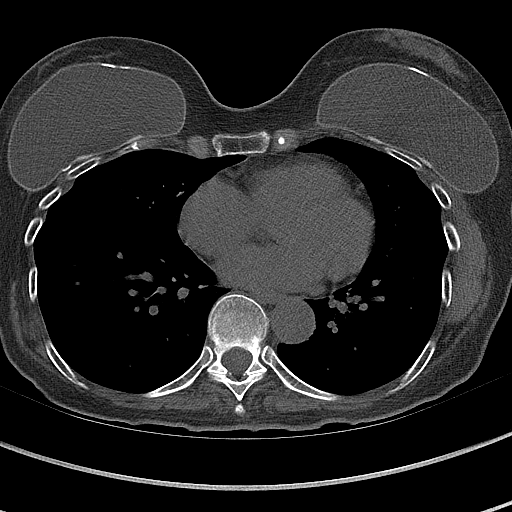
[im 38/57  vessel]
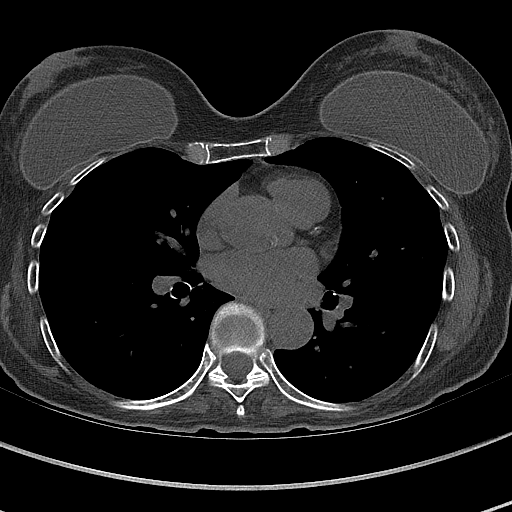
[im 47/57  vessel]
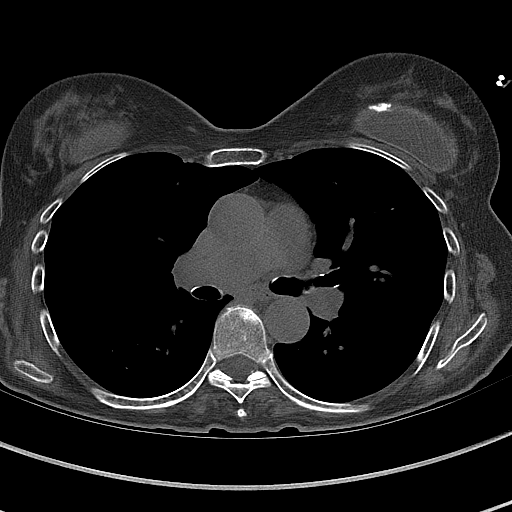

[14 of 20 positions shown; findings below may reference images not displayed]

FINDINGS: Vascular: Small amount of calcium at the aortic root. Normal caliber
of the visualized thoracic aorta.

Mediastinum/Nodes: Visualized mediastinal structures are normal.

Lungs/Pleura: Bandlike density in the left lower lobe is suggestive
for atelectasis or scar. Subpleural nodule in left lower lobe
measures 3 mm on sequence 10, image 18. No airspace disease or
consolidation in the visualized lungs. No large pleural effusions.

Upper Abdomen: Images of the upper abdomen are unremarkable.

Musculoskeletal: Bilateral breast implants. No acute bone
abnormality.
IMPRESSION: 1. No acute extracardiac findings.
2. 3 mm left solid pulmonary nodule. No routine follow-up imaging is
recommended per [HOSPITAL] Guidelines.
These guidelines do not apply to immunocompromised patients and
patients with cancer. Follow up in patients with significant
comorbidities as clinically warranted. For lung cancer screening,
adhere to Lung-RADS guidelines. Reference: Radiology. 9390;
FINDINGS: Non-cardiac: See separate report from [REDACTED].

Ascending Aorta: Normal size, mild aortic root and descending aorta
calcifications

Pericardium: Normal

Coronary arteries: Normal origin of left and right coronary
arteries. Distribution of arterial calcifications if present, as
noted below;

LM 0

LAD 0

LCx 0

RCA 0

Total 0

IMPRESSION AND RECOMMENDATION:
1. Normal coronary calcium score of 0. Patient is low risk for
coronary events.

2.  CAC 0, YONETA RAGIN0.

3.  Continue heart healthy lifestyle and risk factor modification.

*** End of Addendum ***
EXAM:
OVER-READ INTERPRETATION  CT CHEST

The following report is a limited chest CT over-read performed by
This over-read does not include interpretation of cardiac or
coronary anatomy or pathology. The coronary calcium score
interpretation by the cardiologist is attached.
FINDINGS: Vascular: Small amount of calcium at the aortic root. Normal caliber
of the visualized thoracic aorta.

Mediastinum/Nodes: Visualized mediastinal structures are normal.

Lungs/Pleura: Bandlike density in the left lower lobe is suggestive
for atelectasis or scar. Subpleural nodule in left lower lobe
measures 3 mm on sequence 10, image 18. No airspace disease or
consolidation in the visualized lungs. No large pleural effusions.

Upper Abdomen: Images of the upper abdomen are unremarkable.

Musculoskeletal: Bilateral breast implants. No acute bone
abnormality.
IMPRESSION: 1. No acute extracardiac findings.
2. 3 mm left solid pulmonary nodule. No routine follow-up imaging is
recommended per [HOSPITAL] Guidelines.
These guidelines do not apply to immunocompromised patients and
patients with cancer. Follow up in patients with significant
comorbidities as clinically warranted. For lung cancer screening,
adhere to Lung-RADS guidelines. Reference: Radiology. 9390;

## 2023-08-11 ENCOUNTER — Ambulatory Visit (INDEPENDENT_AMBULATORY_CARE_PROVIDER_SITE_OTHER): Payer: Medicare Other | Admitting: *Deleted

## 2023-08-11 VITALS — Ht 63.5 in | Wt 123.0 lb

## 2023-08-11 DIAGNOSIS — Z0000 Encounter for general adult medical examination without abnormal findings: Secondary | ICD-10-CM | POA: Diagnosis not present

## 2023-08-11 DIAGNOSIS — Z1231 Encounter for screening mammogram for malignant neoplasm of breast: Secondary | ICD-10-CM | POA: Diagnosis not present

## 2023-08-11 NOTE — Patient Instructions (Addendum)
 Melody Hansen , Thank you for taking time to come for your Medicare Wellness Visit. I appreciate your ongoing commitment to your health goals. Please review the following plan we discussed and let me know if I can assist you in the future.   Referrals/Orders/Follow-Ups/Clinician Recommendations: Remember to keep your appointment for your flu shot. Your mammogram has been ordered. Remember to update your other vaccines as discussed. Call and schedule your eye exam.  You have an order for:  []   2D Mammogram  [x]   3D Mammogram  []   Bone Density     Please call for appointment:  [facility]  [address] [place] [address] [phone]   Make sure to wear two-piece clothing.  No lotions, powders, or deodorants the day of the appointment. Make sure to bring picture ID and insurance card.  Bring list of medications you are currently taking including any supplements.   Schedule your [place] screening mammogram through MyChart!   Log into your MyChart account.  Go to 'Visit' (or 'Appointments' if on mobile App) --> Schedule an Appointment  Under 'Select a Reason for Visit' choose the Mammogram Screening option.  Complete the pre-visit questions and select the time and place that best fits your schedule.    This is a list of the screening recommended for you and due dates:  Health Maintenance  Topic Date Due   Zoster (Shingles) Vaccine (1 of 2) Never done   Pneumonia Vaccine (1 of 1 - PCV) Never done   COVID-19 Vaccine (4 - 2024-25 season) 02/16/2023   Flu Shot  09/15/2023*   DTaP/Tdap/Td vaccine (2 - Td or Tdap) 03/11/2024   Mammogram  06/27/2024   Medicare Annual Wellness Visit  08/10/2024   Cologuard (Stool DNA test)  03/09/2026   DEXA scan (bone density measurement)  Completed   Hepatitis C Screening  Completed   HPV Vaccine  Aged Out   Colon Cancer Screening  Discontinued  *Topic was postponed. The date shown is  not the original due date.    Advanced directives: (Declined) Advance directive discussed with you today. Even though you declined this today, please call our office should you change your mind, and we can give you the proper paperwork for you to fill out.  Next Medicare Annual Wellness Visit scheduled for next year: Yes 08/13/24 @ 9:30

## 2023-08-11 NOTE — Progress Notes (Signed)
 Subjective:   Melody Hansen is a 68 y.o. who presents for a Medicare Wellness preventive visit.  Visit Complete: Virtual I connected with  Veatrice Kells on 08/11/23 by a audio enabled telemedicine application and verified that I am speaking with the correct person using two identifiers.  Patient Location: Home  Provider Location: Office/Clinic  I discussed the limitations of evaluation and management by telemedicine. The patient expressed understanding and agreed to proceed.  Vital Signs: Because this visit was a virtual/telehealth visit, some criteria may be missing or patient reported. Any vitals not documented were not able to be obtained and vitals that have been documented are patient reported.  VideoDeclined- This patient declined Librarian, academic. Therefore the visit was completed with audio only.  AWV Questionnaire: No: Patient Medicare AWV questionnaire was not completed prior to this visit.  Cardiac Risk Factors include: advanced age (>88men, >82 women);dyslipidemia     Objective:    Today's Vitals   08/11/23 1016  Weight: 123 lb (55.8 kg)  Height: 5' 3.5" (1.613 m)   Body mass index is 21.45 kg/m.     08/11/2023   10:29 AM 08/09/2022   10:37 AM 08/06/2021    1:26 PM 08/03/2020    1:19 PM 08/03/2019    1:19 PM 07/17/2017    3:53 PM 04/16/2016    4:58 PM  Advanced Directives  Does Patient Have a Medical Advance Directive? No No No No No No No  Would patient like information on creating a medical advance directive? No - Patient declined No - Patient declined No - Patient declined Yes (MAU/Ambulatory/Procedural Areas - Information given) No - Patient declined No - Patient declined     Current Medications (verified) Outpatient Encounter Medications as of 08/11/2023  Medication Sig   ALPRAZolam (XANAX) 0.25 MG tablet Take 1 tablet (0.25 mg total) by mouth daily as needed for anxiety.   atomoxetine (STRATTERA) 100 MG capsule Take  100 mg by mouth every morning.   fluticasone (FLONASE) 50 MCG/ACT nasal spray Place 2 sprays into both nostrils daily.   Loratadine (ALAVERT PO) Take 1 tablet by mouth as needed.   naratriptan (AMERGE) 2.5 MG tablet TAKE 1 TABLET BY MOUTH DAILY AS NEEDED FOR MIGRAINE. MAY REPEAT ONCE IN 4 HOURS IF NEEDED. MAX 2/DAY   No facility-administered encounter medications on file as of 08/11/2023.    Allergies (verified) Penicillins   History: Past Medical History:  Diagnosis Date   Attention or concentration deficit 06/27/2021   Cervicalgia 04/21/2015   Closed fracture of multiple ribs of right side with delayed healing 05/23/2016   Hyperlipidemia    Impingement syndrome of right shoulder 02/23/2016   Migraine headache 11/20/2016   Osteoporosis 04/15/2020   Bone Density scores received, she has bone loss which is significant  I recommend treating with medication but would need to discuss the pros and cons in an office visit continue calcium 1200 to 1800 mg daily through diet and supplements ,  2000 units of vitamin d and weight bearing exercise on a regular basis, and make appt to discuss    PMB (postmenopausal bleeding) 06/23/2019   Postmenopausal atrophic vaginitis 08/23/2017   Postural dizziness with presyncope 08/05/2022   PSVT (paroxysmal supraventricular tachycardia) (HCC)    Recurrent headache 07/26/2017   Shingles 07/05/2014   Streptococcal pharyngitis 08/05/2022   Urethral polyp 06/23/2019   Vitamin D deficiency 05/23/2016   Past Surgical History:  Procedure Laterality Date   APPENDECTOMY     AUGMENTATION MAMMAPLASTY  Bilateral    25 years ago   BREAST ENHANCEMENT SURGERY     CARDIAC ELECTROPHYSIOLOGY STUDY AND ABLATION  March 2015   [place], [place] for SVT    OVARIAN CYST REMOVAL     removal calcification right sternoclavicular joint     TONSILLECTOMY     Family History  Problem Relation Age of Onset   Heart attack Mother    COPD Mother    Stroke Father    Heart  disease Father 62   Hyperlipidemia Father    COPD Father    Hyperlipidemia Brother    Obesity Brother    Hyperlipidemia Other        family history   Breast cancer Neg Hx    Social History   Socioeconomic History   Marital status: Married    Spouse name: Not on file   Number of children: Not on file   Years of education: 12   Highest education level: High school graduate  Occupational History   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  Tobacco Use   Smoking status: Never   Smokeless tobacco: Never  Substance and Sexual Activity   Alcohol use: Yes    Alcohol/week: 4.0 - 8.0 standard drinks of alcohol    Types: 4 - 8 Glasses of wine per week    Comment: 1-2 glasses of wine most nights   Drug use: No   Sexual activity: Not on file  Other Topics Concern   Not on file  Social History Narrative   Not on file   Social Drivers of Health   Financial Resource Strain: Low Risk  (08/11/2023)   Overall Financial Resource Strain (CARDIA)    Difficulty of Paying Living Expenses: Not hard at all  Food Insecurity: No Food Insecurity (08/11/2023)   Hunger Vital Sign    Worried About Running Out of Food in the Last Year: Never true    Ran Out of Food in the Last Year: Never true  Transportation Needs: No Transportation Needs (08/11/2023)   PRAPARE - Administrator, Civil Service (Medical): No    Lack of Transportation (Non-Medical): No  Physical Activity: Sufficiently Active (08/11/2023)   Exercise Vital Sign    Days of Exercise per Week: 4 days    Minutes of Exercise per Session: 90 min  Stress: No Stress Concern Present (08/11/2023)   [employer] of Occupational Health - Occupational Stress Questionnaire    Feeling of Stress : Not at all  Social Connections: Moderately Integrated (08/11/2023)   Social Connection and Isolation Panel [NHANES]    Frequency of Communication with Friends and Family: More than three times a week    Frequency of Social  Gatherings with Friends and Family: More than three times a week    Attends Religious Services: Never    Database administrator or Organizations: Yes    Attends Engineer, structural: More than 4 times per year    Marital Status: Married    Tobacco Counseling Counseling given: Not Answered    Clinical Intake:  Pre-visit preparation completed: Yes  Pain : No/denies pain     BMI - recorded: 21.45 Nutritional Status: BMI of 19-24  Normal Nutritional Risks: None Diabetes: No  How often do you need to have someone help you when you read instructions, pamphlets, or other written materials from your doctor or pharmacy?: 1 - Never  Interpreter Needed?: No  Information entered by :: R. Andersyn Fragoso LPN  Activities of Daily Living     08/11/2023   10:18 AM  In your present state of health, do you have any difficulty performing the following activities:  Hearing? 1  Comment at times in large crowd  Vision? 0  Comment readers at times  Difficulty concentrating or making decisions? 0  Walking or climbing stairs? 0  Dressing or bathing? 0  Doing errands, shopping? 0  Preparing Food and eating ? N  Using the Toilet? N  In the past six months, have you accidently leaked urine? N  Do you have problems with loss of bowel control? N  Managing your Medications? N  Managing your Finances? N  Housekeeping or managing your Housekeeping? N    Patient Care Team: Sherlene Shams, MD as PCP - General (Internal Medicine) Iran Ouch, MD as PCP - Cardiology (Cardiology)  Indicate any recent Medical Services you may have received from other than [facility] providers in the past year (date may be approximate).     Assessment:   This is a routine wellness examination for Alieyah.  Hearing/Vision screen Hearing Screening - Comments:: Some issues Vision Screening - Comments:: Readers at times   Goals Addressed             This Visit's Progress    Patient Stated       Wants  to travel more       Depression Screen     08/11/2023   10:24 AM 03/03/2023   11:38 AM 10/15/2022    2:16 PM 08/12/2022    4:40 PM 08/09/2022   10:39 AM 08/05/2022    5:36 PM 01/01/2022    1:55 PM  PHQ 2/9 Scores  PHQ - 2 Score 0 0 0 0 0 0 0  PHQ- 9 Score 0  0        Fall Risk     08/11/2023   10:20 AM 03/03/2023   11:37 AM 10/15/2022    2:16 PM 08/12/2022    4:40 PM 08/09/2022   10:38 AM  Fall Risk   Falls in the past year? 0 0 0 0 0  Number falls in past yr: 0 0 0 0 0  Injury with Fall? 0 0 0 0 0  Risk for fall due to : No Fall Risks No Fall Risks No Fall Risks No Fall Risks   Follow up Falls prevention discussed;Falls evaluation completed Falls evaluation completed Falls evaluation completed Falls evaluation completed Falls evaluation completed;Falls prevention discussed    MEDICARE RISK AT HOME:  Medicare Risk at Home Any stairs in or around the home?: Yes If so, are there any without handrails?: No Home free of loose throw rugs in walkways, pet beds, electrical cords, etc?: Yes Adequate lighting in your home to reduce risk of falls?: Yes Life alert?: No Use of a cane, walker or w/c?: No Grab bars in the bathroom?: Yes Shower chair or bench in shower?: No Elevated toilet seat or a handicapped toilet?: Yes  TIMED UP AND GO:  Was the test performed?  No  Cognitive Function: 6CIT completed        08/11/2023   10:30 AM 08/09/2022   10:49 AM 08/03/2019    1:27 PM  6CIT Screen  What Year? 0 points 0 points 0 points  What month? 0 points 0 points 0 points  What time? 0 points 0 points 0 points  Count back from 20 0 points 0 points 0 points  Months in reverse 0 points 0  points 0 points  Repeat phrase 0 points 0 points 0 points  Total Score 0 points 0 points 0 points    Immunizations Immunization History  Administered Date(s) Administered   Fluad Quad(high Dose 65+) 02/21/2016   Influenza, High Dose Seasonal PF 04/09/2019, 04/12/2021   Influenza-Unspecified  03/11/2014, 03/08/2015, 02/21/2016, 03/22/2016, 03/31/2017, 03/31/2020, 05/20/2022   PFIZER(Purple Top)SARS-COV-2 Vaccination 08/27/2019, 09/23/2019, 06/14/2020   Tdap 03/11/2014    Screening Tests Health Maintenance  Topic Date Due   Zoster Vaccines- Shingrix (1 of 2) Never done   Pneumonia Vaccine 16+ Years old (1 of 1 - PCV) Never done   COVID-19 Vaccine (4 - 2024-25 season) 02/16/2023   INFLUENZA VACCINE  09/15/2023 (Originally 01/16/2023)   DTaP/Tdap/Td (2 - Td or Tdap) 03/11/2024   MAMMOGRAM  06/27/2024   Medicare Annual Wellness (AWV)  08/10/2024   Fecal DNA (Cologuard)  03/09/2026   DEXA SCAN  Completed   Hepatitis C Screening  Completed   HPV VACCINES  Aged Out   Colonoscopy  Discontinued    Health Maintenance  Health Maintenance Due  Topic Date Due   Zoster Vaccines- Shingrix (1 of 2) Never done   Pneumonia Vaccine 89+ Years old (1 of 1 - PCV) Never done   COVID-19 Vaccine (4 - 2024-25 season) 02/16/2023   Health Maintenance Items Addressed: Mammogram ordered Flu shot scheduled 08/15/23 Information given to patient regarding pneumonia and shingles vaccines.  Additional Screening:  Vision Screening: Recommended annual ophthalmology exams for early detection of glaucoma and other disorders of the eye. Not up to date. Patient stated that she will call [facility] and schedule an appointment  Dental Screening: Recommended annual dental exams for proper oral hygiene  Community Resource Referral / Chronic Care Management: CRR required this visit?  No   CCM required this visit?  No     Plan:     I have personally reviewed and noted the following in the patient's chart:   Medical and social history Use of alcohol, tobacco or illicit drugs  Current medications and supplements including opioid prescriptions. Patient is not currently taking opioid prescriptions. Functional ability and status Nutritional status Physical activity Advanced directives List of  other physicians Hospitalizations, surgeries, and ER visits in previous 12 months Vitals Screenings to include cognitive, depression, and falls Referrals and appointments  In addition, I have reviewed and discussed with patient certain preventive protocols, quality metrics, and best practice recommendations. A written personalized care plan for preventive services as well as general preventive health recommendations were provided to patient.     Sydell Axon, LPN   12/14/5282   After Visit Summary: (MyChart) Due to this being a telephonic visit, the after visit summary with patients personalized plan was offered to patient via MyChart   Notes: Nothing significant to report at this time.

## 2023-08-12 DIAGNOSIS — S52501A Unspecified fracture of the lower end of right radius, initial encounter for closed fracture: Secondary | ICD-10-CM | POA: Diagnosis not present

## 2023-08-15 ENCOUNTER — Ambulatory Visit: Payer: Medicare Other

## 2023-08-15 ENCOUNTER — Telehealth: Payer: Self-pay

## 2023-08-15 NOTE — Telephone Encounter (Signed)
 Copied from CRM [id number]. Topic: General - Other >> Aug 15, 2023  1:48 PM Chantha C wrote: Reason for CRM: Patient fell playing Pickle ball last Tuesday, broke right wrist.  Patient wanted Dr. Darrick Huntsman to know, FYI.

## 2023-08-18 ENCOUNTER — Encounter: Payer: Self-pay | Admitting: Internal Medicine

## 2023-08-18 DIAGNOSIS — S62101A Fracture of unspecified carpal bone, right wrist, initial encounter for closed fracture: Secondary | ICD-10-CM | POA: Insufficient documentation

## 2023-08-18 NOTE — Telephone Encounter (Signed)
 FYI

## 2023-08-20 DIAGNOSIS — S52501A Unspecified fracture of the lower end of right radius, initial encounter for closed fracture: Secondary | ICD-10-CM | POA: Diagnosis not present

## 2023-09-10 DIAGNOSIS — S52501A Unspecified fracture of the lower end of right radius, initial encounter for closed fracture: Secondary | ICD-10-CM | POA: Diagnosis not present

## 2023-10-08 DIAGNOSIS — S52501A Unspecified fracture of the lower end of right radius, initial encounter for closed fracture: Secondary | ICD-10-CM | POA: Diagnosis not present

## 2023-10-13 ENCOUNTER — Encounter: Payer: Self-pay | Admitting: Internal Medicine

## 2023-10-13 ENCOUNTER — Ambulatory Visit (INDEPENDENT_AMBULATORY_CARE_PROVIDER_SITE_OTHER): Admitting: Internal Medicine

## 2023-10-13 VITALS — BP 96/60 | HR 76 | Temp 98.2°F | Ht 63.5 in | Wt 125.4 lb

## 2023-10-13 DIAGNOSIS — M2550 Pain in unspecified joint: Secondary | ICD-10-CM | POA: Insufficient documentation

## 2023-10-13 DIAGNOSIS — R197 Diarrhea, unspecified: Secondary | ICD-10-CM

## 2023-10-13 DIAGNOSIS — M130 Polyarthritis, unspecified: Secondary | ICD-10-CM | POA: Diagnosis not present

## 2023-10-13 LAB — COMPREHENSIVE METABOLIC PANEL WITH GFR
ALT: 21 U/L (ref 0–35)
AST: 23 U/L (ref 0–37)
Albumin: 4.4 g/dL (ref 3.5–5.2)
Alkaline Phosphatase: 55 U/L (ref 39–117)
BUN: 15 mg/dL (ref 6–23)
CO2: 30 meq/L (ref 19–32)
Calcium: 9.8 mg/dL (ref 8.4–10.5)
Chloride: 102 meq/L (ref 96–112)
Creatinine, Ser: 0.5 mg/dL (ref 0.40–1.20)
GFR: 95.17 mL/min (ref >60.00)
Glucose, Bld: 93 mg/dL (ref 70–99)
Potassium: 4.4 meq/L (ref 3.5–5.1)
Sodium: 138 meq/L (ref 135–145)
Total Bilirubin: 0.5 mg/dL (ref 0.2–1.2)
Total Protein: 6.9 g/dL (ref 6.0–8.3)

## 2023-10-13 LAB — CBC WITH DIFFERENTIAL/PLATELET
Basophils Absolute: 0.1 10*3/uL (ref 0.0–0.1)
Basophils Relative: 1 % (ref 0.0–3.0)
Eosinophils Absolute: 0.1 10*3/uL (ref 0.0–0.7)
Eosinophils Relative: 2 % (ref 0.0–5.0)
HCT: 40.9 % (ref 36.0–46.0)
Hemoglobin: 13.4 g/dL (ref 12.0–15.0)
Lymphocytes Relative: 23.4 % (ref 12.0–46.0)
Lymphs Abs: 1.5 10*3/uL (ref 0.7–4.0)
MCHC: 32.8 g/dL (ref 30.0–36.0)
MCV: 85.9 fl (ref 78.0–100.0)
Monocytes Absolute: 0.5 10*3/uL (ref 0.1–1.0)
Monocytes Relative: 7.5 % (ref 3.0–12.0)
Neutro Abs: 4.3 10*3/uL (ref 1.4–7.7)
Neutrophils Relative %: 66.1 % (ref 43.0–77.0)
Platelets: 251 10*3/uL (ref 150.0–400.0)
RBC: 4.76 Mil/uL (ref 3.87–5.11)
RDW: 14 % (ref 11.5–15.5)
WBC: 6.4 10*3/uL (ref 4.0–10.5)

## 2023-10-13 LAB — C-REACTIVE PROTEIN: CRP: <1 mg/dL (ref 0.5–20.0)

## 2023-10-13 LAB — SEDIMENTATION RATE: Sed Rate: 1 mm/h (ref 0–30)

## 2023-10-13 MED ORDER — RALOXIFENE HCL 60 MG PO TABS: 60.0000 mg | ORAL_TABLET | Freq: Every day | ORAL | 1 refills | Status: DC

## 2023-10-13 NOTE — Progress Notes (Signed)
 Subjective:  Patient ID: Melody Hansen, female    DOB: Jul 12, 1955  Age: 68 y.o. MRN: [id number]  CC: The primary encounter diagnosis was Diarrhea of presumed infectious origin. A diagnosis of Polyarthritis was also pertinent to this visit.   HPI [name] presents for  Chief Complaint  Patient presents with  . stomach issues   NEW ONSET LOWER ABD PAIN AND FREQUENT LOOSE STOOLS OCCURRING DAILY FOR THE PAST 3-4 WEEKS. .  Cramping continues after bedtime but no nocturnal stools. No hematochezia or fevers,  stools are slimy and mucoid  and yellow ,  but morre achey since it started,   got a  NEW DOG ON FEB 12 th.   RIGHT WRIST  FRACTURE IN FEBRUARY PLAYING PICKLE BALL .  No ROM  issue . LAST dexa 2023   Joint pain: Inactivity causes  stiffness of all joints. Oints feel  Fine in the morning  but  right knee aches after sitting for extended periods of time.     Outpatient Medications Prior to Visit  Medication Sig Dispense Refill  . ALPRAZolam  (XANAX ) 0.25 MG tablet Take 1 tablet (0.25 mg total) by mouth daily as needed for anxiety. 20 tablet 0  . atomoxetine (STRATTERA) 100 MG capsule Take 100 mg by mouth every morning.    . fluticasone  (FLONASE ) 50 MCG/ACT nasal spray Place 2 sprays into both nostrils daily. 16 g 0  . Loratadine (ALAVERT PO) Take 1 tablet by mouth as needed.    . naratriptan  (AMERGE) 2.5 MG tablet TAKE 1 TABLET BY MOUTH DAILY AS NEEDED FOR MIGRAINE. MAY REPEAT ONCE IN 4 HOURS IF NEEDED. MAX 2/DAY 10 tablet 5   No facility-administered medications prior to visit.    Review of Systems;  Patient denies headache, fevers, malaise, unintentional weight loss, skin rash, eye pain, sinus congestion and sinus pain, sore throat, dysphagia,  hemoptysis , cough, dyspnea, wheezing, chest pain, palpitations, orthopnea, edema, abdominal pain, nausea, melena, constipation, flank pain, dysuria, hematuria, urinary  Frequency, nocturia, numbness, tingling, seizures,  Focal  weakness, Loss of consciousness,  Tremor, insomnia, depression, anxiety, and suicidal ideation.      Objective:  BP 96/60   Pulse 76   Temp 98.2 F (36.8 C) (Oral)   Ht 5' 3.5" (1.613 m)   Wt 125 lb 6.4 oz (56.9 kg)   SpO2 98%   BMI 21.87 kg/m   BP Readings from Last 3 Encounters:  10/13/23 96/60  03/03/23 92/68  10/15/22 116/74    Wt Readings from Last 3 Encounters:  10/13/23 125 lb 6.4 oz (56.9 kg)  08/11/23 123 lb (55.8 kg)  03/03/23 126 lb (57.2 kg)    Physical Exam Vitals reviewed.  Constitutional:      General: She is not in acute distress.    Appearance: Normal appearance. She is well-developed and normal weight. She is not ill-appearing, toxic-appearing or diaphoretic.  HENT:     Head: Normocephalic.     Right Ear: Tympanic membrane, ear canal and external ear normal. There is no impacted cerumen.     Left Ear: Tympanic membrane, ear canal and external ear normal. There is no impacted cerumen.     Nose: Nose normal.     Mouth/Throat:     Mouth: Mucous membranes are moist.     Pharynx: Oropharynx is clear.  Eyes:     General: No scleral icterus.       Right eye: No discharge.        Left eye: No  discharge.     Conjunctiva/sclera: Conjunctivae normal.     Pupils: Pupils are equal, round, and reactive to light.  Neck:     Thyroid : No thyromegaly.     Vascular: No carotid bruit or JVD.  Cardiovascular:     Rate and Rhythm: Normal rate and regular rhythm.     Heart sounds: Normal heart sounds.  Pulmonary:     Effort: Pulmonary effort is normal. No respiratory distress.     Breath sounds: Normal breath sounds.  Chest:  Breasts:    Breasts are symmetrical.     Right: Normal. No swelling, inverted nipple, mass, nipple discharge, skin change or tenderness.     Left: Normal. No swelling, inverted nipple, mass, nipple discharge, skin change or tenderness.  Abdominal:     General: Bowel sounds are normal.     Palpations: Abdomen is soft. There is no mass.      Tenderness: There is no abdominal tenderness. There is no guarding or rebound.  Musculoskeletal:        General: Normal range of motion.     Cervical back: Normal range of motion and neck supple.  Lymphadenopathy:     Cervical: No cervical adenopathy.     Upper Body:     Right upper body: No supraclavicular, axillary or pectoral adenopathy.     Left upper body: No supraclavicular, axillary or pectoral adenopathy.  Skin:    General: Skin is warm and dry.  Neurological:     General: No focal deficit present.     Mental Status: She is alert and oriented to person, place, and time. Mental status is at baseline.  Psychiatric:        Mood and Affect: Mood normal.        Behavior: Behavior normal.        Thought Content: Thought content normal.        Judgment: Judgment normal.   Lab Results  Component Value Date   HGBA1C 5.6 03/03/2023   HGBA1C 5.8 01/01/2022   HGBA1C 5.5 05/22/2016    Lab Results  Component Value Date   CREATININE 0.50 10/13/2023   CREATININE 0.53 03/03/2023   CREATININE 0.57 08/15/2022    Lab Results  Component Value Date   WBC 6.4 10/13/2023   HGB 13.4 10/13/2023   HCT 40.9 10/13/2023   PLT 251.0 10/13/2023   GLUCOSE 93 10/13/2023   CHOL 273 (H) 03/03/2023   TRIG 175.0 (H) 03/03/2023   HDL 98.90 03/03/2023   LDLDIRECT 160.0 03/03/2023   LDLCALC 139 (H) 03/03/2023   ALT 21 10/13/2023   AST 23 10/13/2023   NA 138 10/13/2023   K 4.4 10/13/2023   CL 102 10/13/2023   CREATININE 0.50 10/13/2023   BUN 15 10/13/2023   CO2 30 10/13/2023   TSH 0.57 03/03/2023   INR 0.9 06/23/2019   HGBA1C 5.6 03/03/2023    CT ABDOMEN PELVIS WO CONTRAST Result Date: 03/24/2023 CLINICAL DATA:  Abdominal pain, kidney stones EXAM: CT ABDOMEN AND PELVIS WITHOUT CONTRAST TECHNIQUE: Multidetector CT imaging of the abdomen and pelvis was performed following the standard protocol without IV contrast. RADIATION DOSE REDUCTION: This exam was performed according to the  departmental dose-optimization program which includes automated exposure control, adjustment of the mA and/or kV according to patient size and/or use of iterative reconstruction technique. COMPARISON:  None Available. FINDINGS: Lower chest: Linear scarring in the left lower lobe. Bilateral breast augmentation. Hepatobiliary: Unenhanced liver is unremarkable. Layering gallbladder sludge versus noncalcified gallstones (series 2/image  32), without associated inflammatory changes. No intrahepatic or extrahepatic ductal dilatation. Pancreas: Within normal limits. Spleen: Within normal limits Adrenals/Urinary Tract: Adrenal glands are within normal limits. Kidneys are within normal limits. No renal, ureteral, or bladder calculi. No hydronephrosis. Low lying bladder, otherwise within normal limits. Stomach/Bowel: Stomach is within normal limits. No evidence of bowel obstruction. Appendix is not discretely visualized. No colonic wall thickening or inflammatory changes. Vascular/Lymphatic: No evidence of abdominal aortic aneurysm. Atherosclerotic calcifications of the abdominal aorta and branch vessels. No suspicious abdominopelvic lymphadenopathy. Reproductive: Uterus is within normal limits. Bilateral ovaries are within normal limits. Other: No abdominopelvic ascites. Musculoskeletal: Visualized osseous structures are within normal limits. IMPRESSION: No renal, ureteral, or bladder calculi.  No hydronephrosis.  Fall Layering gallbladder sludge versus noncalcified gallstones, without associated inflammatory changes. No CT findings to account for the patient's abdominal pain. Electronically Signed   By: Zadie Herter M.D.   On: 03/24/2023 14:16    Assessment & Plan:  .Diarrhea of presumed infectious origin Assessment & Plan: Given her new puppy,  suspect parasites .  Workup pending   Orders: -     Comprehensive metabolic panel with GFR -     CBC with Differential/Platelet -     Ova and parasite examination;  Future -     GI pathogen panel by PCR, stool; Future -     Stool culture; Future  Polyarthritis Assessment & Plan: Screening for autoimmune disease in progress   Orders: -     Sedimentation rate -     C-reactive protein -     Rheumatoid factor  Other orders -     Raloxifene  HCl; Take 1 tablet (60 mg total) by mouth daily.  Dispense: 90 tablet; Refill: 1     I spent 34 minutes on the day of this face to face encounter reviewing patient's  most recent visit with cardiology,  nephrology,  and neurology,  prior relevant surgical and non surgical procedures, recent  labs and imaging studies, counseling on weight management,  reviewing the assessment and plan with patient, and post visit ordering and reviewing of  diagnostics and therapeutics with patient  .   Follow-up: No follow-ups on file.   Thersia Flax, MD

## 2023-10-13 NOTE — Patient Instructions (Signed)
 I agree that stool samples to rule out parasites are needed

## 2023-10-13 NOTE — Assessment & Plan Note (Signed)
 Given her new puppy,  suspect parasites .  Workup pending

## 2023-10-13 NOTE — Assessment & Plan Note (Signed)
 Screening for autoimmune disease in progress

## 2023-10-14 ENCOUNTER — Other Ambulatory Visit
Admission: RE | Admit: 2023-10-14 | Discharge: 2023-10-14 | Disposition: A | Discharge status: Home / Self Care | Source: Ambulatory Visit | Attending: Internal Medicine | Admitting: Internal Medicine

## 2023-10-14 ENCOUNTER — Encounter: Payer: Self-pay | Admitting: Internal Medicine

## 2023-10-14 DIAGNOSIS — R197 Diarrhea, unspecified: Secondary | ICD-10-CM | POA: Insufficient documentation

## 2023-10-14 DIAGNOSIS — S52512D Displaced fracture of left radial styloid process, subsequent encounter for closed fracture with routine healing: Secondary | ICD-10-CM | POA: Diagnosis not present

## 2023-10-14 DIAGNOSIS — M6281 Muscle weakness (generalized): Secondary | ICD-10-CM | POA: Diagnosis not present

## 2023-10-14 LAB — GASTROINTESTINAL PANEL BY PCR, STOOL (REPLACES STOOL CULTURE)

## 2023-10-14 LAB — RHEUMATOID FACTOR: Rheumatoid fact SerPl-aCnc: <10 [IU]/mL (ref <14)

## 2023-10-17 ENCOUNTER — Encounter: Payer: Self-pay | Admitting: Internal Medicine

## 2023-10-18 LAB — STOOL CULTURE: E coli, Shiga toxin Assay: NEGATIVE

## 2023-10-18 LAB — STOOL CULTURE REFLEX - CMPCXR

## 2023-10-18 LAB — STOOL CULTURE REFLEX - RSASHR

## 2023-10-20 LAB — OVA + PARASITE EXAM

## 2023-10-20 LAB — O&P RESULT

## 2023-10-21 DIAGNOSIS — M6281 Muscle weakness (generalized): Secondary | ICD-10-CM | POA: Diagnosis not present

## 2023-10-21 DIAGNOSIS — S52512D Displaced fracture of left radial styloid process, subsequent encounter for closed fracture with routine healing: Secondary | ICD-10-CM | POA: Diagnosis not present

## 2023-10-23 ENCOUNTER — Encounter: Payer: Self-pay | Admitting: Internal Medicine

## 2023-10-23 ENCOUNTER — Other Ambulatory Visit: Payer: Self-pay | Admitting: Internal Medicine

## 2023-10-23 DIAGNOSIS — R197 Diarrhea, unspecified: Secondary | ICD-10-CM

## 2023-10-24 ENCOUNTER — Encounter: Payer: Self-pay | Admitting: Internal Medicine

## 2023-10-24 MED ORDER — NARATRIPTAN HCL 2.5 MG PO TABS: ORAL_TABLET | ORAL | 5 refills | Status: AC

## 2023-10-24 NOTE — Telephone Encounter (Signed)
 Pt requesting. Medication has been pended

## 2023-10-26 ENCOUNTER — Other Ambulatory Visit: Payer: Self-pay | Admitting: Internal Medicine

## 2023-10-26 DIAGNOSIS — K529 Noninfective gastroenteritis and colitis, unspecified: Secondary | ICD-10-CM | POA: Insufficient documentation

## 2023-10-26 NOTE — Assessment & Plan Note (Signed)
 Patient's diarrhea is nonbloody and has persisted > 4 weeks without weight loss.  Will initiate workup for osmotic vs secretory causes.  Stool and serum electrolytes ordered for calculation of osmotic gap (290- (2 x (stool Na plus stool K)) with a  gap >100 indicating  osmotic etiology

## 2023-10-28 DIAGNOSIS — M6281 Muscle weakness (generalized): Secondary | ICD-10-CM | POA: Diagnosis not present

## 2023-10-28 DIAGNOSIS — S52512D Displaced fracture of left radial styloid process, subsequent encounter for closed fracture with routine healing: Secondary | ICD-10-CM | POA: Diagnosis not present

## 2023-12-01 DIAGNOSIS — M6281 Muscle weakness (generalized): Secondary | ICD-10-CM | POA: Diagnosis not present

## 2023-12-01 DIAGNOSIS — R293 Abnormal posture: Secondary | ICD-10-CM | POA: Diagnosis not present

## 2023-12-09 DIAGNOSIS — M6281 Muscle weakness (generalized): Secondary | ICD-10-CM | POA: Diagnosis not present

## 2023-12-09 DIAGNOSIS — Z6822 Body mass index (BMI) 22.0-22.9, adult: Secondary | ICD-10-CM | POA: Diagnosis not present

## 2023-12-09 DIAGNOSIS — L089 Local infection of the skin and subcutaneous tissue, unspecified: Secondary | ICD-10-CM | POA: Diagnosis not present

## 2023-12-09 DIAGNOSIS — S70369A Insect bite (nonvenomous), unspecified thigh, initial encounter: Secondary | ICD-10-CM | POA: Diagnosis not present

## 2023-12-09 DIAGNOSIS — R293 Abnormal posture: Secondary | ICD-10-CM | POA: Diagnosis not present

## 2023-12-09 DIAGNOSIS — S90569A Insect bite (nonvenomous), unspecified ankle, initial encounter: Secondary | ICD-10-CM | POA: Diagnosis not present

## 2023-12-09 DIAGNOSIS — W57XXXA Bitten or stung by nonvenomous insect and other nonvenomous arthropods, initial encounter: Secondary | ICD-10-CM | POA: Diagnosis not present

## 2023-12-09 DIAGNOSIS — S70269A Insect bite (nonvenomous), unspecified hip, initial encounter: Secondary | ICD-10-CM | POA: Diagnosis not present

## 2023-12-09 DIAGNOSIS — S80869A Insect bite (nonvenomous), unspecified lower leg, initial encounter: Secondary | ICD-10-CM | POA: Diagnosis not present

## 2023-12-22 DIAGNOSIS — M6281 Muscle weakness (generalized): Secondary | ICD-10-CM | POA: Diagnosis not present

## 2023-12-22 DIAGNOSIS — R293 Abnormal posture: Secondary | ICD-10-CM | POA: Diagnosis not present

## 2023-12-29 DIAGNOSIS — M6281 Muscle weakness (generalized): Secondary | ICD-10-CM | POA: Diagnosis not present

## 2023-12-29 DIAGNOSIS — R293 Abnormal posture: Secondary | ICD-10-CM | POA: Diagnosis not present

## 2024-01-07 ENCOUNTER — Encounter: Payer: Self-pay | Admitting: Internal Medicine

## 2024-01-08 ENCOUNTER — Encounter: Payer: Self-pay | Admitting: Internal Medicine

## 2024-01-13 DIAGNOSIS — S39012A Strain of muscle, fascia and tendon of lower back, initial encounter: Secondary | ICD-10-CM | POA: Diagnosis not present

## 2024-01-13 DIAGNOSIS — M5459 Other low back pain: Secondary | ICD-10-CM | POA: Diagnosis not present

## 2024-01-13 DIAGNOSIS — M6281 Muscle weakness (generalized): Secondary | ICD-10-CM | POA: Diagnosis not present

## 2024-01-13 DIAGNOSIS — R293 Abnormal posture: Secondary | ICD-10-CM | POA: Diagnosis not present

## 2024-01-19 DIAGNOSIS — M5459 Other low back pain: Secondary | ICD-10-CM | POA: Diagnosis not present

## 2024-01-19 DIAGNOSIS — S39012A Strain of muscle, fascia and tendon of lower back, initial encounter: Secondary | ICD-10-CM | POA: Diagnosis not present

## 2024-02-09 ENCOUNTER — Encounter: Payer: Self-pay | Admitting: Internal Medicine

## 2024-02-12 MED ORDER — PREDNISONE 10 MG PO TABS: ORAL_TABLET | ORAL | 0 refills | Status: DC

## 2024-02-12 MED ORDER — AZITHROMYCIN 500 MG PO TABS: 500.0000 mg | ORAL_TABLET | Freq: Every day | ORAL | 0 refills | Status: DC

## 2024-02-13 NOTE — Telephone Encounter (Signed)
 If this is okay I will print them off and have them in quick sign folder for signature.

## 2024-02-17 MED ORDER — ESCITALOPRAM OXALATE 5 MG PO TABS: 5.0000 mg | ORAL_TABLET | Freq: Every day | ORAL | 0 refills | Status: DC

## 2024-02-17 MED ORDER — RALOXIFENE HCL 60 MG PO TABS: 60.0000 mg | ORAL_TABLET | Freq: Every day | ORAL | 0 refills | Status: DC

## 2024-02-17 NOTE — Telephone Encounter (Signed)
 Rxs have been printed and placed in quick sign folder.

## 2024-02-17 NOTE — Addendum Note (Signed)
 Addended by: HARRIETTE RAISIN on: 02/17/2024 01:17 PM   Modules accepted: Orders

## 2024-03-04 DIAGNOSIS — H04123 Dry eye syndrome of bilateral lacrimal glands: Secondary | ICD-10-CM | POA: Diagnosis not present

## 2024-03-04 DIAGNOSIS — H16142 Punctate keratitis, left eye: Secondary | ICD-10-CM | POA: Diagnosis not present

## 2024-03-09 ENCOUNTER — Encounter

## 2024-03-16 ENCOUNTER — Other Ambulatory Visit: Payer: Self-pay | Admitting: Internal Medicine

## 2024-03-16 ENCOUNTER — Ambulatory Visit
Admission: RE | Admit: 2024-03-16 | Discharge: 2024-03-16 | Disposition: A | Discharge status: Home / Self Care | Source: Ambulatory Visit | Attending: Internal Medicine | Admitting: Internal Medicine

## 2024-03-16 DIAGNOSIS — Z1231 Encounter for screening mammogram for malignant neoplasm of breast: Secondary | ICD-10-CM | POA: Insufficient documentation

## 2024-04-16 ENCOUNTER — Other Ambulatory Visit: Payer: Self-pay | Admitting: Internal Medicine

## 2024-04-22 ENCOUNTER — Encounter: Payer: Self-pay | Admitting: Cardiovascular Disease

## 2024-04-22 ENCOUNTER — Ambulatory Visit: Attending: Cardiovascular Disease | Admitting: Cardiovascular Disease

## 2024-04-22 ENCOUNTER — Other Ambulatory Visit
Admission: RE | Admit: 2024-04-22 | Discharge: 2024-04-22 | Disposition: A | Discharge status: Home / Self Care | Source: Ambulatory Visit | Attending: Cardiovascular Disease | Admitting: Cardiovascular Disease

## 2024-04-22 ENCOUNTER — Ambulatory Visit: Payer: Self-pay | Admitting: Cardiovascular Disease

## 2024-04-22 VITALS — BP 112/64 | HR 77 | Ht 63.5 in | Wt 132.2 lb

## 2024-04-22 DIAGNOSIS — E785 Hyperlipidemia, unspecified: Secondary | ICD-10-CM

## 2024-04-22 DIAGNOSIS — I200 Unstable angina: Secondary | ICD-10-CM

## 2024-04-22 DIAGNOSIS — R002 Palpitations: Secondary | ICD-10-CM | POA: Diagnosis not present

## 2024-04-22 DIAGNOSIS — R072 Precordial pain: Secondary | ICD-10-CM

## 2024-04-22 LAB — CBC
HCT: 41.2 % (ref 36.0–46.0)
Hemoglobin: 13.5 g/dL (ref 12.0–15.0)
MCH: 28.5 pg (ref 26.0–34.0)
MCHC: 32.8 g/dL (ref 30.0–36.0)
MCV: 86.9 fL (ref 80.0–100.0)
Platelets: 224 K/uL (ref 150–400)
RBC: 4.74 MIL/uL (ref 3.87–5.11)
RDW: 13.8 % (ref 11.5–15.5)
WBC: 7.7 K/uL (ref 4.0–10.5)
nRBC: 0 % (ref 0.0–0.2)

## 2024-04-22 LAB — COMPREHENSIVE METABOLIC PANEL WITH GFR
ALT: 18 U/L (ref 0–44)
AST: 24 U/L (ref 15–41)
Albumin: 4 g/dL (ref 3.5–5.0)
Alkaline Phosphatase: 49 U/L (ref 38–126)
Anion gap: 8 (ref 5–15)
BUN: 22 mg/dL (ref 8–23)
CO2: 30 mmol/L (ref 22–32)
Calcium: 9.5 mg/dL (ref 8.9–10.3)
Chloride: 102 mmol/L (ref 98–111)
Creatinine, Ser: 0.56 mg/dL (ref 0.44–1.00)
GFR, Estimated: >60 mL/min (ref >60)
Glucose, Bld: 106 mg/dL — ABNORMAL HIGH (ref 70–99)
Potassium: 4.4 mmol/L (ref 3.5–5.1)
Sodium: 140 mmol/L (ref 135–145)
Total Bilirubin: 0.8 mg/dL (ref 0.0–1.2)
Total Protein: 7 g/dL (ref 6.5–8.1)

## 2024-04-22 LAB — D-DIMER, QUANTITATIVE: D-Dimer, Quant: <0.27 ug{FEU}/mL (ref 0.00–0.50)

## 2024-04-22 LAB — LIPID PANEL
Cholesterol: 250 mg/dL — ABNORMAL HIGH (ref 0–200)
HDL: 96 mg/dL (ref >40)
LDL Cholesterol: 137 mg/dL — ABNORMAL HIGH (ref 0–99)
Total CHOL/HDL Ratio: 2.6 ratio
Triglycerides: 86 mg/dL (ref <150)
VLDL: 17 mg/dL (ref 0–40)

## 2024-04-22 LAB — TROPONIN I (HIGH SENSITIVITY): Troponin I (High Sensitivity): 12 ng/L (ref <18)

## 2024-04-22 MED ORDER — PANTOPRAZOLE SODIUM 40 MG PO TBEC: 40.0000 mg | DELAYED_RELEASE_TABLET | Freq: Every day | ORAL | 3 refills | Status: DC

## 2024-04-22 MED ORDER — METOPROLOL TARTRATE 50 MG PO TABS
ORAL_TABLET | ORAL | 0 refills | Status: AC
Start: 2024-04-22 — End: ?

## 2024-04-22 NOTE — Progress Notes (Signed)
 Cardiology Office Note   Date:  04/22/2024   ID:  Melody Hansen, DOB 01-Sep-1955, MRN [id number]  PCP:  Marylynn Verneita CROME, MD  Cardiologist:   Deatrice Cage, MD   Chief Complaint  Patient presents with   Follow-up    Follow up visit. The patient c/o chest pain didn't sleep last due to the chest pain.       History of Present Illness: Melody Hansen is a 68 y.o. female who presents for urgent evaluation regarding chest pain and palpitations.   She has known history of paroxysmal supraventricular tachycardia status post successful ablation in March 2015.   She had CT calcium score in 2023 which was 0. She had worsening palpitations in 2024.  Outpatient monitor showed only 1 run of short SVT.  Echocardiogram showed normal LV systolic function with no significant valvular abnormalities.  She is here for urgent evaluation regarding chest pain.  She had an episode of chest pain described as heaviness and tightness substernally that lasted for about 1 hour.  It was associated with shortness of breath and then she developed tachycardia.  She checked her pulse and her heart rate was 124 bpm.  She had no heartburn or GI symptoms but does experience occasional heartburn on other days.  She tends to stay active and exercises on a regular basis but has noticed some difficulty with going up stairs lately.  No previous history of ischemic heart disease.  Past Medical History:  Diagnosis Date   Attention or concentration deficit 06/27/2021   Cervicalgia 04/21/2015   Closed fracture of multiple ribs of right side with delayed healing 05/23/2016   Hyperlipidemia    Impingement syndrome of right shoulder 02/23/2016   Migraine headache 11/20/2016   Osteoporosis 04/15/2020   Bone Density scores received, she has bone loss which is significant  I recommend treating with medication but would need to discuss the pros and cons in an office visit continue calcium 1200 to 1800 mg daily through diet  and supplements ,  2000 units of vitamin d  and weight bearing exercise on a regular basis, and make appt to discuss    PMB (postmenopausal bleeding) 06/23/2019   Postmenopausal atrophic vaginitis 08/23/2017   Postural dizziness with presyncope 08/05/2022   PSVT (paroxysmal supraventricular tachycardia)    Recurrent headache 07/26/2017   Shingles 07/05/2014   Streptococcal pharyngitis 08/05/2022   Urethral polyp 06/23/2019   Vitamin D  deficiency 05/23/2016    Past Surgical History:  Procedure Laterality Date   APPENDECTOMY     AUGMENTATION MAMMAPLASTY Bilateral    25 years ago   BREAST ENHANCEMENT SURGERY     CARDIAC ELECTROPHYSIOLOGY STUDY AND ABLATION  March 2015   [place], [place] for SVT    OVARIAN CYST REMOVAL     removal calcification right sternoclavicular joint     TONSILLECTOMY       Current Outpatient Medications  Medication Sig Dispense Refill   ALPRAZolam  (XANAX ) 0.25 MG tablet Take 1 tablet (0.25 mg total) by mouth daily as needed for anxiety. 20 tablet 0   escitalopram  (LEXAPRO ) 5 MG tablet TAKE 1 TABLET (5 MG TOTAL) BY MOUTH DAILY. 30 tablet 1   fluticasone  (FLONASE ) 50 MCG/ACT nasal spray Place 2 sprays into both nostrils daily. 16 g 0   Loratadine (ALAVERT PO) Take 1 tablet by mouth as needed.     naratriptan  (AMERGE) 2.5 MG tablet TAKE 1 TABLET BY MOUTH DAILY AS NEEDED FOR MIGRAINE. MAY REPEAT ONCE IN 4 HOURS IF  NEEDED. MAX 2/DAY 10 tablet 5   raloxifene  (EVISTA ) 60 MG tablet TAKE 1 TABLET BY MOUTH EVERY DAY 90 tablet 1   atomoxetine (STRATTERA) 100 MG capsule Take 100 mg by mouth every morning. (Patient not taking: Reported on 04/22/2024)     azithromycin  (ZITHROMAX ) 500 MG tablet Take 1 tablet (500 mg total) by mouth daily. (Patient not taking: Reported on 04/22/2024) 7 tablet 0   predniSONE  (DELTASONE ) 10 MG tablet 6 tablets on Day 1 , then reduce by 1 tablet daily until gone (Patient not taking: Reported on 04/22/2024) 21 tablet 0   No current  facility-administered medications for this visit.    Allergies:   Penicillins    Social History:  The patient  reports that she has never smoked. She has never used smokeless tobacco. She reports current alcohol use of about 4.0 - 8.0 standard drinks of alcohol per week. She reports that she does not use drugs.   Family History:  The patient's family history includes COPD in her father and mother; Heart attack in her mother; Heart disease (age of onset: 72) in her father; Hyperlipidemia in her brother, father, and another family member; Obesity in her brother; Stroke in her father.    ROS:  Please see the history of present illness.   Otherwise, review of systems are positive for none.   All other systems are reviewed and negative.    PHYSICAL EXAM: VS:  BP 112/64 (BP Location: Left Arm, Patient Position: Sitting, Cuff Size: Normal)   Pulse 77   Ht 5' 3.5 (1.613 m)   Wt 132 lb 3.2 oz (60 kg)   SpO2 97%   BMI 23.05 kg/m  , BMI Body mass index is 23.05 kg/m. GEN: Well nourished, well developed, in no acute distress  HEENT: normal  Neck: no JVD, carotid bruits, or masses Cardiac: RRR; no murmurs, rubs, or gallops,no edema  Respiratory:  clear to auscultation bilaterally, normal work of breathing GI: soft, nontender, nondistended, + BS MS: no deformity or atrophy  Skin: warm and dry, no rash Neuro:  Strength and sensation are intact Psych: euthymic mood, full affect   EKG:  EKG is  ordered today. EKG showed: Normal sinus rhythm Normal ECG       Recent Labs: 10/13/2023: ALT 21; BUN 15; Creatinine, Ser 0.50; Hemoglobin 13.4; Platelets 251.0; Potassium 4.4; Sodium 138    Lipid Panel    Component Value Date/Time   CHOL 273 (H) 03/03/2023 1226   TRIG 175.0 (H) 03/03/2023 1226   HDL 98.90 03/03/2023 1226   CHOLHDL 3 03/03/2023 1226   VLDL 35.0 03/03/2023 1226   LDLCALC 139 (H) 03/03/2023 1226   LDLDIRECT 160.0 03/03/2023 1226      Wt Readings from Last 3 Encounters:   04/22/24 132 lb 3.2 oz (60 kg)  10/13/23 125 lb 6.4 oz (56.9 kg)  08/11/23 123 lb (55.8 kg)          10/30/2021    2:47 PM  PAD Screen  Previous PAD dx? No  Previous surgical procedure? No  Pain with walking? No  Feet/toe relief with dangling? No  Painful, non-healing ulcers? No  Extremities discolored? No      ASSESSMENT AND PLAN:  1.  Possible unstable angina: Prolonged episode of chest tightness and pressure last night.  Fortunately, her EKG today does not show significant ischemic changes.  I am going to obtain stat troponin and D-dimer.  Will also obtain other routine labs. She has known history of  untreated hyperlipidemia with most recent LDL of 160.  Once myocardial infarction is ruled out, I recommend evaluation with cardiac CTA.  2.  Palpitations: Previous history of SVT status post ablation.  Outpatient monitor last year showed only 1 short run of SVT that lasted 5 beats.  Reported heart rate of 124 bpm during the episode of chest pain is likely sinus tachycardia.  3.  Hyperlipidemia: Cardiac CTA will be used for risk stratification and decision making regarding how aggressive we need to be with hyperlipidemia management.  4.  Possible GERD: Will prescribe a PPI for 1 month.   Disposition:   FU to be decided based on the results of labs and cardiac CTA.  Signed,  Deatrice Cage, MD  04/22/2024 10:18 AM     [facility]

## 2024-04-22 NOTE — Patient Instructions (Signed)
 Medication Instructions:  START Protonix 40 mg once daily  *If you need a refill on your cardiac medications before your next appointment, please call your pharmacy*  Lab Work: Your provider would like for you to have the following labs today: Troponin I, CBC, CMET, Lipid, D-Dimer  If you have labs (blood work) drawn today and your tests are completely normal, you will receive your results only by: MyChart Message (if you have MyChart) OR A paper copy in the mail If you have any lab test that is abnormal or we need to change your treatment, we will call you to review the results.  Follow-Up: At [facility], you and your health needs are our priority.  As part of our continuing mission to provide you with exceptional heart care, our providers are all part of one team.  This team includes your primary Cardiologist (physician) and Advanced Practice Providers or APPs (Physician Assistants and Nurse Practitioners) who all work together to provide you with the care you need, when you need it.  Your next appointment:   1 month(s)  Provider:   You may see Deatrice Cage, MD or one of the following Advanced Practice Providers on your designated Care Team:   Lonni Meager, NP Lesley Maffucci, PA-C Bernardino Bring, PA-C Cadence [place], PA-C Tylene Lunch, NP Barnie Hila, NP    We recommend signing up for the patient portal called MyChart.  Sign up information is provided on this After Visit Summary.  MyChart is used to connect with patients for Virtual Visits (Telemedicine).  Patients are able to view lab/test results, encounter notes, upcoming appointments, etc.  Non-urgent messages can be sent to your provider as well.   To learn more about what you can do with MyChart, go to [email or web address].   Other Instructions   Your cardiac CT will be scheduled at one of the below locations:   [facility] [address] [place], [place] [address] [phone]  If scheduled at [facility], please arrive to the Heart and Vascular Center 15 mins early for check-in and test prep.  Please follow these instructions carefully (unless otherwise directed):  An IV will be required for this test and Nitroglycerin will be given.     On the Night Before the Test: Be sure to Drink plenty of water. Do not consume any caffeinated/decaffeinated beverages or chocolate 12 hours prior to your test. Do not take any antihistamines 12 hours prior to your test.   On the Day of the Test: Drink plenty of water until 1 hour prior to the test. Do not eat any food 1 hour prior to test. You may take your regular medications prior to the test.  Take metoprolol  (Lopressor ) two hours prior to test. If you take Furosemide/Hydrochlorothiazide/Spironolactone/Chlorthalidone, please HOLD on the morning of the test. Patients who wear a continuous glucose monitor MUST remove the device prior to scanning. FEMALES- please wear underwire-free bra if available, avoid dresses & tight clothing  After the Test: Drink plenty of water. After receiving IV contrast, you may experience a mild flushed feeling. This is normal. On occasion, you may experience a mild rash up to 24 hours after the test. This is not dangerous. If this occurs, you can take Benadryl 25 mg, Zyrtec, Claritin, or Allegra and increase your fluid intake. (Patients taking Tikosyn should avoid Benadryl, and may take Zyrtec, Claritin, or Allegra) If you experience trouble breathing, this can be serious. If it is severe call 911  IMMEDIATELY. If it is mild, please call our office.  We will call to schedule your test 2-4 weeks out understanding that some insurance companies will need an authorization prior to the service being performed.   For more information and frequently asked questions, please visit our website : [email or web address]  For non-scheduling related questions,  please contact the cardiac imaging nurse navigator should you have any questions/concerns: Cardiac Imaging Nurse Navigators Direct Office Dial: [phone]   For scheduling needs, including cancellations and rescheduling, please call Brittany, [phone].

## 2024-05-18 ENCOUNTER — Encounter (HOSPITAL_COMMUNITY): Payer: Self-pay

## 2024-05-19 ENCOUNTER — Telehealth (HOSPITAL_COMMUNITY): Payer: Self-pay | Admitting: Emergency Medicine

## 2024-05-19 NOTE — Telephone Encounter (Signed)
 Attempted to call patient regarding upcoming cardiac CT appointment. Left message on voicemail with name and callback number Rockwell Alexandria RN Navigator Cardiac Imaging [facility] [phone] Office [phone] Cell

## 2024-05-20 ENCOUNTER — Other Ambulatory Visit: Payer: Self-pay | Admitting: Internal Medicine

## 2024-05-20 ENCOUNTER — Ambulatory Visit
Admission: RE | Admit: 2024-05-20 | Discharge: 2024-05-20 | Disposition: A | Source: Ambulatory Visit | Attending: Cardiovascular Disease | Admitting: Cardiovascular Disease

## 2024-05-20 DIAGNOSIS — R072 Precordial pain: Secondary | ICD-10-CM | POA: Diagnosis not present

## 2024-05-20 MED ORDER — IOHEXOL 350 MG/ML SOLN
100.0000 mL | Freq: Once | INTRAVENOUS | Status: AC | PRN
  Administered 2024-05-20: 100 mL via INTRAVENOUS

## 2024-05-20 MED ORDER — NITROGLYCERIN 0.4 MG SL SUBL
0.8000 mg | SUBLINGUAL_TABLET | Freq: Once | SUBLINGUAL | Status: AC
  Administered 2024-05-20: 0.8 mg via SUBLINGUAL
  Filled 2024-05-20: qty 25

## 2024-05-20 NOTE — Progress Notes (Signed)
 Patient tolerated procedure well. W/C to lobby.  Ambulate w/o difficulty. Denies light headedness or being dizzy. Encouraged to drink extra water today and reasoning explained. Verbalized understanding. All questions answered. ABC intact. No further needs. Discharge from procedure area w/o issues.

## 2024-05-25 ENCOUNTER — Ambulatory Visit: Attending: Cardiovascular Disease | Admitting: Cardiovascular Disease

## 2024-05-25 ENCOUNTER — Encounter: Payer: Self-pay | Admitting: Cardiovascular Disease

## 2024-05-25 VITALS — BP 108/60 | HR 67 | Ht 63.5 in | Wt 135.2 lb

## 2024-05-25 DIAGNOSIS — R002 Palpitations: Secondary | ICD-10-CM

## 2024-05-25 DIAGNOSIS — E785 Hyperlipidemia, unspecified: Secondary | ICD-10-CM

## 2024-05-25 NOTE — Patient Instructions (Signed)
 Medication Instructions:  STOP the Pantoprazole  (Protonix )  *If you need a refill on your cardiac medications before your next appointment, please call your pharmacy*  Lab Work: None ordered If you have labs (blood work) drawn today and your tests are completely normal, you will receive your results only by: MyChart Message (if you have MyChart) OR A paper copy in the mail If you have any lab test that is abnormal or we need to change your treatment, we will call you to review the results.  Testing/Procedures: None ordered  Follow-Up: At [facility], you and your health needs are our priority.  As part of our continuing mission to provide you with exceptional heart care, our providers are all part of one team.  This team includes your primary Cardiologist (physician) and Advanced Practice Providers or APPs (Physician Assistants and Nurse Practitioners) who all work together to provide you with the care you need, when you need it.  Your next appointment:   12 month(s)  Provider:   You may see Deatrice Cage, MD or one of the following Advanced Practice Providers on your designated Care Team:   Lonni Meager, NP Lesley Maffucci, PA-C Bernardino Bring, PA-C Cadence [place], PA-C Tylene Lunch, NP Barnie Hila, NP    We recommend signing up for the patient portal called MyChart.  Sign up information is provided on this After Visit Summary.  MyChart is used to connect with patients for Virtual Visits (Telemedicine).  Patients are able to view lab/test results, encounter notes, upcoming appointments, etc.  Non-urgent messages can be sent to your provider as well.   To learn more about what you can do with MyChart, go to [email or web address].

## 2024-05-25 NOTE — Progress Notes (Unsigned)
 Cardiology Office Note   Date:  05/25/2024   ID:  [name], DOB 1955/09/24, MRN [id number]  PCP:  Marylynn Verneita CROME, MD  Cardiologist:   Deatrice Cage, MD   Chief Complaint  Patient presents with   Follow-up    1 month f/u CT no complaints today. Meds reviewed verbally with pt.      History of Present Illness: Melody Hansen is a 68 y.o. female who is here today for a follow-up visit regarding chest pain.   She has known history of paroxysmal supraventricular tachycardia status post successful ablation in March 2015.   She had CT calcium score in 2023 which was 0. She had worsening palpitations in 2024.  Outpatient monitor showed only 1 run of short SVT.  Echocardiogram showed normal LV systolic function with no significant valvular abnormalities.  She was seen recently for a prolonged episode of substernal chest pain and tightness that lasted for about an hour and was associated with tachycardia.  Stat troponin and D-dimer were both normal. She underwent cardiac CTA which showed no significant coronary artery disease with a calcium score of 0.9.  She was prescribed pantoprazole  for presumed GERD.  She reports no recurrent symptoms.  She resumed all her activities without significant limitations.   Past Medical History:  Diagnosis Date   Attention or concentration deficit 06/27/2021   Cervicalgia 04/21/2015   Closed fracture of multiple ribs of right side with delayed healing 05/23/2016   Hyperlipidemia    Impingement syndrome of right shoulder 02/23/2016   Migraine headache 11/20/2016   Osteoporosis 04/15/2020   Bone Density scores received, she has bone loss which is significant  I recommend treating with medication but would need to discuss the pros and cons in an office visit continue calcium 1200 to 1800 mg daily through diet and supplements ,  2000 units of vitamin d  and weight bearing exercise on a regular basis, and make appt to discuss    PMB  (postmenopausal bleeding) 06/23/2019   Postmenopausal atrophic vaginitis 08/23/2017   Postural dizziness with presyncope 08/05/2022   PSVT (paroxysmal supraventricular tachycardia)    Recurrent headache 07/26/2017   Shingles 07/05/2014   Streptococcal pharyngitis 08/05/2022   Urethral polyp 06/23/2019   Vitamin D  deficiency 05/23/2016    Past Surgical History:  Procedure Laterality Date   APPENDECTOMY     AUGMENTATION MAMMAPLASTY Bilateral    25 years ago   BREAST ENHANCEMENT SURGERY     CARDIAC ELECTROPHYSIOLOGY STUDY AND ABLATION  March 2015   [place] for SVT    OVARIAN CYST REMOVAL     removal calcification right sternoclavicular joint     TONSILLECTOMY       Current Outpatient Medications  Medication Sig Dispense Refill   ALPRAZolam  (XANAX ) 0.25 MG tablet Take 1 tablet (0.25 mg total) by mouth daily as needed for anxiety. 20 tablet 0   escitalopram  (LEXAPRO ) 5 MG tablet TAKE 1 TABLET (5 MG TOTAL) BY MOUTH DAILY. 90 tablet 3   lactobacillus acidophilus (BACID) TABS tablet Take 1 tablet by mouth at bedtime.     Loratadine (ALAVERT PO) Take 1 tablet by mouth as needed.     Multiple Vitamin (MULTIVITAMIN) tablet Take 1 tablet by mouth daily.     naratriptan  (AMERGE) 2.5 MG tablet TAKE 1 TABLET BY MOUTH DAILY AS NEEDED FOR MIGRAINE. MAY REPEAT ONCE IN 4 HOURS IF NEEDED. MAX 2/DAY 10 tablet 5   pantoprazole  (PROTONIX ) 40 MG tablet Take 1 tablet (40 mg  total) by mouth daily. 90 tablet 3   raloxifene  (EVISTA ) 60 MG tablet TAKE 1 TABLET BY MOUTH EVERY DAY 90 tablet 1   atomoxetine (STRATTERA) 100 MG capsule Take 100 mg by mouth every morning. (Patient not taking: Reported on 05/25/2024)     azithromycin  (ZITHROMAX ) 500 MG tablet Take 1 tablet (500 mg total) by mouth daily. (Patient not taking: Reported on 05/25/2024) 7 tablet 0   fluticasone  (FLONASE ) 50 MCG/ACT nasal spray Place 2 sprays into both nostrils daily. (Patient not taking: Reported on 05/25/2024) 16 g 0    metoprolol  tartrate (LOPRESSOR ) 50 MG tablet Take one tablet two hour prior to the test (Patient not taking: Reported on 05/25/2024) 1 tablet 0   predniSONE  (DELTASONE ) 10 MG tablet 6 tablets on Day 1 , then reduce by 1 tablet daily until gone (Patient not taking: Reported on 05/25/2024) 21 tablet 0   No current facility-administered medications for this visit.    Allergies:   Penicillins    Social History:  The patient  reports that she has never smoked. She has never used smokeless tobacco. She reports current alcohol use of about 4.0 - 8.0 standard drinks of alcohol per week. She reports that she does not use drugs.   Family History:  The patient's family history includes COPD in her father and mother; Heart attack in her mother; Heart disease (age of onset: 76) in her father; Hyperlipidemia in her brother, father, and another family member; Obesity in her brother; Stroke in her father.    ROS:  Please see the history of present illness.   Otherwise, review of systems are positive for none.   All other systems are reviewed and negative.    PHYSICAL EXAM: VS:  BP 108/60 (BP Location: Left Arm, Patient Position: Sitting, Cuff Size: Normal)   Pulse 67   Ht 5' 3.5 (1.613 m)   Wt 135 lb 4 oz (61.3 kg)   SpO2 96%   BMI 23.58 kg/m  , BMI Body mass index is 23.58 kg/m. GEN: Well nourished, well developed, in no acute distress  HEENT: normal  Neck: no JVD, carotid bruits, or masses Cardiac: RRR; no murmurs, rubs, or gallops,no edema  Respiratory:  clear to auscultation bilaterally, normal work of breathing GI: soft, nontender, nondistended, + BS MS: no deformity or atrophy  Skin: warm and dry, no rash Neuro:  Strength and sensation are intact Psych: euthymic mood, full affect   EKG:  EKG is  ordered today. EKG showed: Normal sinus rhythm Normal ECG When compared with ECG of 22-Apr-2024 10:16, T wave inversion no longer evident in Anterior leads        Recent Labs: 04/22/2024:  ALT 18; BUN 22; Creatinine, Ser 0.56; Hemoglobin 13.5; Platelets 224; Potassium 4.4; Sodium 140    Lipid Panel    Component Value Date/Time   CHOL 250 (H) 04/22/2024 1105   TRIG 86 04/22/2024 1105   HDL 96 04/22/2024 1105   CHOLHDL 2.6 04/22/2024 1105   VLDL 17 04/22/2024 1105   LDLCALC 137 (H) 04/22/2024 1105   LDLDIRECT 160.0 03/03/2023 1226      Wt Readings from Last 3 Encounters:  05/25/24 135 lb 4 oz (61.3 kg)  04/22/24 132 lb 3.2 oz (60 kg)  10/13/23 125 lb 6.4 oz (56.9 kg)          10/30/2021    2:47 PM  PAD Screen  Previous PAD dx? No  Previous surgical procedure? No  Pain with walking? No  Feet/toe  relief with dangling? No  Painful, non-healing ulcers? No  Extremities discolored? No      ASSESSMENT AND PLAN:  1.  Chest pain: Likely of noncardiac etiology.  Cardiac CTA showed no significant coronary artery disease.    2.  Palpitations: Previous history of SVT status post ablation.  Outpatient monitor last year showed only 1 short run of SVT that lasted 5 beats.  No significant symptoms at the present time.  3.  Hyperlipidemia: LDL was 137.  CTA showed near normal coronary arteries.  Continue healthy lifestyle changes.  4.  Possible GERD: She took pantoprazole  for 1 month and will discontinue now.   Disposition:   FU in 12 months.  Signed,  Deatrice Cage, MD  05/25/2024 3:32 PM    [facility]

## 2024-06-02 ENCOUNTER — Encounter: Payer: Self-pay | Admitting: Internal Medicine

## 2024-06-02 ENCOUNTER — Ambulatory Visit (INDEPENDENT_AMBULATORY_CARE_PROVIDER_SITE_OTHER): Admitting: Internal Medicine

## 2024-06-02 VITALS — BP 104/60 | HR 84 | Ht 63.5 in | Wt 134.2 lb

## 2024-06-02 DIAGNOSIS — B009 Herpesviral infection, unspecified: Secondary | ICD-10-CM

## 2024-06-02 DIAGNOSIS — R1319 Other dysphagia: Secondary | ICD-10-CM

## 2024-06-02 DIAGNOSIS — J029 Acute pharyngitis, unspecified: Secondary | ICD-10-CM | POA: Diagnosis not present

## 2024-06-02 DIAGNOSIS — S52501S Unspecified fracture of the lower end of right radius, sequela: Secondary | ICD-10-CM | POA: Diagnosis not present

## 2024-06-02 DIAGNOSIS — M8000XS Age-related osteoporosis with current pathological fracture, unspecified site, sequela: Secondary | ICD-10-CM | POA: Diagnosis not present

## 2024-06-02 DIAGNOSIS — S52501A Unspecified fracture of the lower end of right radius, initial encounter for closed fracture: Secondary | ICD-10-CM | POA: Diagnosis not present

## 2024-06-02 DIAGNOSIS — Z9882 Breast implant status: Secondary | ICD-10-CM

## 2024-06-02 DIAGNOSIS — M25559 Pain in unspecified hip: Secondary | ICD-10-CM

## 2024-06-02 DIAGNOSIS — Z23 Encounter for immunization: Secondary | ICD-10-CM

## 2024-06-02 DIAGNOSIS — J358 Other chronic diseases of tonsils and adenoids: Secondary | ICD-10-CM | POA: Diagnosis not present

## 2024-06-02 DIAGNOSIS — I471 Supraventricular tachycardia, unspecified: Secondary | ICD-10-CM | POA: Diagnosis not present

## 2024-06-02 LAB — CBC WITH DIFFERENTIAL/PLATELET
Basophils Absolute: 0.1 K/uL (ref 0.0–0.1)
Basophils Relative: 1.4 % (ref 0.0–3.0)
Eosinophils Absolute: 0.1 K/uL (ref 0.0–0.7)
Eosinophils Relative: 1.4 % (ref 0.0–5.0)
HCT: 38.4 % (ref 36.0–46.0)
Hemoglobin: 12.8 g/dL (ref 12.0–15.0)
Lymphocytes Relative: 22.2 % (ref 12.0–46.0)
Lymphs Abs: 1.5 K/uL (ref 0.7–4.0)
MCHC: 33.5 g/dL (ref 30.0–36.0)
MCV: 84.8 fl (ref 78.0–100.0)
Monocytes Absolute: 0.6 K/uL (ref 0.1–1.0)
Monocytes Relative: 8.3 % (ref 3.0–12.0)
Neutro Abs: 4.5 K/uL (ref 1.4–7.7)
Neutrophils Relative %: 66.7 % (ref 43.0–77.0)
Platelets: 245 K/uL (ref 150.0–400.0)
RBC: 4.52 Mil/uL (ref 3.87–5.11)
RDW: 13.9 % (ref 11.5–15.5)
WBC: 6.7 K/uL (ref 4.0–10.5)

## 2024-06-02 LAB — COMPREHENSIVE METABOLIC PANEL WITH GFR
ALT: 17 U/L (ref 3–35)
AST: 23 U/L (ref 5–37)
Albumin: 4.4 g/dL (ref 3.5–5.2)
Alkaline Phosphatase: 52 U/L (ref 39–117)
BUN: 15 mg/dL (ref 6–23)
CO2: 33 meq/L — ABNORMAL HIGH (ref 19–32)
Calcium: 9.9 mg/dL (ref 8.4–10.5)
Chloride: 101 meq/L (ref 96–112)
Creatinine, Ser: 0.48 mg/dL (ref 0.40–1.20)
GFR: 95.68 mL/min (ref >60.00)
Glucose, Bld: 100 mg/dL — ABNORMAL HIGH (ref 70–99)
Potassium: 4.5 meq/L (ref 3.5–5.1)
Sodium: 139 meq/L (ref 135–145)
Total Bilirubin: 0.6 mg/dL (ref 0.2–1.2)
Total Protein: 6.7 g/dL (ref 6.0–8.3)

## 2024-06-02 LAB — POCT RAPID STREP A (OFFICE): Rapid Strep A Screen: NEGATIVE

## 2024-06-02 LAB — SEDIMENTATION RATE: Sed Rate: 9 mm/h (ref 0–30)

## 2024-06-02 MED ORDER — PREDNISONE 10 MG PO TABS: ORAL_TABLET | ORAL | 0 refills | Status: AC

## 2024-06-02 MED ORDER — SOLIFENACIN SUCCINATE 5 MG PO TABS: 5.0000 mg | ORAL_TABLET | Freq: Every day | ORAL | 1 refills | Status: AC

## 2024-06-02 MED ORDER — AZITHROMYCIN 500 MG PO TABS: 500.0000 mg | ORAL_TABLET | Freq: Every day | ORAL | 0 refills | Status: AC

## 2024-06-02 NOTE — Patient Instructions (Signed)
 Your strep test was negative, but I am treating you anyway with azithromycin   Please resume alavert on a daily basis to see if the cough resolves  Do not start the prednisone  until Saturday, and only start if your symptoms are not improving with alavert and azithromycin     I am ordering a DG esophagus to evaluate your swallowing issues.   This is an x ray of your esophagus during different phases of swallowing.  You will be asked to drink a chalky substance and pictures will be taken during your swallowing to see if there is a stricture.  Please call [phone] to set this up at [facility]     Your  DEXA scan  been ordered.  Please call to schedule your own  appointment at [facility]  , and their phone number is [phone]

## 2024-06-02 NOTE — Progress Notes (Unsigned)
 Subjective:  Patient ID: Melody Hansen, female    DOB: Jun 26, 1955  Age: 68 y.o. MRN: [id number]  CC: There were no encounter diagnoses.   HPI Melody Hansen presents for  Chief Complaint  Patient presents with   Medical Management of Chronic Issues   1) ATYPICAL CHEST PAIN: NON INVASVIE CARDIAC WORKUP WAS NEGATIVE;  CAC SCORE 0.9 (40TH PERCENTILE)    DEC 2025  ARIDA.   2) OSTEOPENIA WITH RIGHT WRIST FRACTURE MARCH 2025: NEEDS DEXA  3) FUNCTIONAL DIARRHEA: workup started in may,  negative infectious workup,  no weight loss.   Diarrhea has resolved.    4) low back pain   seeing chiropractor   for manipulation  x rays negative. Using kinetic tape   . Told the issue was muscular. Melody Hansen Has been pushing around a lot of furniture.    5)  dry cough , persistent, not harsh,    for   while  at least 8 weeks,  now for the last week has pain on right side with swallowing , and notes that if she presses  on right side of neck it makes her cough .  Melody Hansen Has had to moisten food to facilitate passage,  has been problematic for years with out prior workup.  Has chronic sinus congestion treated with occasional alavert.  (Claritin)  Traveled in September to [place] returned sooner than anticipated due to [facility] fathers;s death.  Symptoms were present before the trip   Most recent trip was to [place] Nov 17 to Nov 23 ,  visited a few local bars   6) OAB   7)   Outpatient Medications Prior to Visit  Medication Sig Dispense Refill   ALPRAZolam  (XANAX ) 0.25 MG tablet Take 1 tablet (0.25 mg total) by mouth daily as needed for anxiety. 20 tablet 0   escitalopram  (LEXAPRO ) 5 MG tablet TAKE 1 TABLET (5 MG TOTAL) BY MOUTH DAILY. 90 tablet 3   lactobacillus acidophilus (BACID) TABS tablet Take 1 tablet by mouth at bedtime.     Loratadine (ALAVERT PO) Take 1 tablet by mouth as needed.     Multiple Vitamin (MULTIVITAMIN) tablet Take 1 tablet by mouth daily.     naratriptan  (AMERGE) 2.5 MG tablet TAKE 1  TABLET BY MOUTH DAILY AS NEEDED FOR MIGRAINE. MAY REPEAT ONCE IN 4 HOURS IF NEEDED. MAX 2/DAY 10 tablet 5   raloxifene  (EVISTA ) 60 MG tablet TAKE 1 TABLET BY MOUTH EVERY DAY 90 tablet 1   atomoxetine (STRATTERA) 100 MG capsule Take 100 mg by mouth every morning. (Patient not taking: Reported on 06/02/2024)     azithromycin  (ZITHROMAX ) 500 MG tablet Take 1 tablet (500 mg total) by mouth daily. (Patient not taking: Reported on 05/25/2024) 7 tablet 0   fluticasone  (FLONASE ) 50 MCG/ACT nasal spray Place 2 sprays into both nostrils daily. (Patient not taking: Reported on 05/25/2024) 16 g 0   metoprolol  tartrate (LOPRESSOR ) 50 MG tablet Take one tablet two hour prior to the test (Patient not taking: Reported on 05/25/2024) 1 tablet 0   predniSONE  (DELTASONE ) 10 MG tablet 6 tablets on Day 1 , then reduce by 1 tablet daily until gone (Patient not taking: Reported on 05/25/2024) 21 tablet 0   No facility-administered medications prior to visit.    Review of Systems;  Patient denies headache, fevers, malaise, unintentional weight loss, skin rash, eye pain, sinus congestion and sinus pain, sore throat, dysphagia,  hemoptysis , cough, dyspnea, wheezing, chest pain, palpitations, orthopnea, edema, abdominal pain,  nausea, melena, diarrhea, constipation, flank pain, dysuria, hematuria, urinary  Frequency, nocturia, numbness, tingling, seizures,  Focal weakness, Loss of consciousness,  Tremor, insomnia, depression, anxiety, and suicidal ideation.      Objective:  BP 104/60   Pulse 84   Ht 5' 3.5 (1.613 m)   Wt 134 lb 3.2 oz (60.9 kg)   SpO2 96%   BMI 23.40 kg/m   BP Readings from Last 3 Encounters:  06/02/24 104/60  05/25/24 108/60  05/20/24 (!) 96/57    Wt Readings from Last 3 Encounters:  06/02/24 134 lb 3.2 oz (60.9 kg)  05/25/24 135 lb 4 oz (61.3 kg)  04/22/24 132 lb 3.2 oz (60 kg)    Physical Exam  Lab Results  Component Value Date   HGBA1C 5.6 03/03/2023   HGBA1C 5.8 01/01/2022    HGBA1C 5.5 05/22/2016    Lab Results  Component Value Date   CREATININE 0.56 04/22/2024   CREATININE 0.50 10/13/2023   CREATININE 0.53 03/03/2023    Lab Results  Component Value Date   WBC 7.7 04/22/2024   HGB 13.5 04/22/2024   HCT 41.2 04/22/2024   PLT 224 04/22/2024   GLUCOSE 106 (H) 04/22/2024   CHOL 250 (H) 04/22/2024   TRIG 86 04/22/2024   HDL 96 04/22/2024   LDLDIRECT 160.0 03/03/2023   LDLCALC 137 (H) 04/22/2024   ALT 18 04/22/2024   AST 24 04/22/2024   NA 140 04/22/2024   K 4.4 04/22/2024   CL 102 04/22/2024   CREATININE 0.56 04/22/2024   BUN 22 04/22/2024   CO2 30 04/22/2024   TSH 0.57 03/03/2023   INR 0.9 06/23/2019   HGBA1C 5.6 03/03/2023    CT CORONARY MORPH W/CTA COR W/SCORE W/CA W/CM &/OR WO/CM Addendum Date: 06/01/2024 ADDENDUM REPORT: 06/01/2024 13:42 EXAM: OVER-READ INTERPRETATION  CT CHEST The following report is an over-read performed by radiologist Dr. Andrea Gasman of [facility] on 06/01/2024. This over-read does not include interpretation of cardiac or coronary anatomy or pathology. The coronary CTA interpretation by the cardiologist is attached. COMPARISON:  Cardiac CT 11/28/2021 FINDINGS: Vascular: No aortic atherosclerosis. The included aorta is normal in caliber. Mediastinum/nodes: No adenopathy or mass. Unremarkable esophagus. Lungs: Bandlike scarring in the left lower lobe. Tiny subpleural nodule in the left lower lobe, series 13, image 10 is stable from prior exam and considered definitively benign. No new pulmonary nodule. No pleural fluid. The included airways are patent. Upper abdomen: No acute or unexpected findings. Musculoskeletal: There are no acute or suspicious osseous abnormalities. Bilateral breast implants. IMPRESSION: No acute or unexpected extracardiac findings. Electronically Signed   By: Andrea Gasman M.D.   On: 06/01/2024 13:42   Result Date: 06/01/2024 CLINICAL DATA:  Chest pain EXAM: Cardiac/Coronary  CTA  TECHNIQUE: The patient was scanned on a Siemens Somatom scanner. : A prospective scan was triggered in the ascending thoracic aorta. Axial non-contrast 3 mm slices were carried out through the heart. The data set was analyzed on a dedicated work station and scored using the Agatson method. Gantry rotation speed was 66 msecs and collimation was .6 mm. 100mg  of metoprolol  and 0.8 mg of sl NTG was given. The 3D data set was reconstructed in 5% intervals of the 60-95 % of the R-R cycle. Diastolic phases were analyzed on a dedicated work station using MPR, MIP and VRT modes. The patient received 100 cc of contrast. FINDINGS: Aorta:  Normal size.  No calcifications.  No dissection. Aortic Valve:  Trileaflet.  No calcifications.  Coronary Arteries:  Normal coronary origin.  Right dominance. RCA is a dominant artery. There is no plaque. Left main gives rise to LAD and LCX arteries. LM has no disease. LAD has minimal calcification proximally causing minimal (<25%) stenosis . LCX is a non-dominant artery.  There is no plaque. Other findings: Normal pulmonary vein drainage into the left atrium. Normal left atrial appendage without a thrombus. Normal size of the pulmonary artery. IMPRESSION: 1. Coronary calcium score of 0.9. This was 40th percentile for age and sex matched control. 2. Normal coronary origin with right dominance. 3. Minimal LAD stenosis (<25%). 4. CAD-RADS 1. Minimal non-obstructive CAD (0-24%). Consider non-atherosclerotic causes of chest pain. Consider preventive therapy and risk factor modification. Electronically Signed: By: Redell Cave M.D. On: 05/20/2024 14:27    Assessment & Plan:  .There are no diagnoses linked to this encounter.   I spent 34 minutes on the day of this face to face encounter reviewing patient's  most recent visit with cardiology,  nephrology,  and neurology,  prior relevant surgical and non surgical procedures, recent  labs and imaging studies, counseling on weight  management,  reviewing the assessment and plan with patient, and post visit ordering and reviewing of  diagnostics and therapeutics with patient  .   Follow-up: No follow-ups on file.   Verneita LITTIE Kettering, MD

## 2024-06-03 LAB — HSV 1 AND 2 AB, IGG
HSV 1 Glycoprotein G Ab, IgG: REACTIVE — AB
HSV 2 IgG, Type Spec: NONREACTIVE

## 2024-06-04 ENCOUNTER — Ambulatory Visit: Payer: Self-pay | Admitting: Internal Medicine

## 2024-06-04 ENCOUNTER — Encounter: Payer: Self-pay | Admitting: Internal Medicine

## 2024-06-04 DIAGNOSIS — B009 Herpesviral infection, unspecified: Secondary | ICD-10-CM | POA: Insufficient documentation

## 2024-06-04 DIAGNOSIS — J358 Other chronic diseases of tonsils and adenoids: Secondary | ICD-10-CM | POA: Insufficient documentation

## 2024-06-04 MED ORDER — VALACYCLOVIR HCL 1 G PO TABS: 1000.0000 mg | ORAL_TABLET | Freq: Two times a day (BID) | ORAL | 0 refills | Status: AC

## 2024-06-04 NOTE — Assessment & Plan Note (Signed)
 Screening for inflammatory forms of arthritis was normal in 2024; repeat ESR is normal   Lab Results  Component Value Date   ESRSEDRATE 9 06/02/2024

## 2024-06-04 NOTE — Assessment & Plan Note (Addendum)
 Noted on today's exam , with right sided neck pain.  POC strep test was negative but I treated empirically  with azithromycin  and prednisone . Screening for HSV given recent travel to [place] was POSITIVE for HSV 1 , likely a primary infection.  Treatment recommended with valacyclovir  1000 mg bid x 10 days

## 2024-06-04 NOTE — Assessment & Plan Note (Addendum)
 Chronic per patient .  DG esophagus was ordered last year but not done . now with pain on right side of neck and ulceration of right tonsil noted.   DG esophagus ordered

## 2024-06-04 NOTE — Assessment & Plan Note (Signed)
 No significant change by repeat DEXA 2023 .  Has been taking Raloxifene  since Dec 2021 but has had a radial fracture in March 2025 . Repeat DEXA ordered

## 2024-06-21 ENCOUNTER — Telehealth

## 2024-06-21 DIAGNOSIS — J019 Acute sinusitis, unspecified: Secondary | ICD-10-CM | POA: Diagnosis not present

## 2024-06-21 DIAGNOSIS — B9689 Other specified bacterial agents as the cause of diseases classified elsewhere: Secondary | ICD-10-CM

## 2024-06-21 NOTE — Progress Notes (Signed)
 " Virtual Visit Consent   Melody Hansen, you are scheduled for a virtual visit with a [place] provider today. Just as with appointments in the office, your consent must be obtained to participate. Your consent will be active for this visit and any virtual visit you may have with one of our providers in the next 365 days. If you have a MyChart account, a copy of this consent can be sent to you electronically.  As this is a virtual visit, video technology does not allow for your provider to perform a traditional examination. This may limit your provider's ability to fully assess your condition. If your provider identifies any concerns that need to be evaluated in person or the need to arrange testing (such as labs, EKG, etc.), we will make arrangements to do so. Although advances in technology are sophisticated, we cannot ensure that it will always work on either your end or our end. If the connection with a video visit is poor, the visit may have to be switched to a telephone visit. With either a video or telephone visit, we are not always able to ensure that we have a secure connection.  By engaging in this virtual visit, you consent to the provision of healthcare and authorize for your insurance to be billed (if applicable) for the services provided during this visit. Depending on your insurance coverage, you may receive a charge related to this service.  I need to obtain your verbal consent now. Are you willing to proceed with your visit today? Payal Stanforth has provided verbal consent on 06/21/2024 for a virtual visit (video or telephone). Jon CHRISTELLA Belt, NP  Date: 06/21/2024 8:36 AM   Virtual Visit via Video Note   I, Jon CHRISTELLA Belt, connected with  Melody Hansen  ([id number], 05/11/1956) on 06/21/2024 at  8:30 AM EST by a video-enabled telemedicine application and verified that I am speaking with the correct person using two identifiers.  Location: Patient: Virtual Visit Location  Patient: Home Provider: Virtual Visit Location Provider: Home Office   I discussed the limitations of evaluation and management by telemedicine and the availability of in person appointments. The patient expressed understanding and agreed to proceed.    History of Present Illness: Melody Hansen is a 69 y.o. who identifies as a female who was assigned female at birth, and is being seen today for possible sinus infection.    Chills, no fever. Upper teeth hurt. B max sinus pressure.   Saw PCP in mid December, had throat swelling and drainage, was prescribed azithromycin  500mg  po daily for 7 days - only took 1 day when she was told to stop as fever blister test came back positive. STarted on valtrex .  Still has post nasal drainage, is blowing yellow discharge out of her nose.   Taking alavert.   HPI: HPI  Problems:  Patient Active Problem List   Diagnosis Date Noted   Tonsil ulcer 06/04/2024   HSV (herpes simplex virus) infection 06/04/2024   History of bilateral breast implants 06/02/2024   Joint pain 10/13/2023   Polyarthritis 10/13/2023   Closed fracture of distal end of right radius 08/12/2023   Bloating symptom 03/04/2023   Livedo reticularis without ulceration 03/03/2023   Esophageal dysphagia 08/13/2022   Hyperinflation of lungs 08/12/2022   Skin neoplasm 01/01/2022   Cerebral vascular disease 09/11/2021   Attention or concentration deficit 06/27/2021   Osteoporosis 04/15/2020   Urethral polyp 06/23/2019   Postmenopausal atrophic vaginitis 08/23/2017   Vitamin D  deficiency  05/23/2016   Impingement syndrome of right shoulder 02/23/2016   Cervicalgia 04/21/2015   Encounter for general adult medical examination with abnormal findings 08/13/2014   Shingles 07/05/2014   PSVT (paroxysmal supraventricular tachycardia)    Hyperlipidemia     Allergies: Allergies[1] Medications: Current Medications[2]  Observations/Objective: Patient is well-developed, well-nourished in  no acute distress.  Resting comfortably  at home.  Head is normocephalic, atraumatic.  No labored breathing.  Speech is clear and coherent with logical content.  Patient is alert and oriented at baseline.    Assessment and Plan: 1. Acute bacterial sinusitis (Primary)  She still has azithromycin  500mg  pills x6. She will take 1 daily until they are gone to treat her sinus infection rather than me prescribing a zpak.   She will also take mucinex and use saline nasal spray  Follow Up Instructions: I discussed the assessment and treatment plan with the patient. The patient was provided an opportunity to ask questions and all were answered. The patient agreed with the plan and demonstrated an understanding of the instructions.  A copy of instructions were sent to the patient via MyChart unless otherwise noted below.   The patient was advised to call back or seek an in-person evaluation if the symptoms worsen or if the condition fails to improve as anticipated.    Jon CHRISTELLA Belt, NP    [1]  Allergies Allergen Reactions   Penicillins Hives    Has patient had a PCN reaction causing immediate rash, facial/tongue/throat swelling, SOB or lightheadedness with hypotension: Yes Has patient had a PCN reaction causing severe rash involving mucus membranes or skin necrosis: No Has patient had a PCN reaction that required hospitalization: No Has patient had a PCN reaction occurring within the last 10 years: No If all of the above answers are NO, then may proceed with Cephalosporin use.  [2]  Current Outpatient Medications:    ALPRAZolam  (XANAX ) 0.25 MG tablet, Take 1 tablet (0.25 mg total) by mouth daily as needed for anxiety., Disp: 20 tablet, Rfl: 0   azithromycin  (ZITHROMAX ) 500 MG tablet, Take 1 tablet (500 mg total) by mouth daily., Disp: 7 tablet, Rfl: 0   escitalopram  (LEXAPRO ) 5 MG tablet, TAKE 1 TABLET (5 MG TOTAL) BY MOUTH DAILY., Disp: 90 tablet, Rfl: 3   lactobacillus acidophilus  (BACID) TABS tablet, Take 1 tablet by mouth at bedtime., Disp: , Rfl:    Loratadine (ALAVERT PO), Take 1 tablet by mouth as needed., Disp: , Rfl:    Multiple Vitamin (MULTIVITAMIN) tablet, Take 1 tablet by mouth daily., Disp: , Rfl:    naratriptan  (AMERGE) 2.5 MG tablet, TAKE 1 TABLET BY MOUTH DAILY AS NEEDED FOR MIGRAINE. MAY REPEAT ONCE IN 4 HOURS IF NEEDED. MAX 2/DAY, Disp: 10 tablet, Rfl: 5   predniSONE  (DELTASONE ) 10 MG tablet, 6 tablets on Day 1 , then reduce by 1 tablet daily until gone, Disp: 21 tablet, Rfl: 0   raloxifene  (EVISTA ) 60 MG tablet, TAKE 1 TABLET BY MOUTH EVERY DAY, Disp: 90 tablet, Rfl: 1   solifenacin  (VESICARE ) 5 MG tablet, Take 1 tablet (5 mg total) by mouth daily., Disp: 90 tablet, Rfl: 1  "

## 2024-06-21 NOTE — Patient Instructions (Signed)
" °  Avelina Founds, thank you for joining Jon CHRISTELLA Belt, NP for today's virtual visit.  While this provider is not your primary care provider (PCP), if your PCP is located in our provider database this encounter information will be shared with them immediately following your visit.   A [facility] MyChart account gives you access to today's visit and all your visits, tests, and labs performed at [facility]  click here if you don't have a [facility] MyChart account or go to [email or web address]  Consent: (Patient) Jimmie Dattilio provided verbal consent for this virtual visit at the beginning of the encounter.  Current Medications:  Current Outpatient Medications:    ALPRAZolam  (XANAX ) 0.25 MG tablet, Take 1 tablet (0.25 mg total) by mouth daily as needed for anxiety., Disp: 20 tablet, Rfl: 0   azithromycin  (ZITHROMAX ) 500 MG tablet, Take 1 tablet (500 mg total) by mouth daily., Disp: 7 tablet, Rfl: 0   escitalopram  (LEXAPRO ) 5 MG tablet, TAKE 1 TABLET (5 MG TOTAL) BY MOUTH DAILY., Disp: 90 tablet, Rfl: 3   lactobacillus acidophilus (BACID) TABS tablet, Take 1 tablet by mouth at bedtime., Disp: , Rfl:    Loratadine (ALAVERT PO), Take 1 tablet by mouth as needed., Disp: , Rfl:    Multiple Vitamin (MULTIVITAMIN) tablet, Take 1 tablet by mouth daily., Disp: , Rfl:    naratriptan  (AMERGE) 2.5 MG tablet, TAKE 1 TABLET BY MOUTH DAILY AS NEEDED FOR MIGRAINE. MAY REPEAT ONCE IN 4 HOURS IF NEEDED. MAX 2/DAY, Disp: 10 tablet, Rfl: 5   predniSONE  (DELTASONE ) 10 MG tablet, 6 tablets on Day 1 , then reduce by 1 tablet daily until gone, Disp: 21 tablet, Rfl: 0   raloxifene  (EVISTA ) 60 MG tablet, TAKE 1 TABLET BY MOUTH EVERY DAY, Disp: 90 tablet, Rfl: 1   solifenacin  (VESICARE ) 5 MG tablet, Take 1 tablet (5 mg total) by mouth daily., Disp: 90 tablet, Rfl: 1   Medications ordered in this encounter:  No orders of the defined types were placed in this encounter.    *If you need refills  on other medications prior to your next appointment, please contact your pharmacy*  Follow-Up: Call back or seek an in-person evaluation if the symptoms worsen or if the condition fails to improve as anticipated.  [facility] [phone]  Other Instructions  Take the remainder of your azithromycin  prescription. Take azithromycin  500mg  1 tablet orally each day until they are gone.   I also recommend using Mucinex (ok to use generic guaifenesin) and saline nasal spray to help your congestion thin out and drain.    If you have been instructed to have an in-person evaluation today at a local Urgent Care facility, please use the link below. It will take you to a list of all of our available [facility], including address, phone number and hours of operation. Please do not delay care.  [facility]  If you or a family member do not have a primary care provider, use the link below to schedule a visit and establish care. When you choose a [facility] primary care physician or advanced practice provider, you gain a long-term partner in health. Find a Primary Care Provider  Learn more about [facility]'s in-office and virtual care options: [facility] - Get Care Now  "

## 2024-07-05 ENCOUNTER — Telehealth: Payer: Self-pay

## 2024-07-05 DIAGNOSIS — J029 Acute pharyngitis, unspecified: Secondary | ICD-10-CM

## 2024-07-05 NOTE — Telephone Encounter (Signed)
 Copied from CRM [id number]. Topic: Referral - Question >> Jul 05, 2024  8:01 AM Pinkey ORN wrote: Reason for CRM: Referral Question >> Jul 05, 2024  8:05 AM Pinkey ORN wrote: Patient called, states her throat isn't getting any better although she's been on 2 different medications and that it's still bothering her really bad especially at night. Patient is wanting to know if she could be referred over to [facility], Chinita, MD).

## 2024-07-05 NOTE — Addendum Note (Signed)
 Addended by: MARYLYNN VERNEITA CROME on: 07/05/2024 08:54 AM   Modules accepted: Orders

## 2024-07-05 NOTE — Telephone Encounter (Signed)
 Patient returned call and has been made aware.

## 2024-07-05 NOTE — Telephone Encounter (Signed)
 LMTCB. Please let pt know that the ENT referral has been placed.

## 2024-07-07 ENCOUNTER — Other Ambulatory Visit: Payer: Self-pay | Admitting: Student

## 2024-07-07 DIAGNOSIS — E049 Nontoxic goiter, unspecified: Secondary | ICD-10-CM

## 2024-07-22 ENCOUNTER — Ambulatory Visit
Admission: RE | Admit: 2024-07-22 | Discharge: 2024-07-22 | Disposition: A | Source: Ambulatory Visit | Attending: Student

## 2024-07-22 DIAGNOSIS — E049 Nontoxic goiter, unspecified: Secondary | ICD-10-CM

## 2024-08-13 ENCOUNTER — Ambulatory Visit: Payer: Medicare Other
